# Patient Record
Sex: Male | Born: 1935 | Race: Black or African American | Hispanic: No | Marital: Single | Smoking: Former smoker
Health system: Southern US, Community
[De-identification: ages and names within clinical notes are randomized; demographics above are authoritative.]

## PROBLEM LIST (undated history)

## (undated) DIAGNOSIS — N400 Enlarged prostate without lower urinary tract symptoms: Secondary | ICD-10-CM

## (undated) DIAGNOSIS — E785 Hyperlipidemia, unspecified: Secondary | ICD-10-CM

## (undated) DIAGNOSIS — R001 Bradycardia, unspecified: Secondary | ICD-10-CM

## (undated) DIAGNOSIS — R39198 Other difficulties with micturition: Secondary | ICD-10-CM

## (undated) DIAGNOSIS — I10 Essential (primary) hypertension: Secondary | ICD-10-CM

## (undated) DIAGNOSIS — R0602 Shortness of breath: Secondary | ICD-10-CM

## (undated) DIAGNOSIS — C439 Malignant melanoma of skin, unspecified: Secondary | ICD-10-CM

## (undated) DIAGNOSIS — I639 Cerebral infarction, unspecified: Secondary | ICD-10-CM

## (undated) DIAGNOSIS — M1990 Unspecified osteoarthritis, unspecified site: Secondary | ICD-10-CM

## (undated) DIAGNOSIS — I2510 Atherosclerotic heart disease of native coronary artery without angina pectoris: Secondary | ICD-10-CM

## (undated) DIAGNOSIS — I70229 Atherosclerosis of native arteries of extremities with rest pain, unspecified extremity: Secondary | ICD-10-CM

## (undated) DIAGNOSIS — H409 Unspecified glaucoma: Secondary | ICD-10-CM

## (undated) DIAGNOSIS — I499 Cardiac arrhythmia, unspecified: Secondary | ICD-10-CM

## (undated) DIAGNOSIS — I998 Other disorder of circulatory system: Secondary | ICD-10-CM

## (undated) HISTORY — DX: Other disorder of circulatory system: I99.8

## (undated) HISTORY — PX: CATARACT EXTRACTION: SUR2

## (undated) HISTORY — DX: Atherosclerosis of native arteries of extremities with rest pain, unspecified extremity: I70.229

---

## 2001-07-31 ENCOUNTER — Ambulatory Visit (HOSPITAL_COMMUNITY): Admission: RE | Admit: 2001-07-31 | Discharge: 2001-07-31 | Payer: Self-pay | Admitting: Gastroenterology

## 2001-08-20 ENCOUNTER — Encounter: Admission: RE | Admit: 2001-08-20 | Discharge: 2001-08-20 | Payer: Self-pay | Admitting: Gastroenterology

## 2001-08-20 ENCOUNTER — Encounter: Payer: Self-pay | Admitting: Gastroenterology

## 2001-12-20 ENCOUNTER — Encounter: Payer: Self-pay | Admitting: Emergency Medicine

## 2001-12-20 ENCOUNTER — Emergency Department (HOSPITAL_COMMUNITY): Admission: EM | Admit: 2001-12-20 | Discharge: 2001-12-20 | Payer: Self-pay | Admitting: Emergency Medicine

## 2001-12-30 ENCOUNTER — Ambulatory Visit (HOSPITAL_COMMUNITY): Admission: RE | Admit: 2001-12-30 | Discharge: 2001-12-30 | Payer: Self-pay | Admitting: Specialist

## 2002-01-02 HISTORY — PX: OTHER SURGICAL HISTORY: SHX169

## 2002-01-09 ENCOUNTER — Encounter: Payer: Self-pay | Admitting: Internal Medicine

## 2002-01-09 ENCOUNTER — Encounter: Admission: RE | Admit: 2002-01-09 | Discharge: 2002-01-09 | Payer: Self-pay | Admitting: Internal Medicine

## 2002-01-11 ENCOUNTER — Encounter: Admission: RE | Admit: 2002-01-11 | Discharge: 2002-01-11 | Payer: Self-pay | Admitting: Internal Medicine

## 2002-01-11 ENCOUNTER — Encounter: Payer: Self-pay | Admitting: Internal Medicine

## 2002-05-21 HISTORY — PX: NM MYOCAR PERF WALL MOTION: HXRAD629

## 2002-07-03 HISTORY — PX: CARDIAC CATHETERIZATION: SHX172

## 2002-07-04 ENCOUNTER — Emergency Department (HOSPITAL_COMMUNITY): Admission: EM | Admit: 2002-07-04 | Discharge: 2002-07-04 | Payer: Self-pay | Admitting: Emergency Medicine

## 2002-09-18 ENCOUNTER — Inpatient Hospital Stay (HOSPITAL_COMMUNITY): Admission: EM | Admit: 2002-09-18 | Discharge: 2002-09-20 | Payer: Self-pay | Admitting: Emergency Medicine

## 2002-09-19 ENCOUNTER — Encounter (INDEPENDENT_AMBULATORY_CARE_PROVIDER_SITE_OTHER): Payer: Self-pay | Admitting: Cardiology

## 2002-09-19 HISTORY — PX: CARDIAC CATHETERIZATION: SHX172

## 2002-12-04 HISTORY — PX: NM MYOCAR PERF WALL MOTION: HXRAD629

## 2002-12-15 ENCOUNTER — Ambulatory Visit (HOSPITAL_COMMUNITY): Admission: RE | Admit: 2002-12-15 | Discharge: 2002-12-15 | Payer: Self-pay | Admitting: Specialist

## 2006-07-16 ENCOUNTER — Emergency Department (HOSPITAL_COMMUNITY): Admission: EM | Admit: 2006-07-16 | Discharge: 2006-07-16 | Payer: Self-pay | Admitting: Emergency Medicine

## 2009-09-09 HISTORY — PX: NM MYOCAR PERF WALL MOTION: HXRAD629

## 2009-09-28 HISTORY — PX: DOPPLER ECHOCARDIOGRAPHY: SHX263

## 2010-05-20 NOTE — Consult Note (Signed)
NAME:  Craig Martin, PETTIE NO.:  [id number]   MEDICAL RECORD NO.:  [id number]                   PATIENT TYPE:  EMS   LOCATION:  MAJO                                 FACILITY:  [facility]   PHYSICIAN:  Ralene Ok, M.D.                   DATE OF BIRTH:  [birth year]   DATE OF CONSULTATION:  12/20/2001  DATE OF DISCHARGE:                                   CONSULTATION   HISTORY OF PRESENT ILLNESS:  The patient is a 77 year old African-American  male who was initially seen at [facility] when he was found to  have slurred speech, weakness of his left hand, and possible gaze  abnormality, and said to have diagnosis of CVA.  The patient was placed on  aspirin 325 mg.  On arrival to the emergency room, the patient was seen by  Dr. Lynelle Doctor, who did not find any evidence of facial or ocular abnormality.  The patient's speech was not slurred and rather slow.  The patient did,  however, show some left weakness.  Consult was requested.   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  The patient has been  followed without any deficit,  had been working on a regular basis.  He also  has a past medical history of hypertension and diabetes mellitus,  hyperlipidemia.   CURRENT MEDICATIONS:  1. Hydrochlorothiazide 50 mg.  2. Lopressor 100 mg.  3. Altace 10 mg.  4. Potassium 20 mEq.  5. Glucotrol 10 mg.  6. Adalat 30 mg.  7. Glucophage 1000 mg.  8. Aspirin daily.   The patient is a rather vague historian and does admit to some weakness of  the left hand.  He says it has been rather gradual with no sudden weakness.  The weakness of the left hand was accompanied with some numbness and  tingling and was evaluated by Dr. Barbee Shropshire.  The patient also admits to some  pain in his neck.  He denies any headaches or visual disturbance.  No  history of diplopia.  The patient says his speech has remained constant, has  not noticed any facial asymmetry, and there  is no difficulty walking.  He  denies any chest pain, shortness of breath, no dizzy spells.  His balance  locution are normal.  He denies any abdominal  pain, and he has no history  of previous foot ulcers.  The patient's skin is normal.  The patient is a  nonsmoker and does not drink any alcohol.  The patient denies any known drug  allergies.   The patient was unable to say if his blood sugars have been well controlled  with his Glucophage and was unable to name all of his medications.  By the  chart, when the patient when in for a colonoscopy by Dr. Loreta Ave, states his  medications.   PHYSICAL EXAMINATION:  GENERAL:  The  patient is alert, oriented x 3.  HEENT:  There is no facial asymmetry.  Pupils are equal, round, and reactive  to light. Eyelids are normal.  There is no evidence of visual field deficit  and no evidence of diplopia.  Ear, nose, and throat appeared normal.  NECK:  Supple.  NEUROLOGIC:  Tongue was normal with no deviation.  Gag reflex was normal.  Examination of the right side grade 4, 4+ on the left side, especially in  the hand grip.  There is definite wasting of the small muscles of the left  calf.  No sensory loss noted and no fasciculations noted.  Palpation of the  elbow showed no evidence of arthritis.  Examination of the lower limbs  showed normal power and tone was also normal.  Reflexes were brisk on the  right side but plantars were flexor.  Tough with fiber was normal.  Gait was  not tested.  HEART:  Auscultation of his heart showed normal heart tones.  Rate was 70  per minute, regular, and no bruits or murmurs noted.  CHEST:  Medically clear.  ABDOMEN:  Soft, nontender.  Liver and spleen were not palpable.  RECTAL/GENITAL:  Deferred.  BACK:  No tenderness in cervical or lumbar spine.   IMPRESSION:  1. Weakness of the left hand with small muscle wasting suggesting no injury.     Need to rule out cervical myelopathy versus  neuropathy in the arm or      elbow.  No evidence of recent cerebrovascular accident on clinical exam,     and recent CT scan of the head shows no acute changes.  2. Known hypertension with normal blood pressure on this visit.  3. History of diabetes mellitus.  4. History of hyperlipidemia.   An EKG done in the emergency room shows normal sinus rhythm.  There is a  left axis deviation with no acute changes.   PLAN:  The patient will be discharged home. Will add Plavix to his  medications; however, the patient will need EEG and EMG evaluation of the  left upper lip to evaluate for peripheral nerve disease versus myelopathy.  The patient says he has a followup with Dr. Barbee Shropshire to see him in the  outpatient setting, and we will follow up with him.                                                Ralene Ok, M.D.    RM/MEDQ  D:  12/20/2001  T:  12/22/2001  Job:  [id number]

## 2010-05-20 NOTE — Discharge Summary (Signed)
Craig Martin, Craig Martin                         ACCOUNT NO.:  [id number]   MEDICAL RECORD NO.:  [id number]                   PATIENT TYPE:  INP   LOCATION:  6527                                 FACILITY:  [facility]   PHYSICIAN:  Nanetta Martin, M.D.                DATE OF BIRTH:  [birth year]   DATE OF ADMISSION:  09/18/2002  DATE OF DISCHARGE:  09/20/2002                                 DISCHARGE SUMMARY   DISCHARGE DIAGNOSES:  1. Coronary artery disease, status post catheterization with intervention,     this admission.  2. Hypertension.  3. Hyperlipidemia.  4. Non-insulin-dependent diabetes mellitus.  5. Paroxysmal atrial fibrillation, on Coumadin.  6. Normal left ventricular function.  7. History of cerebrovascular accident.   HISTORY OF PRESENT ILLNESS AND HOSPITAL COURSE:  This is a 77 year old  African American male who has a prior history of coronary artery disease,  who presented to the emergency room with complaints of pain in the left  axillary region.  He started noticing the discomfort approximately three  days ago, prior to his presentation, and also mentioned that he has not done  any upper extremity physical work to kind of account from that.  He denied  any increase in dyspnea on exertion and says that he feels more short of  breath most of the time, but it did not get worse.  He did not have a good  sleep last night secondary to pain, but there was no diaphoresis, nausea,  vomiting or, as I mentioned, dyspnea exacerbation.  At the time of  presentation, the patient did not have any complaints, but he contacted his  primary physician's office, Dr. Olene Martin, and he sent him for an  evaluation to the emergency room.   The patient was admitted to [facility] on a rule-out MI protocol  and underwent cardiac catheterization on September 19, 2002.  This  catheterization was performed by Dr. Darlin Priestly and it showed high-  grade lesion in the  proximal-to-mid-portion of the circumflex artery and  that section was stented with a ZETA stent and the patient tolerated the  procedure well and was transferred to the holding area and later to the post-  catheterization unit in a stable condition.   Blood work showed: CBC -- white blood cell count 4.5, hemoglobin 11.3,  hematocrit 32.4, platelet count 163,000; sodium 139, potassium 4.1, chloride  109, CO2 27, glucose 98, BUN 13, creatinine 1.0, calcium 8.8.  Lipid profile  showed total cholesterol 109, triglycerides 54, HDL 48, LDL 50.  Cardiac  enzymes negative x3.  Thyroid status within normal limits.   On the day of his discharge, he was assessed by Dr. Gaspar Garbe B. Little, found  to be stable from cardiovascular standpoint and discharged home with the  following recommendations:   RECOMMENDATIONS AND FOLLOWUP:  1. Avoid driving, strenuous activity and lifting greater than 5 pounds  for     three days post catheterization.  2. Continue low-salt and low-cholesterol diet.  3. He was allowed to shower, instructed to avoid rubbing about puncture site     and pat it dry.  4. He is to stop Coumadin while he is on Plavix and then after Plavix     discontinued, he may resume his Coumadin.  5. Followup with Craig Martin will be scheduled and office will     contact the patient with the appointment date.   DISCHARGE MEDICATIONS:  1. Lopressor 50 mg a b.i.d.  2. Hydrochlorothiazide 50 mg daily.  3. Kay Ciel 20 mEq daily.  4. Lipitor 10 mg daily.  5. Aspirin 81 mg daily.  6. Plavix 75 mg daily.  7. Altace 10 mg b.i.d.  8. Eye drops as above.   SPECIAL DISCHARGE INSTRUCTIONS:  The patient is to stop nifedipine.   He is to be on Plavix for one month and during that time, he will be  maintained off of Coumadin.      Craig Martin, P.A.                    Nanetta Martin, M.D.    MK/MEDQ  D:  10/21/2002  T:  10/21/2002  Job:  [id number]   cc:   Craig Martin, M.D.  [address]  [address]  [place]  [place] [address]  Fax: [phone]   Nanetta Martin, M.D.  [phone], [address]  [place]  [place] [address]  Fax: [phone]

## 2010-05-20 NOTE — Cardiovascular Report (Signed)
Craig Martin, Craig Martin                         ACCOUNT NO.:  [id number]   MEDICAL RECORD NO.:  [id number]                   PATIENT TYPE:  INP   LOCATION:  6527                                 FACILITY:  [facility]   PHYSICIAN:  Darlin Priestly, M.D.             DATE OF BIRTH:  23-Feb-1926   DATE OF PROCEDURE:  09/19/2002  DATE OF DISCHARGE:                              CARDIAC CATHETERIZATION   PROCEDURES PERFORMED:  1. Left heart catheterization.  2. Coronary angiography.  3. Left ventriculogram.  4. Left circumflex-proximal percutaneous transluminal coronary balloon     angioplasty-placement of a new coronary stent.   CARDIOLOGIST:  Darlin Priestly, M.D.   COMPLICATIONS:  None.   INDICATIONS:  Craig Martin is a 77 year old male, patient of Dr. Nanetta Batty and Dr. Garner Nash, with a history of cardiac catheterization by Dr.  Allyson Sabal on July 03, 2002 revealing scattered 60% lesions in the LAD as well as  a 90% proximal circumflex lesion.  He did have a follow up Cardiolite with  no significant ischemia.  He has been treated medically.  He has remained  asymptomatic.  He presented to [facility] office on September 18, 2002  with new onset atrial fibrillation and is now referred for repeat  catheterization with probable percutaneous intervention of the circumflex.   DESCRIPTION OF PROCEDURE:  After obtaining written informed consent the  patient was brought to the cardiac cath lab where the right and left groins  were shaved, prepped and draped in the usual sterile fashion.  ECG  monitoring was established.  Using modified Seldinger technique a #6 French  arterial sheath was inserted into the right femoral artery.  Six French  diagnostic catheter was used to perform diagnostic angiography.   This revealed a large left main with no significant disease.   The LAD is a large vessel, which courses the apex and gives rise to one  large diagonal branch.  The LAD was noted to  have sequential 60 and 40%  lesions in the mid LAD.  There is a 90% apical LAD lesion.  There is a large  first diagonal, which is calcified with an 80% ostial lesion.   The left circumflex is a large vessel coursing the AV groove and gives rise  to two obtuse marginal branches.  The AV groove was noted to be diffusely  calcified in its proximal portion.  There is a 90% proximal left circumflex  lesion prior to the takeoff of the first diagonal.  There is 30% diffuse  stenosis throughout the remainder of the AV groove circumflex.   The first OM is a medium-sized vessel with a 50% ostial lesion.  The second  OM is a medium-sized vessel, which is irregular, but has no high-grade  stenosis.   The right coronary artery is a large vessel, which is dominant.  It gives  rise to both the PDA as well as  a posterolateral branch.  The RCA is noted  to have diffuse 30% proximal disease.  The distal RCA is noted to have a 60%  stenotic lesion.  PDA and posterolateral branch have no significant disease.   Left ventriculogram reveals low-normal EF at 50% with anterolateral  hypokinesis.   Hemodynamics:  Systemic arterial pressure 177/76.  LV systemic pressure  178/4.  LVEDP of 12.   INTERVENTIONAL PROCEDURE:  Left Circumflex-Proximal:  Following diagnostic  angiography the 6 French sheath was then exchanged for a #7 [place] sheath.  A #7 [place] Voda 4.0 guiding catheter with side holes was then engaged in  the left coronary ostium.  Next, a 0.014 Forte guide wire was advanced down  the guiding catheter and measurements made in the proximal circumflex.  The  guide wire was then positioned in the second OM without difficulty.  Next, a  Maverick 2.5 x 15 mm balloon was then tracked across the mid AV groove  circumflex lesion.  One inflation to a maximum of 10 atmospheres was then  performed for a total of 63 seconds.  It should be noted that there was  significant wasting in the mid part of the  balloon; however, it did appear  to be fully opened at 10 atmospheres.  This balloon was then removed and a  Guidant Multi-Link Zeta 2.75 x 13 mm stent was then positioned across the  proximal AV groove circumflex lesion.  This stent was then deployed to a  maximum of 14 atmospheres for a total of 64 seconds.  Follow up angiogram  revealed no evidence of dissection or thrombus with TIMI III flow to the  distal vessel.  IV Angiomax was used throughout the case.   Final orthogonal angiograms revealed less than 10% residual stenosis in the  proximal AV groove circumflex lesion with TIMI III flow to the distal  vessel.  At this point we elected to conclude the procedure.  All balloons,  wires and catheters were removed. Hemostatic sheaths were sewn into placed  and the patient was taken back to the ward in stable condition.   CONCLUSIONS:  1. Successful percutaneous transluminal coronary balloon angioplasty and     placement of a Zeta 2.75 x 13 millimeter stent in the proximal AV groove     circumflex stenotic lesion.  2. Low-normal ejection fraction.  3. Systemic hypertension.  4. Adjuvant use of Angiomax infusion.                                                 Darlin Priestly, M.D.    RHM/MEDQ  D:  09/19/2002  T:  09/20/2002  Job:  [id number]   cc:   Nanetta Batty, M.D.  [address]  [place]  [place] [address]  Fax: [phone]   Olene Craven, M.D.  [address]  [address]  [place]  [place] [address]  Fax: [phone]

## 2010-05-20 NOTE — Op Note (Signed)
   NAME:  Craig Martin, Craig Martin                         ACCOUNT NO.:  [id number]   MEDICAL RECORD NO.:  [id number]                   PATIENT TYPE:  AMB   LOCATION:  ENDO                                 FACILITY:  [facility]   PHYSICIAN:  Charna Elizabeth, M.D.                   DATE OF BIRTH:  [birth year]   DATE OF PROCEDURE:  07/31/2001  DATE OF DISCHARGE:                                 OPERATIVE REPORT   PROCEDURE:  Colonoscopy.   ENDOSCOPIST:  Charna Elizabeth, M.D.   INSTRUMENT USED:  Olympus video colonoscope   INDICATIONS FOR PROCEDURE:  A 77 year old African-American male with a  history of 20 pound weight loss, rule out colonic polyps, masses,  hemorrhoids, etc.   PREPROCEDURE PREPARATION:  Informed consent was procured from the patient.  The patient fasted for eight hours prior to the procedure and was prepped  with a bottle of magnesium citrate and a gallon of NuLytely the night prior  to the procedure.   PREPROCEDURE HISTORY AND PHYSICAL:  The patient had stable vital signs. Neck  supple. Chest clear to auscultation. S1, S2 regular. Abdomen soft with  normal bowel sounds.   DESCRIPTION OF PROCEDURE:  The patient was placed in the left lateral  decubitus position and sedated with 50 mg of Demerol and 5 mg of Versed  intravenously. Once the patient was adequately sedated and maintained on low  flow oxygen and continuous cardiac monitoring, the Olympus video colonoscope  was advanced from the rectum to the cecum without difficulty. The entire  colonic mucosa appeared healthy with no masses, polyps, erosions,  ulcerations or diverticulosis. Small internal hemorrhoids were seen on  retroflexion. The patient had a large amount of stool in the colon ,  therefore multiple small lesions could have been missed.   IMPRESSION:  1. Essentially unrevealing colonoscopy. No masses, polyps or diverticulosis.  2. Small internal hemorrhoids.  3. _________ small lesions could have been missed.   RECOMMENDATIONS:  Outpatient follow-up for further instructions.                                               Charna Elizabeth, M.D.    JM/MEDQ  D:  07/31/2001  T:  08/05/2001  Job:  [id number]   cc:   Olene Craven, M.D.

## 2010-07-27 ENCOUNTER — Inpatient Hospital Stay (HOSPITAL_COMMUNITY)
Admission: EM | Admit: 2010-07-27 | Discharge: 2010-07-29 | DRG: 728 | Disposition: A | Discharge status: Home / Self Care | Payer: Medicare Other | Source: Ambulatory Visit | Attending: Internal Medicine | Admitting: Internal Medicine

## 2010-07-27 ENCOUNTER — Emergency Department (HOSPITAL_COMMUNITY): Payer: Medicare Other

## 2010-07-27 DIAGNOSIS — N433 Hydrocele, unspecified: Secondary | ICD-10-CM | POA: Diagnosis present

## 2010-07-27 DIAGNOSIS — E785 Hyperlipidemia, unspecified: Secondary | ICD-10-CM | POA: Diagnosis present

## 2010-07-27 DIAGNOSIS — A498 Other bacterial infections of unspecified site: Secondary | ICD-10-CM | POA: Diagnosis present

## 2010-07-27 DIAGNOSIS — Z8673 Personal history of transient ischemic attack (TIA), and cerebral infarction without residual deficits: Secondary | ICD-10-CM

## 2010-07-27 DIAGNOSIS — N390 Urinary tract infection, site not specified: Secondary | ICD-10-CM | POA: Diagnosis present

## 2010-07-27 DIAGNOSIS — I861 Scrotal varices: Secondary | ICD-10-CM | POA: Diagnosis present

## 2010-07-27 DIAGNOSIS — N189 Chronic kidney disease, unspecified: Secondary | ICD-10-CM | POA: Diagnosis present

## 2010-07-27 DIAGNOSIS — N419 Inflammatory disease of prostate, unspecified: Secondary | ICD-10-CM | POA: Diagnosis present

## 2010-07-27 DIAGNOSIS — N400 Enlarged prostate without lower urinary tract symptoms: Secondary | ICD-10-CM | POA: Diagnosis present

## 2010-07-27 DIAGNOSIS — D696 Thrombocytopenia, unspecified: Secondary | ICD-10-CM | POA: Diagnosis present

## 2010-07-27 DIAGNOSIS — Z7982 Long term (current) use of aspirin: Secondary | ICD-10-CM

## 2010-07-27 DIAGNOSIS — E119 Type 2 diabetes mellitus without complications: Secondary | ICD-10-CM | POA: Diagnosis present

## 2010-07-27 DIAGNOSIS — I129 Hypertensive chronic kidney disease with stage 1 through stage 4 chronic kidney disease, or unspecified chronic kidney disease: Secondary | ICD-10-CM | POA: Diagnosis present

## 2010-07-27 DIAGNOSIS — I252 Old myocardial infarction: Secondary | ICD-10-CM

## 2010-07-27 DIAGNOSIS — E860 Dehydration: Secondary | ICD-10-CM | POA: Diagnosis present

## 2010-07-27 DIAGNOSIS — D649 Anemia, unspecified: Secondary | ICD-10-CM | POA: Diagnosis present

## 2010-07-27 DIAGNOSIS — N179 Acute kidney failure, unspecified: Secondary | ICD-10-CM | POA: Diagnosis present

## 2010-07-27 DIAGNOSIS — E876 Hypokalemia: Secondary | ICD-10-CM | POA: Diagnosis present

## 2010-07-27 DIAGNOSIS — Z7902 Long term (current) use of antithrombotics/antiplatelets: Secondary | ICD-10-CM

## 2010-07-27 DIAGNOSIS — N453 Epididymo-orchitis: Principal | ICD-10-CM | POA: Diagnosis present

## 2010-07-27 LAB — AFP TUMOR MARKER: AFP-Tumor Marker: <1.3 ng/mL (ref 0.0–8.0)

## 2010-07-27 LAB — DIFFERENTIAL
Basophils Absolute: 0 10*3/uL (ref 0.0–0.1)
Basophils Relative: 0 % (ref 0–1)
Eosinophils Absolute: 0 10*3/uL (ref 0.0–0.7)
Eosinophils Relative: 0 % (ref 0–5)
Lymphocytes Relative: 4 % — ABNORMAL LOW (ref 12–46)
Lymphs Abs: 0.2 10*3/uL — ABNORMAL LOW (ref 0.7–4.0)
Monocytes Absolute: 0.3 10*3/uL (ref 0.1–1.0)
Monocytes Relative: 4 % (ref 3–12)
Neutro Abs: 5.7 10*3/uL (ref 1.7–7.7)
Neutrophils Relative %: 92 % — ABNORMAL HIGH (ref 43–77)

## 2010-07-27 LAB — COMPREHENSIVE METABOLIC PANEL
Albumin: 3.1 g/dL — ABNORMAL LOW (ref 3.5–5.2)
BUN: 37 mg/dL — ABNORMAL HIGH (ref 6–23)
Calcium: 8.4 mg/dL (ref 8.4–10.5)
Creatinine, Ser: 2.38 mg/dL — ABNORMAL HIGH (ref 0.50–1.35)
GFR calc Af Amer: 32 mL/min — ABNORMAL LOW (ref >60)
Glucose, Bld: 192 mg/dL — ABNORMAL HIGH (ref 70–99)
Potassium: 2.8 mEq/L — ABNORMAL LOW (ref 3.5–5.1)
Total Protein: 7.5 g/dL (ref 6.0–8.3)

## 2010-07-27 LAB — HEMOGLOBIN A1C: Mean Plasma Glucose: 163 mg/dL — ABNORMAL HIGH (ref <117)

## 2010-07-27 LAB — CBC
HCT: 26.9 % — ABNORMAL LOW (ref 39.0–52.0)
Hemoglobin: 9.3 g/dL — ABNORMAL LOW (ref 13.0–17.0)
MCH: 30.2 pg (ref 26.0–34.0)
MCHC: 34.6 g/dL (ref 30.0–36.0)
RDW: 13.8 % (ref 11.5–15.5)

## 2010-07-27 LAB — URINALYSIS, ROUTINE W REFLEX MICROSCOPIC
Bilirubin Urine: NEGATIVE
Nitrite: NEGATIVE
Specific Gravity, Urine: 1.017 (ref 1.005–1.030)
Urobilinogen, UA: 1 mg/dL (ref 0.0–1.0)
pH: 5.5 (ref 5.0–8.0)

## 2010-07-27 LAB — URINE MICROSCOPIC-ADD ON

## 2010-07-27 LAB — GLUCOSE, CAPILLARY: Glucose-Capillary: 141 mg/dL — ABNORMAL HIGH (ref 70–99)

## 2010-07-27 LAB — PSA: PSA: 17.05 ng/mL — ABNORMAL HIGH (ref <=4.00)

## 2010-07-27 LAB — LIPASE, BLOOD: Lipase: 31 U/L (ref 11–59)

## 2010-07-27 LAB — LACTATE DEHYDROGENASE: LDH: 191 U/L (ref 94–250)

## 2010-07-27 NOTE — H&P (Signed)
NAME:  HEMI, CHACKO NO.:  [id number]  MEDICAL RECORD NO.:  [id number]  LOCATION:  [facility]                         FACILITY:  [facility]  PHYSICIAN:  Talmage Nap, MD  DATE OF BIRTH:  07-24-1926  DATE OF ADMISSION:  07/27/2010 DATE OF DISCHARGE:                             HISTORY & PHYSICAL   PRIMARY CARE PHYSICIAN:  [facility].  The history obtainable from the patient.  CHIEF COMPLAINT:  Left testicular pain of about 3 days duration.  The patient is an 77 year old African American male with history of CVA, no neuro deficits, prior MI, and diabetes mellitus, presenting to the emergency room with testicular pain which started suddenly of about 3 days duration and is said to be getting progressively worse.  The patient claimed that he had been in stable health until about 3 days prior to presenting to the emergency room.  He developed sudden pain in his left testicle with associated swelling.  He, however, denied any history of trauma.  He denied any associated systemic symptoms.No fever.  No chills.  No rigor.  He also denied any associated abdominal pain.  He denied any dysuria or hematuria.  He however observed that the left testicular pain was getting progressively worse and was also swelling, and subsequently was brought to the emergency room to be evaluated.  PAST MEDICAL HISTORY: 1. Positive for hypertension. 2. Diabetes mellitus. 3. Atrial fibrillation. 4. CVA, no neuro deficits. 5. Hyperlipidemia. 6. Prior MI.  PAST SURGICAL HISTORY:  None.  PREADMISSION MEDICATIONS: 1. Aspirin 81 mg one p.o. daily. 2. Timolol ophthalmic solution 0.5% one drop to both eyes daily. 3. Tamsulosin (Flomax) 0.4 mg one p.o. daily. 4. Bactrim Double Strength one p.o. b.i.d. 5. Ramipril 5 mg one p.o. daily. 6. Metoprolol tartrate 50 mg one p.o. b.i.d. 7. Lumigan (bimatoprost) 0.03% applied both eyes daily at bedtime. 8. Isosorbide mononitrate  SR (Imdur) 60 mg one p.o. daily. 9. Hydrocodone/APAP 5/500 one q.4 p.r.n. 10.Plavix (clopidogrel) 75 mg one p.o. daily.  ALLERGIES:  Has no known allergies.  SOCIAL HISTORY:  Negative for alcohol or tobacco use.  Lives at home with friends and family members and is currently on disability.  FAMILY HISTORY:  York Spaniel to be positive for hypertension and diabetes mellitus.  REVIEW OF SYSTEMS:  The patient denies any history of headaches.  No blurry vision.  Complained about nausea with multiple episodes of vomiting about 3 that occurred in the emergency room.  He denied any associated chest pain or shortness of breath.  No cough.  Denied any PND or orthopnea.  No abdominal discomfort.  No diarrhea or hematochezia. Complained about pain in the left testicle, but denied any associated dysuria or hematuria.  There is no swelling in the lower extremity.  No intolerance to heat or cold.  No neuropsychiatric disorder.  PHYSICAL EXAMINATION:  GENERAL:  Elderly man, not in any respiratory distress, dehydrated is present. VITAL SIGNS:  Blood pressure is 112/56, pulse is 93, respiratory rate is 16, temperature is 100.9. HEENT:  Mild pallor, but pupils are reactive to light and extraocular muscles are intact.  Ears also has pinna occluded. NECK:  No jugular venous distention.  No carotid  bruit.  No lymphadenopathy. CHEST:  Clear to auscultation. HEART:  Sounds are 1 and 2. ABDOMEN:  Soft, nontender.  Liver, spleen, and kidney not palpable. Bowel sounds are positive. EXTREMITIES:  No pedal edema. NEUROLOGIC:  Nonfocal. PELVIC:  Showed swollen left testicle, tender to touch.  No areas of erythema, difficult to get above the swelling.  There is no transilluminate.  LAB DATA:  Urine microscopy showed epithelial cells few, urine wbc 21- 50, urine rbc 3-6, and urine microscopy showed urine nitrite negative with large leukocyte esterase.  Comprehensive metabolic panel showed sodium of 135, potassium  of 2.8, chloride of 101 with a bicarb of 22, glucose is 192, BUN is 37, creatinine is 2.38.  LFT normal.  Lipase is 31.  Hematological indices showed WBC of 6.3, hemoglobin of 9.3, hematocrit of 26.9, MCV of 87.3 with a platelet count of 129, neutrophil is 92% and absolute neutrophil count is 5.7.  Imaging studies done include ultrasound of the scrotum, which showed right testis measured 4.1 x 2.0 x 3.6 cm with increased color flow, left testis measures 3.9 x 2.9 x 3.8, there is also increased color flow as well.  Right epididymis very prominent, measured 9 x 16 cm, left epididymis markedly enlarged with hypervascular, measured about 5.5 cm.  There is small hydrocele. There is also varicocele present and there is also scrotal swelling.  IMPRESSION:  Epididymitis, left greater than the right. 1. Left testicular pain most likely secondary to the epididymo-     orchitis, however, it is very important to rule out prostate and     testicular cancer. 2. Varicocele. 3. Hydrocele. 4. Dehydration. 5. Hypokalemia. 6. Acute renal failure, questionable superimposed on chronic kidney     disease. 7. Atrial fibrillation. 8. Prior myocardial infarction. 9. Cerebrovascular accident, no neuro deficit. 10.Hypertension. 11.Hyperlipidemia. 12.Thrombocytopenia.  PLAN:  To admit the patient to general medical floor.  The patient will be slowly rehydrated with normal saline with 20 mEq of KCl to go at a rate of 75 mL an hour.  He will also be on KCl 10 mEq p.o. b.i.d.  He will be treated for epididymo-orchitis with Cipro 400 mg IV q.12 and then pain control be done with Dilaudid 1 mg IV q.24 p.r.n. for pain. The patient will also be on Flomax 0.4 mg p.o. at bedtime.  Blood pressure will be maintained with Lopressor 50 mg p.o. b.i.d. and amlodipine 10 mg p.o. daily.  Ramipril will be on hold for now because of the renal insufficiency.  Other medication to be given to the patient will include Plavix 75  mg p.o. daily, Imdur 60 mg p.o. daily.  Since the patient is diabetic, he will be on Accu-Cheks t.i.d. with a.c. and at bedtime with regular insulin sliding scale (moderate scale) and he will also be given Protonix 40 mg p.o. daily for GI prophylaxis and TED stockings for DVT prophylaxis because of the thrombocytopenia.  Further workup to be done in this patient will include blood culture x2 before starting IV antibiotics, urine culture, PSA level stat, alpha- fetoprotein stat, LDH stat, hemoglobin A1c will also be done.  CBC, CMP, and magnesium will also be repeated in a.m.  Finally, Urology will be consulted for further evaluation of this patient.  The patient will be followed and evaluated on day-to-day basis.     Talmage Nap, MD     CN/MEDQ  D:  07/27/2010  T:  07/27/2010  Job:  [id number]  Electronically Signed by Talmage Nap  on 07/27/2010 07:25:16 PM

## 2010-07-28 ENCOUNTER — Inpatient Hospital Stay (HOSPITAL_COMMUNITY): Payer: Medicare Other

## 2010-07-28 LAB — COMPREHENSIVE METABOLIC PANEL
ALT: 18 U/L (ref 0–53)
AST: 30 U/L (ref 0–37)
CO2: 21 mEq/L (ref 19–32)
Chloride: 103 mEq/L (ref 96–112)
GFR calc non Af Amer: 21 mL/min — ABNORMAL LOW (ref >60)
Sodium: 136 mEq/L (ref 135–145)
Total Bilirubin: 0.6 mg/dL (ref 0.3–1.2)

## 2010-07-28 LAB — CBC
Hemoglobin: 8.9 g/dL — ABNORMAL LOW (ref 13.0–17.0)
MCV: 87.7 fL (ref 78.0–100.0)
Platelets: 150 10*3/uL (ref 150–400)
RBC: 2.92 MIL/uL — ABNORMAL LOW (ref 4.22–5.81)
WBC: 8.3 10*3/uL (ref 4.0–10.5)

## 2010-07-28 LAB — DIFFERENTIAL
Basophils Relative: 0 % (ref 0–1)
Eosinophils Absolute: 0.3 10*3/uL (ref 0.0–0.7)
Lymphs Abs: 0.7 10*3/uL (ref 0.7–4.0)
Neutro Abs: 6.4 10*3/uL (ref 1.7–7.7)
Neutrophils Relative %: 77 % (ref 43–77)

## 2010-07-28 LAB — GLUCOSE, CAPILLARY: Glucose-Capillary: 181 mg/dL — ABNORMAL HIGH (ref 70–99)

## 2010-07-29 ENCOUNTER — Inpatient Hospital Stay (HOSPITAL_COMMUNITY): Payer: Medicare Other

## 2010-07-29 LAB — COMPREHENSIVE METABOLIC PANEL
Albumin: 2.6 g/dL — ABNORMAL LOW (ref 3.5–5.2)
Alkaline Phosphatase: 43 U/L (ref 39–117)
BUN: 41 mg/dL — ABNORMAL HIGH (ref 6–23)
Calcium: 8.1 mg/dL — ABNORMAL LOW (ref 8.4–10.5)
GFR calc Af Amer: 30 mL/min — ABNORMAL LOW (ref >60)
Glucose, Bld: 128 mg/dL — ABNORMAL HIGH (ref 70–99)
Potassium: 3.5 mEq/L (ref 3.5–5.1)
Sodium: 135 mEq/L (ref 135–145)
Total Protein: 6.7 g/dL (ref 6.0–8.3)

## 2010-07-29 LAB — CBC
MCH: 29.6 pg (ref 26.0–34.0)
MCV: 86.6 fL (ref 78.0–100.0)
Platelets: 135 10*3/uL — ABNORMAL LOW (ref 150–400)
RBC: 2.77 MIL/uL — ABNORMAL LOW (ref 4.22–5.81)
RDW: 14 % (ref 11.5–15.5)
WBC: 7.2 10*3/uL (ref 4.0–10.5)

## 2010-07-29 LAB — DIFFERENTIAL
Basophils Relative: 0 % (ref 0–1)
Eosinophils Absolute: 0.4 10*3/uL (ref 0.0–0.7)
Eosinophils Relative: 6 % — ABNORMAL HIGH (ref 0–5)
Neutrophils Relative %: 65 % (ref 43–77)

## 2010-07-29 LAB — URINE CULTURE: Colony Count: 60000

## 2010-07-29 LAB — GLUCOSE, CAPILLARY
Glucose-Capillary: 134 mg/dL — ABNORMAL HIGH (ref 70–99)
Glucose-Capillary: 235 mg/dL — ABNORMAL HIGH (ref 70–99)

## 2010-07-29 LAB — MAGNESIUM: Magnesium: 1.7 mg/dL (ref 1.5–2.5)

## 2010-07-29 NOTE — Discharge Summary (Signed)
Craig Martin, Craig Martin NO.:  [id number]  MEDICAL RECORD NO.:  [id number]  LOCATION:  5529                         FACILITY:  [facility]  PHYSICIAN:  Talmage Nap, MD  DATE OF BIRTH:  10/25/1926  DATE OF ADMISSION:  07/27/2010 DATE OF DISCHARGE:  07/29/2010                        DISCHARGE SUMMARY - REFERRING   CONSULTANT INVOLVING THE CASE:  Urology, Jerilee Field, MD.  DISCHARGE DIAGNOSES: 1. Left epididymal orchitis. 2. Questionable prostatitis. 3. Urinary tract infection. 4. Dehydration. 5. Anemia. 6. Hypertension. 7. Chronic kidney disease. 8. History of atrial fibrillation. 9. History of cerebrovascular accident, no neuro deficits. 10.Prior myocardial infarction . 11.Diabetes mellitus. 12.Hyperlipidemia. 13.Hydrocele. 14.Varicocele. 15.Hypokalemia and hypomagnesemia, both resolved. 16.Chronic kidney disease. 17.Thrombocytopenia. 18.Benign prostatic hypertrophy.  The patient is an 77 year old African American male, who was admitted to the hospital on July 27, 2010 with few days history of left testicular pain, which was said to be getting progressively worse.  He denied any history of trauma.  He denied any associated systemic symptoms, i.e. no fever, no chills, no rigor.  He also denied any abdominal pain.  He denied any dysuria or hematuria.  He however observed that the left testicular pain was getting progressively bigger and subsequently presented to the emergency room to be evaluated.  His preadmission meds include: 1. Aspirin 81 mg 1 p.o. daily. 2. Timolol ophthalmic solution 0.5% one drop, both eyes daily. 3. Tamsulosin (Flomax) 0.4% one p.o. daily. 4. Bactrim double strength 1 p.o. b.i.d. 5. Ramipril 5 mg 1 p.o. daily. 6. Metoprolol tartrate 50 mg 1 p.o. b.i.d. 7. Lumigan (bimatoprost) 0.03% applied both eyes daily at bedtime. 8. Isosorbide mononitrate SR. 9. Imdur 60 mg 1 p.o. daily. 10.Hydrocodone/PAP 5/500 one p.o. q.4  p.r.n. 11.Plavix (clopidogrel) 75 mg p.o. daily.  He has no known allergies.  No known past surgical history.  SOCIAL HISTORY:  Negative for alcohol, tobacco use.  The patient lives at home with friends and family member, is currently on disability.  FAMILY HISTORY:  York Spaniel to be positive for diabetes mellitus and hypertension.  REVIEW OF SYSTEMS:  Essentially documented in the initial history and physical.  PHYSICAL EXAMINATION:  GENERAL:  At the time the patient was seen by me, he was not in any distress.  He was dehydrated. VITAL SIGNS:  Blood pressure 112/56, pulse is 93, respiratory rate 16, temperature is 100.9.  HEENT:  Mild pallor.  Pupils are reactive to light.  Extraocular muscles are intact. NECK:  No jugular venous distention.  No carotid bruits.  No lymphadenopathy. CHEST:  Clear to auscultation. HEART:  Heart sounds are 1 and 2. ABDOMEN:  Soft, nontender.  Liver, spleen, kidney not palpable.  Bowel sounds are positive.  EXTREMITIES:  No pedal edema. NEUROLOGIC:  Nonfocal. PELVIC:  Swollen left testicle, tender to touch.  There are no areas of erythema.  It was difficult to get above of the swelling and it does not transilluminate.  LABORATORY DATA:  Initial complete blood count on admission showed WBC of 6.3, hemoglobin 9.3, hematocrit 26.9, MCV of 87.3 with a platelet count of 129.  Comprehensive metabolic panel showed sodium of 135, potassium of 2.8, chloride of 101 with a bicarb of 22, glucose is  192, BUN is 37, creatinine is 2.38, lipase 31.  Urinalysis showed nitrites negative, leukocyte esterase large.  Urine microscopy showed wbc 25-50. LDH 191.  PSA 17.05, most likely due to prostatitis.  Hemoglobin A1c 7.3.  Alpha-fetoprotein less than 1.3, this rule out testicular CA. Urine culture grew Escherichia coli.  Blood culture negative x2.  Repeat comprehensive metabolic panel done on July 28, 2010 showed sodium of 136, potassium of 3.4, chloride of 102, bicarb  of 21, glucose is 132, BUN is 44, creatinine is 2.91.  LFTs normal.  Magnesium level is 1.5 and a repeat comprehensive metabolic panel done on July 29, 2010 showed sodium of 135, potassium of 3.5, chloride of 104 with a bicarb of 21, glucose is 128, BUN is 41, creatinine is 2.50.  Complete blood count with differential showed WBC of 7.2, hemoglobin of 8.2, hematocrit of 24.0, MCV of 86.6 with a platelet count of 135.  Imaging studies done include ultrasound of the scrotum, which showed left epididymal enlargement and hypervascular measuring about 5.5 cm in diameter, a small left hydrocele and scrotal thickening.  Renal ultrasound showed normal kidneys and enlarged prostate gland.  Chest x- ray done on July 29, 2010, did not show any active disease.  HOSPITAL COURSE:  The patient was admitted to General Medical Floor.  He was started on IV fluid normal saline with 20 mEq of KCl to go at a rate of 75 mL an hour.  Pain control was done with Dilaudid and Zofran for nausea.  The patient was not given Lovenox because of the thrombocytopenia and DVT prophylaxis was done with TED stockings.  He was also given KCl 10 mEq p.o. b.i.d. and started on IV antibiotics Cipro 400 mg IV q.12 hours, and this was however changed to 400 mg IV q.24 because of the creatinine clearance.  Other medication given to the patient include Flomax 0.4 mg p.o. at bedtime, Lopressor 50 mg p.o. b.i.d., amlodipine 10 mg p.o. daily, Plavix 75 mg p.o. daily.  He was placed on Accu-Chek t.i.d. with regular insulin sliding scale (moderate scale).  He was also given Imdur 60 mg p.o. daily and Protonix 40 mg p.o. daily.  Ramipril was held because of the renal insufficiency.  Urology was consulted, evaluated the patient, and recommended a renal ultrasound and the result of this is as reported above and the patient is to continue antibiotics for the next 2-3 weeks and be followed in their office at [facility] in 2-3 weeks.   The patient was subsequently followed by me and he did remarkably well.  Fever subsided and the scrotal swelling resolved.  He was seen by me today.  He denies any complaint.  No fever.  No scrotal swelling.  Examination was essentially unremarkable.  His vital signs, blood pressure is 128/58, temperature is 98.5, pulse 75, respiratory rate 20, medically stable.  Plan is for the patient to be discharged home today on activity as tolerated, daily weight and fluid restriction.  He will be followed up by his primary care physician in 1-2 weeks and by the [facility] in 2-3 weeks with a phone number of [phone].  The patient is to call for appointment.  Ramipril will be discontinued because of the renal insufficiency.  Medication to be taken at home include: 1. BiDil 1 p.o. t.i.d. 2. Ciprofloxacin 250 mg p.o. b.i.d. for the next 21 days. 3. Magnesium oxide 400 mg 1 p.o. b.i.d. 4. Tylenol 650 mg 2 tablets p.o. q.6 p.r.n. for fever.  5. Aspirin 81 mg p.o. daily. 6. Hydrocodone/APAP 5/500 one p.o. q.4-6 p.r.n. 7. Isosorbide mononitrate SR 60 mg 1 p.o. daily. 8. Lumigan (bimatoprost) 0.03% 1 drop both eyes daily at bedtime. 9. Metoprolol tartrate 50 mg 1 p.o. b.i.d. 10.Plavix (clopidogrel) 75 mg 1 p.o. daily. 11.Sulfamethizole and trimethoprim, that is Bactrim double strength     800/160 one p.o. b.i.d. 12.  Tamsulosin 0.4 mg 1 capsule p.o.     daily. 12.Timolol ophthalmic solution 0.5% one drop both eyes b.i.d.     Talmage Nap, MD     CN/MEDQ  D:  07/29/2010  T:  07/29/2010  Job:  [id number]  cc:   [facility]  Electronically Signed by Talmage Nap  on 07/29/2010 04:43:32 PM

## 2010-08-02 LAB — CULTURE, BLOOD (ROUTINE X 2)
Culture  Setup Time: 201207251459
Culture: NO GROWTH

## 2010-08-10 ENCOUNTER — Emergency Department (HOSPITAL_COMMUNITY)
Admission: EM | Admit: 2010-08-10 | Discharge: 2010-08-10 | Disposition: A | Discharge status: Home / Self Care | Payer: Medicare Other | Attending: Emergency Medicine | Admitting: Emergency Medicine

## 2010-08-10 DIAGNOSIS — Z7982 Long term (current) use of aspirin: Secondary | ICD-10-CM | POA: Insufficient documentation

## 2010-08-10 DIAGNOSIS — K5641 Fecal impaction: Secondary | ICD-10-CM | POA: Insufficient documentation

## 2010-08-10 DIAGNOSIS — E119 Type 2 diabetes mellitus without complications: Secondary | ICD-10-CM | POA: Insufficient documentation

## 2010-08-10 DIAGNOSIS — Z79899 Other long term (current) drug therapy: Secondary | ICD-10-CM | POA: Insufficient documentation

## 2010-08-10 DIAGNOSIS — I4891 Unspecified atrial fibrillation: Secondary | ICD-10-CM | POA: Insufficient documentation

## 2010-08-10 DIAGNOSIS — I10 Essential (primary) hypertension: Secondary | ICD-10-CM | POA: Insufficient documentation

## 2010-08-10 DIAGNOSIS — K5900 Constipation, unspecified: Secondary | ICD-10-CM | POA: Insufficient documentation

## 2010-08-10 DIAGNOSIS — E785 Hyperlipidemia, unspecified: Secondary | ICD-10-CM | POA: Insufficient documentation

## 2010-08-10 DIAGNOSIS — I252 Old myocardial infarction: Secondary | ICD-10-CM | POA: Insufficient documentation

## 2010-08-10 DIAGNOSIS — Z8673 Personal history of transient ischemic attack (TIA), and cerebral infarction without residual deficits: Secondary | ICD-10-CM | POA: Insufficient documentation

## 2010-08-10 NOTE — Consult Note (Signed)
Craig Martin, RESNIK NO.:  [id number]  MEDICAL RECORD NO.:  [id number]  LOCATION:  5529                         FACILITY:  [facility]  PHYSICIAN:  Jerilee Field, MD   DATE OF BIRTH:  09/20/1926  DATE OF CONSULTATION:  07/28/2010 DATE OF DISCHARGE:                                CONSULTATION   CONSULT REQUESTED:  Talmage Nap, MD, for left testicular swelling.  HISTORY OF PRESENT ILLNESS:  Craig Martin is an 77 year old male with urologic history of BPH, treated with tamsulosin.  Approximately 3 days ago he developed a left testicular pain and swelling.  He denies any dysuria or hematuria.  He denies any trouble voiding.  Reports he is voiding without hesitancy and feeling empty.  The patient had no fevers or chills.  He also had no abdominal pain.  The patient was admitted due to his age and comorbidities such as diabetes mellitus and the face of a slightly increased temperature of 100.4.  He was admitted for observation and IV antibiotics.  Per the patient he feels like pain and swelling in the testicle is already improving this morning.  PAST MEDICAL HISTORY:  Hypertension, diabetes mellitus, AFib, stroke, hyperlipidemia, and prior MI.  PAST SURGICAL HISTORY:  None.  CURRENT MEDICATIONS:  The patient is on IV Cipro.  HOME MEDICATIONS:  Aspirin, timolol, tamsulosin, Bactrim, ramipril, Lopressor, bimatoprost, isosorbide, Hydrocodone, and Plavix.  ALLERGIES:  No known drug allergies.  SOCIAL HISTORY:  Denies alcohol or drug use.  He lives at home with friends and family members.  He is disabled.  FAMILY HISTORY:  Hypertension and diabetes.  REVIEW OF SYSTEMS:  A 12-system review of systems obtained and negative except per the HPI.  The patient has no nausea now, but he did have some nausea and emesis in the emergency room.  PHYSICAL EXAMINATION:  VITAL SIGNS:  Temperature 99.5 which is the current temperature, his T-max was 100.4 pulse 75,  respirations 17, and blood pressure 110/54. GENERAL:  The patient is awake and alert.  He is in no acute distress. HEENT:  Normocephalic and atraumatic. NECK:  No JVD. RESPIRATION:  Rhythm and rate and effort is normal. EXTREMITIES:  No lower extremity edema. ABDOMEN:  Soft and nontender.  The bladder and suprapubic area soft and nondistended. NEUROLOGIC:  The patient has no focal deficits and again is alert and oriented x3. GU:  The patient's penis is not circumcised and no masses or lesions. Scrotum is normal without any erythema or edema.  Right testicle is palpably normal.  Left testicle is mildly enlarged as well as the left epididymis, but there is virtually no swelling, warmth, or erythema.  DIAGNOSTIC DATA:  White count 8, hemoglobin 8.9, hematocrit 25, BUN 44, creatinine 2.91.  PSA was 17.  Hemoglobin A1c was 7.3.  UA showed negative nitrite and positive leukocyte.  Microscopic showed 21-50 white cells with 3-6 red cells.  Urine culture is currently growing 60,000 colonies of E. Coli.  Blood cultures were negative x2 so far.  Scrotal ultrasound reviewed the images showed the right testicle was 4.1 cm which was homogenous.  No masses.  Left testicle is 3.9 cm.  It was homogenous, without masses.  Left epididymis was enlarged and hyperemic, but again there are no masses or focal fluid collections.  IMPRESSION: 1. Epididymo-orchitis. 2. Acute bacterial prostatitis. 3. Benign prostatic hypertrophy. 4. Elevated prostate-specific antigen.  PLAN:  Clinically, the patient is improving, and left testicle is minimally swollen today, nontender on exam.  Also the scrotum itself had no erythema or edema.  We will continue antibiotics based on the urine culture and transitioned to p.o. antibiotics for 3-4 weeks total. Elevated PSA would be expected in the face of infection.  This would need to be repeated after his infection is treated on an outpatient basis. Of concern, the  patient's BUN and creatinine continue to rise, therefore we will get a renal ultrasound and check a postvoid residual. The patient remains stable.  He can follow up with Dr. Vernie Ammons at [facility] specialist in 2-3 weeks for outpatient followup. Please call [phone] to confirm appointment.          ______________________________ Jerilee Field, MD     ME/MEDQ  D:  07/28/2010  T:  07/28/2010  Job:  [id number]  Electronically Signed by Jerilee Field MD on 08/10/2010 08:29:06 AM

## 2010-10-18 LAB — URINE CULTURE: Colony Count: 50000

## 2010-10-18 LAB — URINALYSIS, ROUTINE W REFLEX MICROSCOPIC
Ketones, ur: 15 — AB
Nitrite: NEGATIVE
Protein, ur: 100 — AB
Urobilinogen, UA: 1

## 2010-10-18 LAB — URINE MICROSCOPIC-ADD ON

## 2011-05-24 HISTORY — PX: NM MYOCAR PERF WALL MOTION: HXRAD629

## 2011-05-24 HISTORY — PX: DOPPLER ECHOCARDIOGRAPHY: SHX263

## 2011-09-17 ENCOUNTER — Encounter (HOSPITAL_COMMUNITY): Payer: Self-pay | Admitting: *Deleted

## 2011-09-17 ENCOUNTER — Emergency Department (HOSPITAL_COMMUNITY)
Admission: EM | Admit: 2011-09-17 | Discharge: 2011-09-17 | Disposition: A | Discharge status: Home / Self Care | Payer: Medicare Other | Attending: Emergency Medicine | Admitting: Emergency Medicine

## 2011-09-17 ENCOUNTER — Emergency Department (HOSPITAL_COMMUNITY): Payer: Medicare Other

## 2011-09-17 DIAGNOSIS — E119 Type 2 diabetes mellitus without complications: Secondary | ICD-10-CM | POA: Insufficient documentation

## 2011-09-17 DIAGNOSIS — N453 Epididymo-orchitis: Secondary | ICD-10-CM | POA: Insufficient documentation

## 2011-09-17 DIAGNOSIS — Z87891 Personal history of nicotine dependence: Secondary | ICD-10-CM | POA: Insufficient documentation

## 2011-09-17 DIAGNOSIS — I10 Essential (primary) hypertension: Secondary | ICD-10-CM | POA: Insufficient documentation

## 2011-09-17 DIAGNOSIS — N451 Epididymitis: Secondary | ICD-10-CM

## 2011-09-17 HISTORY — DX: Bradycardia, unspecified: R00.1

## 2011-09-17 HISTORY — DX: Unspecified glaucoma: H40.9

## 2011-09-17 HISTORY — DX: Essential (primary) hypertension: I10

## 2011-09-17 LAB — URINALYSIS, ROUTINE W REFLEX MICROSCOPIC
Glucose, UA: NEGATIVE mg/dL
Ketones, ur: NEGATIVE mg/dL
Leukocytes, UA: NEGATIVE
Protein, ur: NEGATIVE mg/dL
Urobilinogen, UA: 1 mg/dL (ref 0.0–1.0)

## 2011-09-17 LAB — POCT I-STAT, CHEM 8
BUN: 24 mg/dL — ABNORMAL HIGH (ref 6–23)
Calcium, Ion: 1.1 mmol/L — ABNORMAL LOW (ref 1.13–1.30)
Chloride: 108 mEq/L (ref 96–112)
Creatinine, Ser: 1.8 mg/dL — ABNORMAL HIGH (ref 0.50–1.35)
Sodium: 143 mEq/L (ref 135–145)

## 2011-09-17 LAB — BASIC METABOLIC PANEL
BUN: 24 mg/dL — ABNORMAL HIGH (ref 6–23)
CO2: 24 mEq/L (ref 19–32)
Chloride: 103 mEq/L (ref 96–112)
GFR calc Af Amer: 40 mL/min — ABNORMAL LOW (ref >90)
Potassium: 3.4 mEq/L — ABNORMAL LOW (ref 3.5–5.1)

## 2011-09-17 LAB — GLUCOSE, CAPILLARY: Glucose-Capillary: 90 mg/dL (ref 70–99)

## 2011-09-17 MED ORDER — SULFAMETHOXAZOLE-TRIMETHOPRIM 800-160 MG PO TABS: 1.0000 | ORAL_TABLET | Freq: Two times a day (BID) | ORAL | Status: AC

## 2011-09-17 MED ORDER — SULFAMETHOXAZOLE-TMP DS 800-160 MG PO TABS
1.0000 | ORAL_TABLET | Freq: Once | ORAL | Status: AC
  Administered 2011-09-17: 1 via ORAL
  Filled 2011-09-17: qty 1

## 2011-09-17 NOTE — ED Notes (Signed)
Pt s glucose has been checked

## 2011-09-17 NOTE — ED Notes (Signed)
Pt to US.

## 2011-09-17 NOTE — ED Notes (Signed)
Painful urination; sometimes the urine just drips, followed by a lot of urine.

## 2011-09-17 NOTE — ED Notes (Signed)
Remains in US.

## 2011-09-18 ENCOUNTER — Encounter (HOSPITAL_COMMUNITY): Payer: Self-pay | Admitting: *Deleted

## 2011-09-18 ENCOUNTER — Emergency Department (HOSPITAL_COMMUNITY)
Admission: EM | Admit: 2011-09-18 | Discharge: 2011-09-18 | Disposition: A | Discharge status: Home / Self Care | Payer: Medicare Other | Attending: Emergency Medicine | Admitting: Emergency Medicine

## 2011-09-18 DIAGNOSIS — R339 Retention of urine, unspecified: Secondary | ICD-10-CM | POA: Insufficient documentation

## 2011-09-18 DIAGNOSIS — E119 Type 2 diabetes mellitus without complications: Secondary | ICD-10-CM | POA: Insufficient documentation

## 2011-09-18 DIAGNOSIS — Z7982 Long term (current) use of aspirin: Secondary | ICD-10-CM | POA: Insufficient documentation

## 2011-09-18 DIAGNOSIS — I10 Essential (primary) hypertension: Secondary | ICD-10-CM | POA: Insufficient documentation

## 2011-09-18 DIAGNOSIS — Z87891 Personal history of nicotine dependence: Secondary | ICD-10-CM | POA: Insufficient documentation

## 2011-09-18 LAB — URINALYSIS, MICROSCOPIC ONLY
Nitrite: NEGATIVE
Protein, ur: NEGATIVE mg/dL
Urobilinogen, UA: 1 mg/dL (ref 0.0–1.0)

## 2011-09-18 MED ORDER — ONDANSETRON 8 MG PO TBDP
8.0000 mg | ORAL_TABLET | Freq: Once | ORAL | Status: AC
  Administered 2011-09-18: 8 mg via ORAL
  Filled 2011-09-18: qty 1

## 2011-09-18 NOTE — ED Provider Notes (Signed)
History     CSN: [id number]  Arrival date & time 09/18/11  6578   First MD Initiated Contact with Patient 09/18/11 [phone]      Chief Complaint  Patient presents with  . Urinary Retention   HPI  History provided by the patient. Patient is 78 year old male with history of hypertension, diabetes, benign prostatic hypertrophy who presents with complaints of urinary retention. Patient states that he was seen at [facility] emergency room around 3 PM yesterday afternoon for similar complaints of dribbling with urination and urinary retention. Patient states he had lab testing, urine tests and ultrasound study performed and was given prescriptions at home but complains he had no improvement of retention symptoms. Patient states she has not been able to full evacuation of his bladder. Patient denies any fever, chills or sweats. Denies any nausea vomiting.    Past Medical History  Diagnosis Date  . Glaucoma   . Bradycardia   . Hypertension   . Diabetes mellitus     History reviewed. No pertinent past surgical history.  History reviewed. No pertinent family history.  History  Substance Use Topics  . Smoking status: Former Games developer  . Smokeless tobacco: Not on file  . Alcohol Use: No      Review of Systems  Constitutional: Negative for fever and chills.  Gastrointestinal: Positive for abdominal pain. Negative for nausea, vomiting, diarrhea and constipation.  Genitourinary: Positive for decreased urine volume. Negative for frequency, hematuria, flank pain, scrotal swelling, penile pain and testicular pain.    Allergies  Review of patient's allergies indicates no known allergies.  Home Medications   Current Outpatient Rx  Name Route Sig Dispense Refill  . ASPIRIN EC 81 MG PO TBEC Oral Take 81 mg by mouth daily.    Marland Kitchen DABIGATRAN ETEXILATE MESYLATE 75 MG PO CAPS Oral Take 75 mg by mouth every 12 (twelve) hours.    . FUROSEMIDE 20 MG PO TABS Oral Take 20 mg by mouth daily.    Marland Kitchen  LINAGLIPTIN 5 MG PO TABS Oral Take 5 mg by mouth daily.    Marland Kitchen LOSARTAN POTASSIUM 25 MG PO TABS Oral Take 25 mg by mouth daily.    Marland Kitchen PRAVASTATIN SODIUM 20 MG PO TABS Oral Take 40 mg by mouth daily.    . SULFAMETHOXAZOLE-TRIMETHOPRIM 800-160 MG PO TABS Oral Take 1 tablet by mouth every 12 (twelve) hours. 10 tablet 0  . TAMSULOSIN HCL 0.4 MG PO CAPS Oral Take 0.4 mg by mouth daily after supper.      BP 138/87  Pulse 86  Temp 97.8 F (36.6 C) (Oral)  Resp 18  SpO2 100%  Physical Exam  Nursing note and vitals reviewed. Constitutional: He appears well-developed and well-nourished. No distress.  HENT:  Head: Normocephalic.  Cardiovascular: Normal rate.  An irregular rhythm present.  Pulmonary/Chest: Effort normal and breath sounds normal. No respiratory distress. He has no wheezes. He has no rales.  Abdominal: Soft. There is tenderness. There is no rebound and no guarding.  Genitourinary: Rectum normal. Prostate is enlarged. Prostate is not tender. Right testis shows no mass, no swelling and no tenderness. Left testis shows no mass, no swelling and no tenderness. Uncircumcised. No penile erythema.  Neurological: He is alert.  Skin: Skin is warm.  Psychiatric: He has a normal mood and affect. His behavior is normal.    ED Course  Procedures (including critical care time)  Results for orders placed during the hospital encounter of 09/17/11  URINALYSIS, ROUTINE W REFLEX  MICROSCOPIC      Component Value Range   Color, Urine YELLOW  YELLOW   APPearance CLEAR  CLEAR   Specific Gravity, Urine 1.014  1.005 - 1.030   pH 5.5  5.0 - 8.0   Glucose, UA NEGATIVE  NEGATIVE mg/dL   Hgb urine dipstick TRACE (*) NEGATIVE   Bilirubin Urine NEGATIVE  NEGATIVE   Ketones, ur NEGATIVE  NEGATIVE mg/dL   Protein, ur NEGATIVE  NEGATIVE mg/dL   Urobilinogen, UA 1.0  0.0 - 1.0 mg/dL   Nitrite NEGATIVE  NEGATIVE   Leukocytes, UA NEGATIVE  NEGATIVE  URINE MICROSCOPIC-ADD ON      Component Value Range    Squamous Epithelial / LPF RARE  RARE   WBC, UA 0-2  <3 WBC/hpf   RBC / HPF 0-2  <3 RBC/hpf  GLUCOSE, CAPILLARY      Component Value Range   Glucose-Capillary 90  70 - 99 mg/dL   Comment 1 Notify RN    BASIC METABOLIC PANEL      Component Value Range   Sodium 139  135 - 145 mEq/L   Potassium 3.4 (*) 3.5 - 5.1 mEq/L   Chloride 103  96 - 112 mEq/L   CO2 24  19 - 32 mEq/L   Glucose, Bld 186 (*) 70 - 99 mg/dL   BUN 24 (*) 6 - 23 mg/dL   Creatinine, Ser 4.09 (*) 0.50 - 1.35 mg/dL   Calcium 9.0  8.4 - 81.1 mg/dL   GFR calc non Af Amer 35 (*) >90 mL/min   GFR calc Af Amer 40 (*) >90 mL/min  POCT I-STAT, CHEM 8      Component Value Range   Sodium 143  135 - 145 mEq/L   Potassium 3.6  3.5 - 5.1 mEq/L   Chloride 108  96 - 112 mEq/L   BUN 24 (*) 6 - 23 mg/dL   Creatinine, Ser 9.14 (*) 0.50 - 1.35 mg/dL   Glucose, Bld 782 (*) 70 - 99 mg/dL   Calcium, Ion 9.56 (*) 1.13 - 1.30 mmol/L   TCO2 22  0 - 100 mmol/L   Hemoglobin 11.6 (*) 13.0 - 17.0 g/dL   HCT 21.3 (*) 08.6 - 57.8 %    Results for orders placed during the hospital encounter of 09/18/11  URINALYSIS, WITH MICROSCOPIC      Component Value Range   Color, Urine YELLOW  YELLOW   APPearance CLOUDY (*) CLEAR   Specific Gravity, Urine 1.013  1.005 - 1.030   pH 5.5  5.0 - 8.0   Glucose, UA 100 (*) NEGATIVE mg/dL   Hgb urine dipstick MODERATE (*) NEGATIVE   Bilirubin Urine NEGATIVE  NEGATIVE   Ketones, ur NEGATIVE  NEGATIVE mg/dL   Protein, ur NEGATIVE  NEGATIVE mg/dL   Urobilinogen, UA 1.0  0.0 - 1.0 mg/dL   Nitrite NEGATIVE  NEGATIVE   Leukocytes, UA TRACE (*) NEGATIVE   WBC, UA 0-2  <3 WBC/hpf   RBC / HPF 21-50  <3 RBC/hpf   Squamous Epithelial / LPF RARE  RARE     US Scrotum  09/17/2011  *RADIOLOGY REPORT*  Clinical Data:  Right scrotal pain.  SCROTAL ULTRASOUND DOPPLER ULTRASOUND OF THE TESTICLES  Technique: Complete ultrasound examination of the testicles, epididymis, and other scrotal structures was performed.  Color and  spectral Doppler ultrasound were also utilized to evaluate blood flow to the testicles.  Comparison:  Scrotal ultrasound 07/27/2010.  Findings:  Right testis:  Measures 4.7 x 2.7  x 2.8 cm demonstrates normal, homogeneous echotexture.  Left testis:  Measures 4.2 x 1.9 x 2.4 cm.  There is decreased echogenicity in the superior 3/4ths of the left testicle without discrete mass.  Right epididymis:  Normal in size and appearance.  Left epididymis:  Normal in size and appearance.  Hydrocele:  present/absent.  Varicocele:  present/absent.  Pulsed Doppler interrogation of both testes demonstrates normal wave forms are identified for the right testicle. There is systolic flow for the left testicle but no diastolic flow is identified.  IMPRESSION:  1.  Abnormal echotexture and absence of diastolic wave form for the left testicle is worrisome for early or incomplete torsion.  This finding may be related to epididymo-orchitis seen on the comparison study. 2.  Normal appearing right testicle. 3. Critical Value/emergent results were called by telephone at the time of interpretation on 09/17/2011 at 10:44 p.m. to Remi Haggard, who verbally acknowledged these results.   Original Report Authenticated By: Bernadene Bell. Maricela Curet, M.D.    [place] Art/ven Flow Abd Pelv Doppler  09/17/2011  *RADIOLOGY REPORT*  Clinical Data:  Right scrotal pain.  SCROTAL ULTRASOUND DOPPLER ULTRASOUND OF THE TESTICLES  Technique: Complete ultrasound examination of the testicles, epididymis, and other scrotal structures was performed.  Color and spectral Doppler ultrasound were also utilized to evaluate blood flow to the testicles.  Comparison:  Scrotal ultrasound 07/27/2010.  Findings:  Right testis:  Measures 4.7 x 2.7 x 2.8 cm demonstrates normal, homogeneous echotexture.  Left testis:  Measures 4.2 x 1.9 x 2.4 cm.  There is decreased echogenicity in the superior 3/4ths of the left testicle without discrete mass.  Right epididymis:  Normal in size and  appearance.  Left epididymis:  Normal in size and appearance.  Hydrocele:  present/absent.  Varicocele:  present/absent.  Pulsed Doppler interrogation of both testes demonstrates normal wave forms are identified for the right testicle. There is systolic flow for the left testicle but no diastolic flow is identified.  IMPRESSION:  1.  Abnormal echotexture and absence of diastolic wave form for the left testicle is worrisome for early or incomplete torsion.  This finding may be related to epididymo-orchitis seen on the comparison study. 2.  Normal appearing right testicle. 3. Critical Value/emergent results were called by telephone at the time of interpretation on 09/17/2011 at 10:44 p.m. to Remi Haggard, who verbally acknowledged these results.   Original Report Authenticated By: Bernadene Bell. D'ALESSIO, M.D.      1. Urinary retention       MDM  3:55 AM patient seen and evaluated. Patient no acute distress.  4:15 AM Patient reports feeling significantly better after Foley catheter placed. Patient has over 600 cc out and Foley.  Review of lab testing and ultrasound test performed earlier yesterday showed patient had slight renal insufficiency unchanged from baseline. Ultrasounds questioned possible torsion of left testicle however patient has no testicular pain. There was also some signs for possible epididymitis. He began patient has no tenderness in scrotum. The appearance is normal.  I spoke with Dr. Maricela Curet with radiology. We discussed the ultrasound from earlier. Appearance only slightly suggests possibility of early torsion there is no other evidence of torsion. Patient is asymptomatic without tenderness or pain to the scrotum or testicles. There is no clinical swelling or abnormality on exam. Patient also denies reporting previous left testicle or scrotal pains. At this time there is low clinical concern for torsion. Patient does have clinical symptoms of urinary retention most likely cause from  enlarged  prostate. He has significant relief after Foley catheter placed. Patient will require urology followup for urinary retention.   Angus Seller, [place] 09/18/11 [phone]

## 2011-09-18 NOTE — ED Provider Notes (Signed)
Medical screening examination/treatment/procedure(s) were performed by non-physician practitioner and as supervising physician I was immediately available for consultation/collaboration.  Eulogio Requena M Ellese Julius, MD 09/18/11 2138 

## 2011-09-18 NOTE — ED Notes (Signed)
Pt seen at [facility] ED this pm for groin pain; discharged home around midnight; c/o unable to void since discharge despite the urge to go; c/o pain and pressure to bladder area.

## 2011-09-18 NOTE — ED Provider Notes (Signed)
History     CSN: [id number]  Arrival date & time 09/17/11  1551   First MD Initiated Contact with Patient 09/17/11 2015      Chief Complaint  Patient presents with  . Urinary Retention    (Consider location/radiation/quality/duration/timing/severity/associated sxs/prior treatment) Patient is a 78 y.o. male presenting with testicular pain. The history is provided by the patient. No language interpreter was used.  Testicle Pain This is a new problem. The current episode started today. The problem occurs intermittently. The problem has been unchanged. Pertinent negatives include no fever, nausea or vomiting. Exacerbated by: voiding.  78 yo male with c/o R testicle pain with uriniation.  No fever n/v or retension.  Prior hx of orchitis/epiditamitis one year ago.    Past Medical History  Diagnosis Date  . Glaucoma   . Bradycardia   . Hypertension   . Diabetes mellitus     History reviewed. No pertinent past surgical history.  No family history on file.  History  Substance Use Topics  . Smoking status: Former Games developer  . Smokeless tobacco: Not on file  . Alcohol Use: No      Review of Systems  Constitutional: Negative.  Negative for fever.  HENT: Negative.   Eyes: Negative.   Respiratory: Negative.   Cardiovascular: Negative.   Gastrointestinal: Negative.  Negative for nausea and vomiting.  Genitourinary: Positive for testicular pain. Negative for dysuria, urgency, frequency, hematuria, flank pain, decreased urine volume, penile swelling, scrotal swelling and enuresis.  Musculoskeletal: Negative for back pain.  Neurological: Negative.   Psychiatric/Behavioral: Negative.   All other systems reviewed and are negative.    Allergies  Review of patient's allergies indicates no known allergies.  Home Medications   Current Outpatient Rx  Name Route Sig Dispense Refill  . ASPIRIN EC 81 MG PO TBEC Oral Take 81 mg by mouth daily.    Marland Kitchen DABIGATRAN ETEXILATE MESYLATE 75 MG  PO CAPS Oral Take 75 mg by mouth every 12 (twelve) hours.    . FUROSEMIDE 20 MG PO TABS Oral Take 20 mg by mouth daily.    Marland Kitchen LINAGLIPTIN 5 MG PO TABS Oral Take 5 mg by mouth daily.    Marland Kitchen LOSARTAN POTASSIUM 25 MG PO TABS Oral Take 25 mg by mouth daily.    Marland Kitchen PRAVASTATIN SODIUM 20 MG PO TABS Oral Take 40 mg by mouth daily.    Marland Kitchen TAMSULOSIN HCL 0.4 MG PO CAPS Oral Take 0.4 mg by mouth daily after supper.    . SULFAMETHOXAZOLE-TRIMETHOPRIM 800-160 MG PO TABS Oral Take 1 tablet by mouth every 12 (twelve) hours. 10 tablet 0    BP 175/88  Pulse 75  Temp 98.2 F (36.8 C) (Oral)  Resp 18  SpO2 100%  Physical Exam  Nursing note and vitals reviewed. Constitutional: He is oriented to person, place, and time. He appears well-developed and well-nourished.  HENT:  Head: Normocephalic.  Eyes: Conjunctivae normal and EOM are normal. Pupils are equal, round, and reactive to light.  Neck: Normal range of motion. Neck supple.  Cardiovascular: Normal rate.   Pulmonary/Chest: Effort normal.  Abdominal: Soft.  Genitourinary: Right testis shows no swelling and no tenderness. Left testis shows no swelling and no tenderness. Circumcised. No penile tenderness. No discharge found.       No tenderness with palpatation to scrotum/testcles/ no inflamation/no mass  Musculoskeletal: Normal range of motion.  Neurological: He is alert and oriented to person, place, and time.  Skin: Skin is warm and dry.  Psychiatric: He has a normal mood and affect.    ED Course  Procedures (including critical care time) U/s shows L testicle possible early torsion.  No coorisponding pain on exam.  Patient c/o R testicle pain with voiding.    Dr. Annabell Howells will see in am.  Aware of u/s finding.  Will start antibiotic for possible early epiditimytis.     Labs Reviewed  URINALYSIS, ROUTINE W REFLEX MICROSCOPIC - Abnormal; Notable for the following:    Hgb urine dipstick TRACE (*)     All other components within normal limits  BASIC  METABOLIC PANEL - Abnormal; Notable for the following:    Potassium 3.4 (*)     Glucose, Bld 186 (*)     BUN 24 (*)     Creatinine, Ser 1.74 (*)     GFR calc non Af Amer 35 (*)     GFR calc Af Amer 40 (*)     All other components within normal limits  POCT I-STAT, CHEM 8 - Abnormal; Notable for the following:    BUN 24 (*)     Creatinine, Ser 1.80 (*)     Glucose, Bld 185 (*)     Calcium, Ion 1.10 (*)     Hemoglobin 11.6 (*)     HCT 34.0 (*)     All other components within normal limits  URINE MICROSCOPIC-ADD ON  GLUCOSE, CAPILLARY  LAB REPORT - SCANNED   US Scrotum  09/17/2011  *RADIOLOGY REPORT*  Clinical Data:  Right scrotal pain.  SCROTAL ULTRASOUND DOPPLER ULTRASOUND OF THE TESTICLES  Technique: Complete ultrasound examination of the testicles, epididymis, and other scrotal structures was performed.  Color and spectral Doppler ultrasound were also utilized to evaluate blood flow to the testicles.  Comparison:  Scrotal ultrasound 07/27/2010.  Findings:  Right testis:  Measures 4.7 x 2.7 x 2.8 cm demonstrates normal, homogeneous echotexture.  Left testis:  Measures 4.2 x 1.9 x 2.4 cm.  There is decreased echogenicity in the superior 3/4ths of the left testicle without discrete mass.  Right epididymis:  Normal in size and appearance.  Left epididymis:  Normal in size and appearance.  Hydrocele:  present/absent.  Varicocele:  present/absent.  Pulsed Doppler interrogation of both testes demonstrates normal wave forms are identified for the right testicle. There is systolic flow for the left testicle but no diastolic flow is identified.  IMPRESSION:  1.  Abnormal echotexture and absence of diastolic wave form for the left testicle is worrisome for early or incomplete torsion.  This finding may be related to epididymo-orchitis seen on the comparison study. 2.  Normal appearing right testicle. 3. Critical Value/emergent results were called by telephone at the time of interpretation on 09/17/2011 at  10:44 p.m. to Remi Haggard, who verbally acknowledged these results.   Original Report Authenticated By: Bernadene Bell. Maricela Curet, M.D.    [place] Art/ven Flow Abd Pelv Doppler  09/17/2011  *RADIOLOGY REPORT*  Clinical Data:  Right scrotal pain.  SCROTAL ULTRASOUND DOPPLER ULTRASOUND OF THE TESTICLES  Technique: Complete ultrasound examination of the testicles, epididymis, and other scrotal structures was performed.  Color and spectral Doppler ultrasound were also utilized to evaluate blood flow to the testicles.  Comparison:  Scrotal ultrasound 07/27/2010.  Findings:  Right testis:  Measures 4.7 x 2.7 x 2.8 cm demonstrates normal, homogeneous echotexture.  Left testis:  Measures 4.2 x 1.9 x 2.4 cm.  There is decreased echogenicity in the superior 3/4ths of the left testicle without discrete mass.  Right  epididymis:  Normal in size and appearance.  Left epididymis:  Normal in size and appearance.  Hydrocele:  present/absent.  Varicocele:  present/absent.  Pulsed Doppler interrogation of both testes demonstrates normal wave forms are identified for the right testicle. There is systolic flow for the left testicle but no diastolic flow is identified.  IMPRESSION:  1.  Abnormal echotexture and absence of diastolic wave form for the left testicle is worrisome for early or incomplete torsion.  This finding may be related to epididymo-orchitis seen on the comparison study. 2.  Normal appearing right testicle. 3. Critical Value/emergent results were called by telephone at the time of interpretation on 09/17/2011 at 10:44 p.m. to Remi Haggard, who verbally acknowledged these results.   Original Report Authenticated By: Bernadene Bell. D'ALESSIO, M.D.      1. Epididymitis       MDM  78yo with R testiticle pain with urination.  Will treat for early epididymtis.  Bactrim ds in the ER.  Follow up in am with Dr. Annabell Howells.  Return for severe pain or any concerns.     Labs Reviewed  URINALYSIS, ROUTINE W REFLEX MICROSCOPIC -  Abnormal; Notable for the following:    Hgb urine dipstick TRACE (*)     All other components within normal limits  BASIC METABOLIC PANEL - Abnormal; Notable for the following:    Potassium 3.4 (*)     Glucose, Bld 186 (*)     BUN 24 (*)     Creatinine, Ser 1.74 (*)     GFR calc non Af Amer 35 (*)     GFR calc Af Amer 40 (*)     All other components within normal limits  POCT I-STAT, CHEM 8 - Abnormal; Notable for the following:    BUN 24 (*)     Creatinine, Ser 1.80 (*)     Glucose, Bld 185 (*)     Calcium, Ion 1.10 (*)     Hemoglobin 11.6 (*)     HCT 34.0 (*)     All other components within normal limits  URINE MICROSCOPIC-ADD ON  GLUCOSE, CAPILLARY  LAB REPORT - SCANNED           Remi Haggard, NP 09/18/11 1729

## 2011-09-20 LAB — URINE CULTURE

## 2011-09-20 NOTE — ED Provider Notes (Signed)
Medical screening examination/treatment/procedure(s) were conducted as a shared visit with non-physician practitioner(s) and myself.  I personally evaluated the patient during the encounter   Loren Racer, MD 09/20/11 [id number]

## 2011-09-21 NOTE — ED Notes (Addendum)
+   Urine Patient treated with Cipro-sensitive to same-chart appended per protocol MD. 

## 2011-09-24 ENCOUNTER — Telehealth (HOSPITAL_COMMUNITY): Payer: Self-pay | Admitting: Emergency Medicine

## 2011-09-27 ENCOUNTER — Telehealth (HOSPITAL_COMMUNITY): Payer: Self-pay | Admitting: *Deleted

## 2011-12-19 ENCOUNTER — Encounter (HOSPITAL_BASED_OUTPATIENT_CLINIC_OR_DEPARTMENT_OTHER): Payer: Medicare Other

## 2012-01-01 ENCOUNTER — Other Ambulatory Visit: Payer: Self-pay | Admitting: Urology

## 2012-01-11 ENCOUNTER — Encounter (HOSPITAL_COMMUNITY): Payer: Self-pay | Admitting: Pharmacy Technician

## 2012-01-17 ENCOUNTER — Encounter (HOSPITAL_COMMUNITY): Payer: Self-pay

## 2012-01-17 ENCOUNTER — Encounter (HOSPITAL_COMMUNITY)
Admission: RE | Admit: 2012-01-17 | Discharge: 2012-01-17 | Disposition: A | Discharge status: Home / Self Care | Payer: Medicare Other | Source: Ambulatory Visit | Attending: Urology | Admitting: Urology

## 2012-01-17 ENCOUNTER — Ambulatory Visit (HOSPITAL_COMMUNITY)
Admission: RE | Admit: 2012-01-17 | Discharge: 2012-01-17 | Disposition: A | Discharge status: Home / Self Care | Payer: Medicare Other | Source: Ambulatory Visit | Attending: Urology | Admitting: Urology

## 2012-01-17 DIAGNOSIS — Z01818 Encounter for other preprocedural examination: Secondary | ICD-10-CM | POA: Insufficient documentation

## 2012-01-17 DIAGNOSIS — Z01812 Encounter for preprocedural laboratory examination: Secondary | ICD-10-CM | POA: Insufficient documentation

## 2012-01-17 DIAGNOSIS — I517 Cardiomegaly: Secondary | ICD-10-CM | POA: Insufficient documentation

## 2012-01-17 HISTORY — DX: Cardiac arrhythmia, unspecified: I49.9

## 2012-01-17 HISTORY — DX: Shortness of breath: R06.02

## 2012-01-17 HISTORY — DX: Benign prostatic hyperplasia without lower urinary tract symptoms: N40.0

## 2012-01-17 HISTORY — DX: Atherosclerotic heart disease of native coronary artery without angina pectoris: I25.10

## 2012-01-17 HISTORY — DX: Other difficulties with micturition: R39.198

## 2012-01-17 HISTORY — DX: Hyperlipidemia, unspecified: E78.5

## 2012-01-17 HISTORY — DX: Cerebral infarction, unspecified: I63.9

## 2012-01-17 LAB — CBC
HCT: 34 % — ABNORMAL LOW (ref 39.0–52.0)
MCHC: 33.8 g/dL (ref 30.0–36.0)
MCV: 91.6 fL (ref 78.0–100.0)
RDW: 14.1 % (ref 11.5–15.5)

## 2012-01-17 LAB — BASIC METABOLIC PANEL
BUN: 23 mg/dL (ref 6–23)
Creatinine, Ser: 1.72 mg/dL — ABNORMAL HIGH (ref 0.50–1.35)
GFR calc Af Amer: 41 mL/min — ABNORMAL LOW (ref >90)
GFR calc non Af Amer: 35 mL/min — ABNORMAL LOW (ref >90)
Potassium: 3.6 mEq/L (ref 3.5–5.1)

## 2012-01-17 LAB — SURGICAL PCR SCREEN: Staphylococcus aureus: POSITIVE — AB

## 2012-01-17 NOTE — Patient Instructions (Addendum)
Osric Klopf  01/17/2012                           YOUR PROCEDURE IS SCHEDULED ON:  01/24/12               PLEASE REPORT TO SHORT STAY CENTER AT :  8:00 AM               CALL THIS NUMBER IF ANY PROBLEMS THE DAY OF SURGERY :               [phone]                      REMEMBER:   Do not eat food or drink liquids AFTER MIDNIGHT   Take these medicines the morning of surgery with A SIP OF WATER:  NONE   Do not wear jewelry, make-up   Do not wear lotions, powders, or perfumes.   Do not shave legs or underarms 12 hrs. before surgery (men may shave face)  Do not bring valuables to the hospital.  Contacts, dentures or bridgework may not be worn into surgery.  Leave suitcase in the car. After surgery it may be brought to your room.  For patients admitted to the hospital more than one night, checkout time is 11:00                          The day of discharge.   Patients discharged the day of surgery will not be allowed to drive home                             If going home same day of surgery, must have someone stay with you first                           24 hrs at home and arrange for some one to drive you home from hospital.    Special Instructions:   Please read over the following fact sheets that you were given:               1. MRSA  INFORMATION                      2. [place] PREPARING FOR SURGERY SHEET                                                X_____________________________________________________________________        Failure to follow these instructions may result in cancellation of your surgery

## 2012-01-17 NOTE — Progress Notes (Signed)
01/17/12 1332  OBSTRUCTIVE SLEEP APNEA  Do you snore loudly (loud enough to be heard through closed doors)?  1  Do you often feel tired, fatigued, or sleepy during the daytime? 1  Has anyone observed you stop breathing during your sleep? 0  Do you have, or are you being treated for high blood pressure? 1  BMI more than 35 kg/m2? 0  Age over 77 years old? 1  Neck circumference greater than 40 cm/18 inches? 0  Gender: 1  Obstructive Sleep Apnea Score 5   Score 4 or greater  Results sent to PCP

## 2012-01-19 NOTE — Progress Notes (Signed)
UA faaxed to Dr. Berneice Heinrich - confirmation recieved

## 2012-01-23 ENCOUNTER — Encounter (HOSPITAL_BASED_OUTPATIENT_CLINIC_OR_DEPARTMENT_OTHER): Payer: Medicare Other

## 2012-01-24 ENCOUNTER — Encounter (HOSPITAL_COMMUNITY)
Admission: RE | Disposition: A | Discharge status: Home / Self Care | Payer: Self-pay | Source: Ambulatory Visit | Attending: Urology

## 2012-01-24 ENCOUNTER — Ambulatory Visit (HOSPITAL_COMMUNITY): Payer: Medicare Other | Admitting: Anesthesiology

## 2012-01-24 ENCOUNTER — Encounter (HOSPITAL_COMMUNITY): Payer: Self-pay | Admitting: Anesthesiology

## 2012-01-24 ENCOUNTER — Inpatient Hospital Stay (HOSPITAL_COMMUNITY)
Admission: RE | Admit: 2012-01-24 | Discharge: 2012-01-26 | DRG: 713 | Disposition: A | Discharge status: Home / Self Care | Payer: Medicare Other | Source: Ambulatory Visit | Attending: Urology | Admitting: Urology

## 2012-01-24 ENCOUNTER — Encounter (HOSPITAL_COMMUNITY): Payer: Self-pay | Admitting: *Deleted

## 2012-01-24 DIAGNOSIS — I10 Essential (primary) hypertension: Secondary | ICD-10-CM | POA: Diagnosis present

## 2012-01-24 DIAGNOSIS — Y838 Other surgical procedures as the cause of abnormal reaction of the patient, or of later complication, without mention of misadventure at the time of the procedure: Secondary | ICD-10-CM | POA: Diagnosis not present

## 2012-01-24 DIAGNOSIS — I4891 Unspecified atrial fibrillation: Secondary | ICD-10-CM | POA: Diagnosis present

## 2012-01-24 DIAGNOSIS — N401 Enlarged prostate with lower urinary tract symptoms: Principal | ICD-10-CM | POA: Diagnosis present

## 2012-01-24 DIAGNOSIS — E119 Type 2 diabetes mellitus without complications: Secondary | ICD-10-CM | POA: Diagnosis present

## 2012-01-24 DIAGNOSIS — I2510 Atherosclerotic heart disease of native coronary artery without angina pectoris: Secondary | ICD-10-CM | POA: Diagnosis present

## 2012-01-24 DIAGNOSIS — Z8673 Personal history of transient ischemic attack (TIA), and cerebral infarction without residual deficits: Secondary | ICD-10-CM

## 2012-01-24 DIAGNOSIS — R339 Retention of urine, unspecified: Secondary | ICD-10-CM | POA: Diagnosis present

## 2012-01-24 DIAGNOSIS — N138 Other obstructive and reflux uropathy: Principal | ICD-10-CM | POA: Diagnosis present

## 2012-01-24 DIAGNOSIS — IMO0002 Reserved for concepts with insufficient information to code with codable children: Secondary | ICD-10-CM | POA: Diagnosis not present

## 2012-01-24 HISTORY — PX: TRANSURETHRAL RESECTION OF PROSTATE: SHX73

## 2012-01-24 LAB — GLUCOSE, CAPILLARY

## 2012-01-24 LAB — HEMOGLOBIN AND HEMATOCRIT, BLOOD
HCT: 28.5 % — ABNORMAL LOW (ref 39.0–52.0)
Hemoglobin: 9.4 g/dL — ABNORMAL LOW (ref 13.0–17.0)

## 2012-01-24 SURGERY — TRANSURETHRAL RESECTION OF THE PROSTATE WITH GYRUS INSTRUMENTS
Anesthesia: General | Site: Prostate | Wound class: Clean Contaminated

## 2012-01-24 MED ORDER — MUPIROCIN 2 % EX OINT
TOPICAL_OINTMENT | CUTANEOUS | Status: AC
  Administered 2012-01-24: 1
  Filled 2012-01-24: qty 22

## 2012-01-24 MED ORDER — BIMATOPROST 0.01 % OP SOLN
1.0000 [drp] | Freq: Every day | OPHTHALMIC | Status: DC
  Administered 2012-01-24 – 2012-01-25 (×2): 1 [drp] via OPHTHALMIC
  Filled 2012-01-24: qty 2.5

## 2012-01-24 MED ORDER — SODIUM CHLORIDE 0.9 % IR SOLN
Status: DC | PRN
  Administered 2012-01-24: 15000 mL

## 2012-01-24 MED ORDER — GENTAMICIN IN SALINE 1.6-0.9 MG/ML-% IV SOLN
80.0000 mg | INTRAVENOUS | Status: AC
  Administered 2012-01-24: 80 mg via INTRAVENOUS
  Filled 2012-01-24: qty 50

## 2012-01-24 MED ORDER — PROMETHAZINE HCL 25 MG/ML IJ SOLN: 6.2500 mg | INTRAMUSCULAR | Status: DC | PRN

## 2012-01-24 MED ORDER — LACTATED RINGERS IV SOLN
INTRAVENOUS | Status: DC
  Administered 2012-01-24: 1000 mL via INTRAVENOUS

## 2012-01-24 MED ORDER — LOSARTAN POTASSIUM 25 MG PO TABS
25.0000 mg | ORAL_TABLET | Freq: Every day | ORAL | Status: DC
  Administered 2012-01-24 – 2012-01-25 (×2): 25 mg via ORAL
  Filled 2012-01-24 (×3): qty 1

## 2012-01-24 MED ORDER — FUROSEMIDE 20 MG PO TABS
20.0000 mg | ORAL_TABLET | Freq: Every day | ORAL | Status: DC
  Administered 2012-01-24 – 2012-01-25 (×2): 20 mg via ORAL
  Filled 2012-01-24 (×3): qty 1

## 2012-01-24 MED ORDER — FENTANYL CITRATE 0.05 MG/ML IJ SOLN
INTRAMUSCULAR | Status: AC
  Filled 2012-01-24: qty 2

## 2012-01-24 MED ORDER — BIMATOPROST 0.03 % OP SOLN
1.0000 [drp] | Freq: Every day | OPHTHALMIC | Status: DC
  Filled 2012-01-24: qty 2.5

## 2012-01-24 MED ORDER — DOCUSATE SODIUM 100 MG PO CAPS
100.0000 mg | ORAL_CAPSULE | Freq: Two times a day (BID) | ORAL | Status: DC
  Administered 2012-01-24 – 2012-01-25 (×3): 100 mg via ORAL
  Filled 2012-01-24 (×5): qty 1

## 2012-01-24 MED ORDER — SODIUM CHLORIDE 0.9 % IV SOLN
1.0000 g | Freq: Four times a day (QID) | INTRAVENOUS | Status: AC
  Administered 2012-01-24 – 2012-01-25 (×3): 1 g via INTRAVENOUS
  Filled 2012-01-24 (×3): qty 1000

## 2012-01-24 MED ORDER — OXYCODONE HCL 5 MG PO TABS: 5.0000 mg | ORAL_TABLET | ORAL | Status: DC | PRN

## 2012-01-24 MED ORDER — HYDROMORPHONE HCL PF 1 MG/ML IJ SOLN
0.5000 mg | INTRAMUSCULAR | Status: DC | PRN
  Administered 2012-01-24: 0.5 mg via INTRAVENOUS
  Filled 2012-01-24: qty 1

## 2012-01-24 MED ORDER — INDIGOTINDISULFONATE SODIUM 8 MG/ML IJ SOLN
INTRAMUSCULAR | Status: DC | PRN
  Administered 2012-01-24: 5 mL via INTRAVENOUS

## 2012-01-24 MED ORDER — HYDROMORPHONE HCL PF 1 MG/ML IJ SOLN
INTRAMUSCULAR | Status: DC
Start: 2012-01-24 — End: 2012-01-24
  Filled 2012-01-24: qty 1

## 2012-01-24 MED ORDER — LINAGLIPTIN 5 MG PO TABS
5.0000 mg | ORAL_TABLET | Freq: Every day | ORAL | Status: DC
  Administered 2012-01-24 – 2012-01-25 (×2): 5 mg via ORAL
  Filled 2012-01-24 (×3): qty 1

## 2012-01-24 MED ORDER — ONDANSETRON HCL 4 MG/2ML IJ SOLN
INTRAMUSCULAR | Status: DC | PRN
  Administered 2012-01-24: 4 mg via INTRAVENOUS

## 2012-01-24 MED ORDER — FENTANYL CITRATE 0.05 MG/ML IJ SOLN
25.0000 ug | INTRAMUSCULAR | Status: DC | PRN
  Administered 2012-01-24 (×2): 50 ug via INTRAVENOUS

## 2012-01-24 MED ORDER — KCL IN DEXTROSE-NACL 20-5-0.45 MEQ/L-%-% IV SOLN
INTRAVENOUS | Status: DC
  Administered 2012-01-24 – 2012-01-25 (×3): via INTRAVENOUS
  Filled 2012-01-24 (×5): qty 1000

## 2012-01-24 MED ORDER — ASPIRIN EC 81 MG PO TBEC: 81.0000 mg | DELAYED_RELEASE_TABLET | Freq: Every day | ORAL | Status: DC

## 2012-01-24 MED ORDER — SENNA 8.6 MG PO TABS
1.0000 | ORAL_TABLET | Freq: Two times a day (BID) | ORAL | Status: DC
  Administered 2012-01-24 – 2012-01-25 (×3): 8.6 mg via ORAL
  Filled 2012-01-24 (×3): qty 1

## 2012-01-24 MED ORDER — MUPIROCIN 2 % EX OINT: TOPICAL_OINTMENT | Freq: Two times a day (BID) | CUTANEOUS | Status: DC

## 2012-01-24 MED ORDER — FENTANYL CITRATE 0.05 MG/ML IJ SOLN
INTRAMUSCULAR | Status: DC | PRN
  Administered 2012-01-24 (×3): 25 ug via INTRAVENOUS
  Administered 2012-01-24: 50 ug via INTRAVENOUS
  Administered 2012-01-24: 25 ug via INTRAVENOUS

## 2012-01-24 MED ORDER — FINASTERIDE 5 MG PO TABS
5.0000 mg | ORAL_TABLET | Freq: Every day | ORAL | Status: DC
  Administered 2012-01-24 – 2012-01-25 (×2): 5 mg via ORAL
  Filled 2012-01-24 (×3): qty 1

## 2012-01-24 MED ORDER — PROPOFOL 10 MG/ML IV BOLUS
INTRAVENOUS | Status: DC | PRN
  Administered 2012-01-24: 150 mg via INTRAVENOUS

## 2012-01-24 MED ORDER — ONDANSETRON HCL 4 MG/2ML IJ SOLN: 4.0000 mg | INTRAMUSCULAR | Status: DC | PRN

## 2012-01-24 MED ORDER — ACETAMINOPHEN 10 MG/ML IV SOLN
1000.0000 mg | Freq: Four times a day (QID) | INTRAVENOUS | Status: AC
  Administered 2012-01-24 – 2012-01-25 (×4): 1000 mg via INTRAVENOUS
  Filled 2012-01-24 (×4): qty 100

## 2012-01-24 MED ORDER — TIMOLOL MALEATE 0.5 % OP SOLN
1.0000 [drp] | Freq: Every morning | OPHTHALMIC | Status: DC
  Administered 2012-01-25: 1 [drp] via OPHTHALMIC
  Filled 2012-01-24: qty 5

## 2012-01-24 MED ORDER — MEPERIDINE HCL 50 MG/ML IJ SOLN: 6.2500 mg | INTRAMUSCULAR | Status: DC | PRN

## 2012-01-24 MED ORDER — ACETAMINOPHEN 10 MG/ML IV SOLN: 1000.0000 mg | Freq: Once | INTRAVENOUS | Status: DC | PRN

## 2012-01-24 SURGICAL SUPPLY — 20 items
BAG URINE DRAINAGE (UROLOGICAL SUPPLIES) ×2 IMPLANT
BAG URO CATCHER STRL LF (DRAPE) ×2 IMPLANT
BLADE SURG 15 STRL LF DISP TIS (BLADE) IMPLANT
BLADE SURG 15 STRL SS (BLADE)
CATH FOLEY 3WAY 30CC 22FR (CATHETERS) ×2 IMPLANT
CLOTH BEACON ORANGE TIMEOUT ST (SAFETY) ×2 IMPLANT
DRAPE CAMERA CLOSED 9X96 (DRAPES) ×2 IMPLANT
ELECT LOOP MED HF 24F 12D (CUTTING LOOP) ×2 IMPLANT
ELECT LOOP MED HF 24F 12D CBL (CLIP) IMPLANT
ELECT RESECT VAPORIZE 12D CBL (ELECTRODE) IMPLANT
GLOVE BIOGEL M STRL SZ7.5 (GLOVE) ×2 IMPLANT
GOWN STRL REIN XL XLG (GOWN DISPOSABLE) ×2 IMPLANT
HOLDER FOLEY CATH W/STRAP (MISCELLANEOUS) IMPLANT
KIT ASPIRATION TUBING (SET/KITS/TRAYS/PACK) IMPLANT
MANIFOLD NEPTUNE II (INSTRUMENTS) ×2 IMPLANT
PACK CYSTO (CUSTOM PROCEDURE TRAY) ×2 IMPLANT
SUT ETHILON 3 0 PS 1 (SUTURE) IMPLANT
SYR 30ML LL (SYRINGE) IMPLANT
SYRINGE IRR TOOMEY STRL 70CC (SYRINGE) ×2 IMPLANT
TUBING CONNECTING 10 (TUBING) ×2 IMPLANT

## 2012-01-24 NOTE — Brief Op Note (Signed)
01/24/2012  11:19 AM  PATIENT:  Craig Martin  79 y.o. male  PRE-OPERATIVE DIAGNOSIS:  URINARY RETENTION, BENIGN PROSTATIC HYPERTROPHY  POST-OPERATIVE DIAGNOSIS:  URINARY RETENTION, BENIGN PROSTATIC HYPERTROPHY  PROCEDURE:  Procedure(s) (LRB) with comments: TRANSURETHRAL RESECTION OF THE PROSTATE WITH GYRUS INSTRUMENTS (N/A)  SURGEON:  Surgeon(s) and Role:    * Sebastian Ache, MD - Primary  PHYSICIAN ASSISTANT:   ASSISTANTS: none   ANESTHESIA:   general  EBL:  Total I/O In: 600 [I.V.:600] Out: -   BLOOD ADMINISTERED:none  DRAINS: 57F Foley to NS irrigation   LOCAL MEDICATIONS USED:  NONE  SPECIMEN:  Source of Specimen:  prostate chips  DISPOSITION OF SPECIMEN:  PATHOLOGY  COUNTS:  YES  TOURNIQUET:  * No tourniquets in log *  DICTATION: .Other Dictation: Dictation Number [id number]  PLAN OF CARE: Admit for overnight observation  PATIENT DISPOSITION:  PACU - hemodynamically stable.   Delay start of Pharmacological VTE agent (>24hrs) due to surgical blood loss or risk of bleeding: yes

## 2012-01-24 NOTE — H&P (Signed)
Craig Martin is an 79 y.o. male.   Chief Complaint: Pre-OP Transurethral Resection of Prostate HPI:   1 - Urinary Retention / Prostatic Hypertrophy- Pt had foley placed 09/16/2011 in [facility] ER for inabilty to urinate. No prior episodes. He had stopped his flomax at some point recently prior. Placed on Flomax + Finasteride and passed trial of void 10/2011, but sound up in retention again 11/2011. UDS 12/2011 demonstrates liekly outflow obstruction with PDet >60 at Qm of 7. Some instability with filling, but no strong urge until 300+mL. Office cysto confirmed bilobar prostatic hypertrophy.  PMH sig for DM2, HTN, CHF, On Pradaxa.  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   Today Craig Martin is seen to proceed with TURP   Past Medical History  Diagnosis Date  . Glaucoma   . Bradycardia   . Diabetes mellitus   . Coronary artery disease   . Hyperlipidemia   . Hypertension   . Dysrhythmia     AFIB  - TAKES PRADAXA FOLLOWED BY DR. Allyson Sabal  . Shortness of breath     WITH EXERTION  . Stroke     "I had a stroke many yrs ago" -no residual problems  . BPH (benign prostatic hyperplasia)   . Difficulty voiding     Past Surgical History  Procedure Date  . Stented coronary artery 2004    No family history on file. Social History:  reports that he quit smoking about 26 years ago. He does not have any smokeless tobacco history on file. He reports that he does not drink alcohol or use illicit drugs.  Allergies: No Known Allergies  No prescriptions prior to admission    No results found for this or any previous visit (from the past 48 hour(s)). No results found.  Review of Systems  Constitutional: Negative.  Negative for fever and chills.  HENT: Negative.   Eyes: Negative.   Respiratory: Negative.   Cardiovascular: Negative.   Gastrointestinal: Negative.   Genitourinary: Negative.   Musculoskeletal:  Negative.   Skin: Negative.   Neurological: Negative.   Endo/Heme/Allergies: Negative.   Psychiatric/Behavioral: Negative.     There were no vitals taken for this visit. Physical Exam  Constitutional: He is oriented to person, place, and time. He appears well-developed and well-nourished.  HENT:  Head: Normocephalic and atraumatic.  Eyes: EOM are normal. Pupils are equal, round, and reactive to light.  Neck: Normal range of motion. Neck supple.  Cardiovascular: Normal rate.   Respiratory: Effort normal.  GI: Soft. Bowel sounds are normal.  Genitourinary: Penis normal.       Foley c/d/i with yellow urine  Musculoskeletal: Normal range of motion.  Neurological: He is alert and oriented to person, place, and time.  Skin: Skin is warm and dry.  Psychiatric: He has a normal mood and affect. His behavior is normal. Judgment and thought content normal.     Assessment/Plan  1 - Urinary Retention / Prostatic Hypertrophy-  Pt now clearly medical-refractory from outflow obstruction. We re-iscussed options of CIC, foley v. SPT, or outlet surgery with TURP.   We re-discussed options for medical refractory prostatic outlet obstruction including TURP, TUNA, TUMT, Green-Light, Ho-LEP, and simple prostatectomy with their respective risks, benefits, and long-term outcomes data. Pt has opted for TURP. We discussed the typical peri-operative course with overnight admission and discharge with foley in place with subsequent office voiding trial few days later. We discussed risks including  bleeding, infection, incontinence, need for repeat procedures / tissue regrowth over time as well as rare risks including DVT, PE, MI, CVA and mortality.    Craig Martin 01/24/2012, 7:21 AM

## 2012-01-24 NOTE — Anesthesia Preprocedure Evaluation (Addendum)
Anesthesia Evaluation  Patient identified by MRN, date of birth, ID band Patient awake    Reviewed: Allergy & Precautions, H&P , NPO status , Patient's Chart, lab work & pertinent test results  Airway Mallampati: II TM Distance: >3 FB Neck ROM: Full    Dental  (+) Dental Advisory Given and Missing   Pulmonary shortness of breath and with exertion, former smoker,  breath sounds clear to auscultation  Pulmonary exam normal       Cardiovascular hypertension, Pt. on medications + CAD and + Cardiac Stents + dysrhythmias Atrial Fibrillation Rhythm:Irregular Rate:Normal     Neuro/Psych CVA, No Residual Symptoms negative psych ROS   GI/Hepatic negative GI ROS, Neg liver ROS,   Endo/Other  diabetes, Type 2, Oral Hypoglycemic Agents  Renal/GU negative Renal ROS     Musculoskeletal negative musculoskeletal ROS (+)   Abdominal   Peds  Hematology negative hematology ROS (+)   Anesthesia Other Findings   Reproductive/Obstetrics                         Anesthesia Physical Anesthesia Plan  ASA: III  Anesthesia Plan: General   Post-op Pain Management:    Induction: Intravenous  Airway Management Planned: LMA  Additional Equipment:   Intra-op Plan:   Post-operative Plan: Extubation in OR  Informed Consent: I have reviewed the patients History and Physical, chart, labs and discussed the procedure including the risks, benefits and alternatives for the proposed anesthesia with the patient or authorized representative who has indicated his/her understanding and acceptance.   Dental advisory given  Plan Discussed with: CRNA  Anesthesia Plan Comments:         Anesthesia Quick Evaluation

## 2012-01-24 NOTE — Plan of Care (Signed)
Pt examined in PM and with significant hematuria. His catheter was not on tension. He was clear in PACU.  Catheter exchanged for 3-way hematuria and hand-irrigated to light pink. 50cc in balloon and placed on light tension with efflux medium pink on CBI.  H/H in AM. And T+C 2 units in case sig decline in AM.  NO surgical intervention warranted at this time as is already clearing with conservative measures.

## 2012-01-24 NOTE — Anesthesia Postprocedure Evaluation (Signed)
Anesthesia Post Note  Patient: Craig Martin  Procedure(s) Performed: Procedure(s) (LRB): TRANSURETHRAL RESECTION OF THE PROSTATE WITH GYRUS INSTRUMENTS (N/A)  Anesthesia type: General  Patient location: PACU  Post pain: Pain level controlled  Post assessment: Post-op Vital signs reviewed  Last Vitals: BP 158/67  Pulse 63  Temp 36.4 C (Oral)  Resp 8  Ht 5\' 9"  (1.753 m)  Wt 180 lb (81.647 kg)  BMI 26.58 kg/m2  SpO2 100%  Post vital signs: Reviewed  Level of consciousness: sedated  Complications: No apparent anesthesia complications

## 2012-01-24 NOTE — Transfer of Care (Signed)
Immediate Anesthesia Transfer of Care Note  Patient: Craig Martin  Procedure(s) Performed: Procedure(s) (LRB) with comments: TRANSURETHRAL RESECTION OF THE PROSTATE WITH GYRUS INSTRUMENTS (N/A)  Patient Location: PACU  Anesthesia Type:General  Level of Consciousness: awake and patient cooperative  Airway & Oxygen Therapy: Patient Spontanous Breathing and Patient connected to face mask oxygen  Post-op Assessment: Report given to PACU RN and Post -op Vital signs reviewed and stable  Post vital signs: Reviewed and stable  Complications: No apparent anesthesia complications

## 2012-01-25 ENCOUNTER — Encounter (HOSPITAL_COMMUNITY): Payer: Self-pay | Admitting: Urology

## 2012-01-25 LAB — HEMOGLOBIN AND HEMATOCRIT, BLOOD: Hemoglobin: 9.5 g/dL — ABNORMAL LOW (ref 13.0–17.0)

## 2012-01-25 MED ORDER — BACITRACIN-NEOMYCIN-POLYMYXIN 400-5-5000 EX OINT: 1.0000 "application " | TOPICAL_OINTMENT | Freq: Three times a day (TID) | CUTANEOUS | Status: DC | PRN

## 2012-01-25 NOTE — Progress Notes (Signed)
1 Day Post-Op  Subjective:  Prostatic Hyperplasia + Urinary Retention - POD 1 s/p transurethral resection of prostate. Pt required catheter replacement and hand irrigation last PM but no problems since. Hgb today 9.5 and stable. Pain controlled, tolerating diet. Walked this AM already.  Objective: Vital signs in last 24 hours: Temp:  [96.8 F (36 C)-97.8 F (36.6 C)] 97.4 F (36.3 C) (01/23 0529) Pulse Rate:  [46-83] 82  (01/23 0529) Resp:  [8-20] 18  (01/23 0529) BP: (120-170)/(53-106) 141/75 mmHg (01/23 0529) SpO2:  [99 %-100 %] 100 % (01/23 0529) Weight:  [81.64 kg (179 lb 15.7 oz)-81.647 kg (180 lb)] 81.64 kg (179 lb 15.7 oz) (01/22 1338) Last BM Date: 01/23/12  Intake/Output from previous day: 01/22 0701 - 01/23 0700 In: 16109 [P.O.:720; I.V.:1190; IV Piggyback:450] Out: 60454 [Urine:16425] Intake/Output this shift:    General appearance: alert, cooperative and appears stated age Head: Normocephalic, without obvious abnormality, atraumatic Eyes: conjunctivae/corneas clear. PERRL, EOM's intact. Fundi benign. Ears: normal TM's and external ear canals both ears Nose: Nares normal. Septum midline. Mucosa normal. No drainage or sinus tenderness. Throat: lips, mucosa, and tongue normal; teeth and gums normal Neck: no adenopathy, no carotid bruit, no JVD, supple, symmetrical, trachea midline and thyroid not enlarged, symmetric, no tenderness/mass/nodules Back: symmetric, no curvature. ROM normal. No CVA tenderness. Resp: clear to auscultation bilaterally Chest wall: no tenderness Cardio: regular rate and rhythm, S1, S2 normal, no murmur, click, rub or gallop GI: soft, non-tender; bowel sounds normal; no masses,  no organomegaly Male genitalia: normal, 3 way foely c/d/i on NS irrigation with light pink efflux. No clots.  Extremities: extremities normal, atraumatic, no cyanosis or edema Pulses: 2+ and symmetric Skin: Skin color, texture, turgor normal. No rashes or lesions Lymph  nodes: Cervical, supraclavicular, and axillary nodes normal. Neurologic: Grossly normal  Lab Results:   Basename 01/25/12 0435 01/24/12 1137  WBC -- --  HGB 9.5* 9.4*  HCT 28.8* 28.5*  PLT -- --   BMET No results found for this basename: NA:2,K:2,CL:2,CO2:2,GLUCOSE:2,BUN:2,CREATININE:2,CALCIUM:2 in the last 72 hours PT/INR  Basename 01/24/12 0815  LABPROT 14.0  INR 1.09   ABG No results found for this basename: PHART:2,PCO2:2,PO2:2,HCO3:2 in the last 72 hours  Studies/Results: No results found.  Anti-infectives: Anti-infectives     Start     Dose/Rate Route Frequency Ordered Stop   01/24/12 1500   ampicillin (OMNIPEN) 1 g in sodium chloride 0.9 % 50 mL IVPB        1 g 150 mL/hr over 20 Minutes Intravenous Every 6 hours 01/24/12 1355 01/25/12 0335   01/24/12 0755   gentamicin (GARAMYCIN) IVPB 80 mg        80 mg 100 mL/hr over 30 Minutes Intravenous 30 min pre-op 01/24/12 0755 01/24/12 1026          Assessment/Plan:  Prostatic Hyperplasia + Urinary Retention - Doing well POD 1. Will observe and additional day as he had problem with some significant hematuria yesterday. Ambulate and wean CBI to off today.    [facility], Leimomi Zervas 01/25/2012

## 2012-01-25 NOTE — Op Note (Signed)
Craig Martin, Craig Martin NO.:  [id number]  MEDICAL RECORD NO.:  [id number]  LOCATION:  1437                         FACILITY:  [facility]  PHYSICIAN:  Sebastian Ache, MD     DATE OF BIRTH:  05-21-1926  DATE OF PROCEDURE:  01/24/2012 DATE OF DISCHARGE:                              OPERATIVE REPORT   PREOPERATIVE DIAGNOSES:  Urinary retention, prostatic hypertrophy, outlet obstruction.  PROCEDURE:  Transurethral resection of prostate.  FINDINGS: 1. Trilobar prostatic hypertrophy. 2. Excellent efflux of blue tinged urine from bilateral ureteral     orifices following resection.  SPECIMEN:  Prostate chips for pathology.  DRAINS:  A 22-French Foley catheter to normal saline irrigation, efflux light pink.  COMPLICATIONS:  None.  ESTIMATED BLOOD LOSS:  100 mL.  INDICATIONS:  Craig Martin is a pleasant 79 year old gentleman with recent history of urinary retention for the past several months.  He underwent empiric therapy with Flomax and finasteride; however, he failed trial of void x2.  He underwent subsequent urodynamics which confirmed mild obstruction by preserve bladder contractions in the office cystoscopy which corroborated trilobar prostatic hypertrophy. Options were discussed including various forms of permanent catheters versus various outlet procedure and he wished to proceed with the latter with transurethral resection of prostate.  PROCEDURE IN DETAIL:  The patient being Craig Martin was verified. Procedure being transurethral resection of prostate was confirmed. Procedure was carried out.  Time-out was performed.  Intravenous antibiotics administered.  General LMA anesthesia was introduced.  The patient was placed into a low lithotomy position.  Sterile field created by prepping and draping the patient's penis, perineum, and proximal thighs using iodine x3.  Next, cystourethroscopy was performed using a 26-French ACMI resectoscope sheath with  visual obturator.  Inspection of the anterior and posterior urethra revealed only a trilobar prostatic hypertrophy.  Inspection of bladder revealed no diverticula, calcifications, papular lesions.  Ureteral orifices in the normal anatomic position.  This did come relatively close to the median lobe. As such, indigo carmine was given.  Next, the bipolar resectoscope loop was used to first resect the median lobe down flush to the bladder wall and then in a top down approach, all obstructing adenomatous tissue was resected from the bladder neck towards the area of the verumontanum taking great care not to resect distal to this.  Dissection was carried down to what appeared to be the circular fibrous tissue of the prostatic capsule.  Prostatic chips were irrigated out and set aside for permanent pathology.  Additional coagulation current was used for hemostasis. Inspection after resection revealed excellent urinary channel.  There was vigorous efflux of blue-colored urine from bilateral ureteral orifices indicating no ureteral injury.  The resectoscope was exchanged for a new 22-French 3-way Foley catheter with normal saline irrigation.  Efflux was light pink.  Procedure was then terminated.  The patient tolerated the procedure well.  There were no immediate periprocedural complications.  The patient was taken to the postanesthesia care unit in stable condition.          ______________________________ Sebastian Ache, MD     TM/MEDQ  D:  01/24/2012  T:  01/25/2012  Job:  [id number]

## 2012-01-26 MED ORDER — TRAMADOL HCL 50 MG PO TABS: 50.0000 mg | ORAL_TABLET | Freq: Four times a day (QID) | ORAL | Status: DC | PRN

## 2012-01-26 MED ORDER — SENNA-DOCUSATE SODIUM 8.6-50 MG PO TABS: 1.0000 | ORAL_TABLET | Freq: Two times a day (BID) | ORAL | Status: DC

## 2012-01-26 NOTE — Discharge Summary (Signed)
Physician Discharge Summary  Patient ID: Craig Martin MRN: [id number] DOB/AGE: 77-Jun-1928 77 y.o.  Admit date: 01/24/2012 Discharge date: 01/26/2012  Admission Diagnoses: Urinary Retention, Prostatic Hypertrophy  Discharge Diagnoses: Urinary Retention, Prostatic Hypertrophy   Discharged Condition: good  Hospital Course:   Pt underwent transurethral resection of the prostate on the day of admission without acute complications. He was admitted to the 4 th floor Urology service post-op where he began hi recovery. ON the evening of POD 0 he developed some worsening hematuria with clots and required bedside irrigation and exchange for hematuria catheter. By POD2, the day of discharge, his urine was clear, he was ambulatory / tolerating diet / pain controlled, Hgb >9 and felt to be adequate for discharge. He held his pradaxa pre-op with plan to hold until trial of void next week.  Consults: None  Significant Diagnostic Studies: labs: Hgb 9.5 (stable by most recent check)  Treatments: surgery: transurethral resection of the prostate  Discharge Exam: Blood pressure 140/65, pulse 79, temperature 98.3 F (36.8 C), temperature source Oral, resp. rate 18, height 5\' 9"  (1.753 m), weight 81.64 kg (179 lb 15.7 oz), SpO2 100.00%. General appearance: alert, cooperative and appears stated age Head: Normocephalic, without obvious abnormality, atraumatic Eyes: conjunctivae/corneas clear. PERRL, EOM's intact. Fundi benign. Ears: normal TM's and external ear canals both ears Nose: Nares normal. Septum midline. Mucosa normal. No drainage or sinus tenderness. Throat: lips, mucosa, and tongue normal; teeth and gums normal Neck: no adenopathy, no carotid bruit, no JVD, supple, symmetrical, trachea midline and thyroid not enlarged, symmetric, no tenderness/mass/nodules Back: symmetric, no curvature. ROM normal. No CVA tenderness. Resp: clear to auscultation bilaterally Cardio: regular rate and rhythm, S1,  S2 normal, no murmur, click, rub or gallop GI: soft, non-tender; bowel sounds normal; no masses,  no organomegaly Male genitalia: normal, foley c/d/i with yellow urine off irrigation. Extremities: extremities normal, atraumatic, no cyanosis or edema Pulses: 2+ and symmetric Skin: Skin color, texture, turgor normal. No rashes or lesions Lymph nodes: Cervical, supraclavicular, and axillary nodes normal. Neurologic: Grossly normal  Disposition: 01-Home or Self Care     Medication List     As of 01/26/2012  8:23 AM    STOP taking these medications         dabigatran 75 MG Caps   Commonly known as: PRADAXA      Tamsulosin HCl 0.4 MG Caps   Commonly known as: FLOMAX      TAKE these medications         ampicillin 250 MG capsule   Commonly known as: PRINCIPEN   Take 250 mg by mouth 2 (two) times daily. TO TAKE 1 CAPSULE BY MOUTH TWICE DAILY FOR 6 DAYS - BEGIN 3 DAYS BEFORE SURGERY      aspirin EC 81 MG tablet   Take 81 mg by mouth daily.      bimatoprost 0.03 % ophthalmic solution   Commonly known as: LUMIGAN   Place 1 drop into both eyes at bedtime.      finasteride 5 MG tablet   Commonly known as: PROSCAR   Take 5 mg by mouth daily.      furosemide 20 MG tablet   Commonly known as: LASIX   Take 20 mg by mouth daily.      linagliptin 5 MG Tabs tablet   Commonly known as: TRADJENTA   Take 5 mg by mouth daily.      losartan 25 MG tablet   Commonly known as: COZAAR  Take 25 mg by mouth daily.      sennosides-docusate sodium 8.6-50 MG tablet   Commonly known as: SENOKOT-S   Take 1 tablet by mouth 2 (two) times daily. While taking pain meds to prevent constipation.      timolol 0.5 % ophthalmic solution   Commonly known as: TIMOPTIC   Place 1 drop into both eyes every morning.      traMADol 50 MG tablet   Commonly known as: ULTRAM   Take 1 tablet (50 mg total) by mouth every 6 (six) hours as needed for pain.           Follow-up Information    Follow up with  Sebastian Ache, MD. On 01/29/2012. (9:45 AM for MD visit and catheter removal)    Contact information:   [address] [place] [address] [phone]          Signed: Sebastian Ache 01/26/2012, 8:23 AM

## 2012-01-28 LAB — TYPE AND SCREEN
ABO/RH(D): O POS
Antibody Screen: NEGATIVE
Unit division: 0

## 2012-05-23 ENCOUNTER — Encounter (HOSPITAL_BASED_OUTPATIENT_CLINIC_OR_DEPARTMENT_OTHER): Payer: Medicare Other | Attending: Internal Medicine

## 2012-05-23 DIAGNOSIS — L97509 Non-pressure chronic ulcer of other part of unspecified foot with unspecified severity: Secondary | ICD-10-CM | POA: Insufficient documentation

## 2012-05-23 DIAGNOSIS — E1149 Type 2 diabetes mellitus with other diabetic neurological complication: Secondary | ICD-10-CM | POA: Insufficient documentation

## 2012-05-23 DIAGNOSIS — E1169 Type 2 diabetes mellitus with other specified complication: Secondary | ICD-10-CM | POA: Insufficient documentation

## 2012-05-23 DIAGNOSIS — I739 Peripheral vascular disease, unspecified: Secondary | ICD-10-CM | POA: Insufficient documentation

## 2012-05-23 DIAGNOSIS — E1142 Type 2 diabetes mellitus with diabetic polyneuropathy: Secondary | ICD-10-CM | POA: Insufficient documentation

## 2012-05-24 ENCOUNTER — Other Ambulatory Visit (HOSPITAL_COMMUNITY): Payer: Self-pay | Admitting: Internal Medicine

## 2012-05-24 DIAGNOSIS — I739 Peripheral vascular disease, unspecified: Secondary | ICD-10-CM

## 2012-05-24 NOTE — Progress Notes (Signed)
[facility]  NAME:  GENERAL, WEARING NO.:  [id number]  MEDICAL RECORD NO.:  [id number]      DATE OF BIRTH:  04-Dec-1926  PHYSICIAN:  Maxwell Caul, M.D.      VISIT DATE:                                  OFFICE VISIT   CHIEF COMPLAINT:  Review of wounds on the left lateral foot and plantar surface.  HISTORY OF PRESENT ILLNESS:  Mr. Craig Martin is an 79 year old diabetic followed in primary care by Dr. Renae Gloss.  He arrives for our review of wounds on the lateral aspect of his left foot and a smaller area over the plantar aspect, close to the original wound.  He tells me that this area may have been traumatized in 2002 with what sounds like a hematoma after falling on the ice.  He kept this dressed at home on his own and it eventually healed over.  The current wound has been there for roughly 8 months.  Once again, I believe he has been tending to this on his own.  A smaller area opened up after the original on the plantar aspect of the foot just very close to the original wound.  He is a diabetic, does not know whether he has any PAD or neuropathy.  PAST MEDICAL HISTORY/PROBLEM LIST: 1. Glaucoma. 2. Atrial fibrillation on Pradaxa. 3. Type 2 diabetes. 4. Coronary artery disease. 5. Hyperlipidemia. 6. Hypertension. 7. History of stroke many years ago. 8. BPH.  PAST SURGICAL HISTORY:  Stented coronary artery in 2004.  He underwent a TURP on January 24, 2012, due to urinary retention and prostatic hypertrophy.  CURRENT MEDICATIONS:  Pradaxa 75 b.i.d., Lasix 20 daily, ASA 81 daily.  REVIEW OF SYSTEMS:  RESPIRATORY:  No shortness of breath.  CARDIAC:  No exertional chest pain.  EXTREMITIES:  He does not describe claudication nor does he easily describe neuropathic-type symptoms.  PHYSICAL EXAMINATION:  VITAL SIGNS:  Temperature 97.6, pulse 84, respirations 16, blood pressure 178/88.  CBG 100. RESPIRATORY:  Routine dictation. CARDIAC:   Heart sounds normal.  No murmurs. NECK:  No elevation in jugular venous pressure. EXTREMITIES:  I could not feel peripheral pulses at the dorsalis pedis, posterior tibial, or popliteal.  As well he has complete loss of vibration sense in the right foot.  WOUND EXAM:  On the lateral aspect of the right foot just distal to the calcaneus was a regular wound measuring 6.3 x 2.3 x 0.1.  This is actually hypergranulated with a fibrinous surface.  The entire area was surgically debrided, removing nonviable subcutaneous tissue as well as hypergranulation.  The wound on the plantar aspect of the foot is much smaller, was also similarly debrided.  There was no evidence of infection, nothing needed culturing.  I should mention that I believe there is surrounding scar tissue here probably from the originally described injury in 2002.  IMPRESSION: 1. Wagner's grade 2 diabetic foot ulcers.  The debridement as noted     above dressed with silver alginate, foam, gauze, Kerlix, and net.     We will try and keep this in place the next week.  We provided him     with an off-loading healing sandal. 2. Diabetic neuropathy with complete loss of vibration sense in most  of the right foot, although his light touch seemed much better. 3. Probable diabetic macrovascular disease with PAD.  We have arranged     for him to have arterial Dopplers at [facility]     as well as ABIs.  We will see him again in a week's time.          ______________________________ Maxwell Caul, M.D.     MGR/MEDQ  D:  05/23/2012  T:  05/24/2012  Job:  [id number]

## 2012-05-28 ENCOUNTER — Ambulatory Visit (HOSPITAL_COMMUNITY)
Admission: RE | Admit: 2012-05-28 | Discharge: 2012-05-28 | Disposition: A | Discharge status: Home / Self Care | Payer: Medicare Other | Source: Ambulatory Visit | Attending: Cardiovascular Disease | Admitting: Cardiovascular Disease

## 2012-05-28 DIAGNOSIS — L97509 Non-pressure chronic ulcer of other part of unspecified foot with unspecified severity: Secondary | ICD-10-CM

## 2012-05-28 DIAGNOSIS — I739 Peripheral vascular disease, unspecified: Secondary | ICD-10-CM | POA: Insufficient documentation

## 2012-05-28 NOTE — Progress Notes (Signed)
Arterial Lower Ext. Duplex Completed. Adelene Polivka, RDMS, RVT  

## 2012-05-29 ENCOUNTER — Encounter: Payer: Self-pay | Admitting: *Deleted

## 2012-05-29 ENCOUNTER — Encounter: Payer: Self-pay | Admitting: Cardiovascular Disease

## 2012-05-30 ENCOUNTER — Ambulatory Visit: Payer: Medicare Other | Admitting: Cardiovascular Disease

## 2012-06-06 ENCOUNTER — Encounter (HOSPITAL_BASED_OUTPATIENT_CLINIC_OR_DEPARTMENT_OTHER): Payer: Medicare Other | Attending: Internal Medicine

## 2012-06-06 DIAGNOSIS — E1169 Type 2 diabetes mellitus with other specified complication: Secondary | ICD-10-CM | POA: Insufficient documentation

## 2012-06-06 DIAGNOSIS — L97509 Non-pressure chronic ulcer of other part of unspecified foot with unspecified severity: Secondary | ICD-10-CM | POA: Insufficient documentation

## 2012-07-04 ENCOUNTER — Encounter (HOSPITAL_BASED_OUTPATIENT_CLINIC_OR_DEPARTMENT_OTHER): Payer: Medicare Other | Attending: General Surgery

## 2012-07-04 DIAGNOSIS — E1169 Type 2 diabetes mellitus with other specified complication: Secondary | ICD-10-CM | POA: Insufficient documentation

## 2012-07-04 DIAGNOSIS — L97509 Non-pressure chronic ulcer of other part of unspecified foot with unspecified severity: Secondary | ICD-10-CM | POA: Insufficient documentation

## 2012-08-08 ENCOUNTER — Encounter (HOSPITAL_BASED_OUTPATIENT_CLINIC_OR_DEPARTMENT_OTHER): Payer: Medicare Other | Attending: Internal Medicine

## 2012-08-08 ENCOUNTER — Other Ambulatory Visit (HOSPITAL_BASED_OUTPATIENT_CLINIC_OR_DEPARTMENT_OTHER): Payer: Self-pay | Admitting: Internal Medicine

## 2012-08-08 ENCOUNTER — Ambulatory Visit (HOSPITAL_COMMUNITY)
Admission: RE | Admit: 2012-08-08 | Discharge: 2012-08-08 | Disposition: A | Discharge status: Home / Self Care | Payer: Medicare Other | Source: Ambulatory Visit | Attending: Internal Medicine | Admitting: Internal Medicine

## 2012-08-08 DIAGNOSIS — E119 Type 2 diabetes mellitus without complications: Secondary | ICD-10-CM | POA: Insufficient documentation

## 2012-08-08 DIAGNOSIS — X58XXXA Exposure to other specified factors, initial encounter: Secondary | ICD-10-CM | POA: Insufficient documentation

## 2012-08-08 DIAGNOSIS — S8990XA Unspecified injury of unspecified lower leg, initial encounter: Secondary | ICD-10-CM | POA: Insufficient documentation

## 2012-08-08 DIAGNOSIS — M869 Osteomyelitis, unspecified: Secondary | ICD-10-CM

## 2012-08-08 DIAGNOSIS — L97509 Non-pressure chronic ulcer of other part of unspecified foot with unspecified severity: Secondary | ICD-10-CM | POA: Insufficient documentation

## 2012-08-08 DIAGNOSIS — S99929A Unspecified injury of unspecified foot, initial encounter: Secondary | ICD-10-CM | POA: Insufficient documentation

## 2012-08-08 DIAGNOSIS — M899 Disorder of bone, unspecified: Secondary | ICD-10-CM | POA: Insufficient documentation

## 2012-08-08 DIAGNOSIS — E1169 Type 2 diabetes mellitus with other specified complication: Secondary | ICD-10-CM | POA: Insufficient documentation

## 2012-08-19 ENCOUNTER — Ambulatory Visit: Payer: Medicare Other | Admitting: Cardiovascular Disease

## 2012-08-19 ENCOUNTER — Encounter: Payer: Self-pay | Admitting: Cardiovascular Disease

## 2012-08-19 ENCOUNTER — Ambulatory Visit (INDEPENDENT_AMBULATORY_CARE_PROVIDER_SITE_OTHER): Payer: Medicare Other | Admitting: Cardiovascular Disease

## 2012-08-19 VITALS — BP 148/68 | HR 80 | Ht 69.0 in | Wt 176.0 lb

## 2012-08-19 DIAGNOSIS — E785 Hyperlipidemia, unspecified: Secondary | ICD-10-CM

## 2012-08-19 DIAGNOSIS — E119 Type 2 diabetes mellitus without complications: Secondary | ICD-10-CM | POA: Insufficient documentation

## 2012-08-19 DIAGNOSIS — I482 Chronic atrial fibrillation, unspecified: Secondary | ICD-10-CM

## 2012-08-19 DIAGNOSIS — I999 Unspecified disorder of circulatory system: Secondary | ICD-10-CM

## 2012-08-19 DIAGNOSIS — I998 Other disorder of circulatory system: Secondary | ICD-10-CM

## 2012-08-19 DIAGNOSIS — I10 Essential (primary) hypertension: Secondary | ICD-10-CM

## 2012-08-19 DIAGNOSIS — I2510 Atherosclerotic heart disease of native coronary artery without angina pectoris: Secondary | ICD-10-CM

## 2012-08-19 DIAGNOSIS — I4891 Unspecified atrial fibrillation: Secondary | ICD-10-CM

## 2012-08-19 NOTE — Assessment & Plan Note (Signed)
Patient has a nonhealing ulcer on the lateral aspect of his right heel. This has been there for 2-3 months and is being treated at the [facility] by Dr. Ardath Sax. He had arterial Dopplers performed in our office 05/29/12 that did show a right ABI of 1.2 with calcified noncompressible vessels. He appeared to have an illusion of the proximal segment of the right posterior tibial. His right TBI was 0.33 suggesting poor perfusion to his foot. I suspect that his right heel ulcer will not heal with aggressive debridement and consent and local care. He does have moderate renal insufficiency and as diabetic. I suspect he'll need angiography with limited contrast to define his anatomy and potentially improve tibial circulation to promote healing. I will discuss this with Dr. Jimmey Ralph.

## 2012-08-19 NOTE — Progress Notes (Signed)
08/19/2012 Craig Martin   1926-05-18  [id number]  Primary Physician Craig Aliment, MD Primary Cardiologist: Craig Gess MD Craig Martin   HPI:  The patient is a very pleasant 79 year old thin-appearing single [place] American male with no children whom I last saw in the office 6 months ago. He has a history of CAD, status post circumflex stenting by Dr. Lenise Martin in 2004 with moderate LAD and RCA disease and normal LV function. His other problems include non-insulin-requiring diabetes, hypertension, and hyperlipidemia. He had a stress test done May 24, 2011, that showed inferior scar without ischemia and 2D echo revealed normal LV function. He denies chest pain or shortness of breath. He does have AFib with controlled ventricular response on Pradaxa. I saw Craig Martin in the office 12/29/11. He's developed a poorly healing ulcer on the lateral aspect of his right heel of several months and has been treated at the [facility]. He had arterial Dopplers performed in our office 05/29/12 which revealed a right ABI 1.2 but calcified noncompressible vessels with an occluded posterior tibial and proximal portion reconstituted. I'm concerned that he is ulcer may not heal based on his posterior tibial disease. His right ABI was 0.33 suggesting some post perfusion for healing.      Current Outpatient Prescriptions  Medication Sig Dispense Refill  . aspirin EC 81 MG tablet Take 81 mg by mouth daily.      . bimatoprost (LUMIGAN) 0.03 % ophthalmic solution Place 1 drop into both eyes at bedtime.      . dabigatran (PRADAXA) 75 MG CAPS Take by mouth every 12 (twelve) hours.      . furosemide (LASIX) 20 MG tablet Take 20 mg by mouth daily.      Craig Martin (TRADJENTA) 5 MG TABS tablet Take 5 mg by mouth daily.      Craig Kitchen losartan (COZAAR) 25 MG tablet Take 25 mg by mouth daily.       No current facility-administered medications for this visit.    No Known  Allergies  History   Social History  . Marital Status: Single    Spouse Name: N/A    Number of Children: N/A  . Years of Education: N/A   Occupational History  . Not on file.   Social History Main Topics  . Smoking status: Former Smoker -- 1.50 packs/day    Types: Cigarettes    Quit date: 01/16/1986  . Smokeless tobacco: Former Neurosurgeon  . Alcohol Use: No  . Drug Use: No  . Sexual Activity: Not on file   Other Topics Concern  . Not on file   Social History Narrative  . No narrative on file     Review of Systems: General: negative for chills, fever, night sweats or weight changes.  Cardiovascular: negative for chest pain, dyspnea on exertion, edema, orthopnea, palpitations, paroxysmal nocturnal dyspnea or shortness of breath Dermatological: negative for rash Respiratory: negative for cough or wheezing Urologic: negative for hematuria Abdominal: negative for nausea, vomiting, diarrhea, bright red blood per rectum, melena, or hematemesis Neurologic: negative for visual changes, syncope, or dizziness All other systems reviewed and are otherwise negative except as noted above.    Blood pressure 148/68, pulse 80, height 5\' 9"  (1.753 m), weight 176 lb (79.833 kg).  General appearance: alert and no distress Neck: no adenopathy, no carotid bruit, no JVD, supple, symmetrical, trachea midline and thyroid not enlarged, symmetric, no tenderness/mass/nodules Lungs: clear to auscultation bilaterally Heart: regular rate and rhythm,  S1, S2 normal, no murmur, click, rub or gallop Extremities: extremities normal, atraumatic, no cyanosis or edema and 3-4 cm ischemic appearing ulcer on the lateral aspect of his right heel. There were absent pedal pulses  EKG not performed today  ASSESSMENT AND PLAN:   Coronary artery disease History of CAD status post circumflex stenting by Craig Martin in 2004 with moderate LAD and RCA disease and normal LV function. He is just test on 05/24/11 that  showed inferior scar without ischemia a 2-D echo that showed normal LV function. He has chronic shortness of breath but denies chest pain  Chronic atrial fibrillation Rate controlled on Pradaxa anticoagulation  Critical lower limb ischemia Patient has a nonhealing ulcer on the lateral aspect of his right heel. This has been there for 2-3 months and is being treated at the [facility] by Dr. Ardath Martin. He had arterial Dopplers performed in our office 05/29/12 that did show a right ABI of 1.2 with calcified noncompressible vessels. He appeared to have an illusion of the proximal segment of the right posterior tibial. His right TBI was 0.33 suggesting poor perfusion to his foot. I suspect that his right heel ulcer will not heal with aggressive debridement and consent and local care. He does have moderate renal insufficiency and as diabetic. I suspect he'll need angiography with limited contrast to define his anatomy and potentially improve tibial circulation to promote healing. I will discuss this with Craig Martin.      Craig Gess MD FACP,FACC,FAHA, Craig Martin 08/19/2012 3:57 PM

## 2012-08-19 NOTE — Assessment & Plan Note (Signed)
History of CAD status post circumflex stenting by Dr. Claudius Sis in 2004 with moderate LAD and RCA disease and normal LV function. He is just test on 05/24/11 that showed inferior scar without ischemia a 2-D echo that showed normal LV function. He has chronic shortness of breath but denies chest pain

## 2012-08-19 NOTE — Assessment & Plan Note (Signed)
Rate controlled on Pradaxa anticoagulation

## 2012-08-19 NOTE — Patient Instructions (Addendum)
Your physician wants you to follow-up in: one month with Dr Allyson Sabal.  You will receive a reminder letter in the mail two months in advance. If you don't receive a letter, please call our office to schedule the follow-up appointment.

## 2012-09-05 ENCOUNTER — Encounter (HOSPITAL_BASED_OUTPATIENT_CLINIC_OR_DEPARTMENT_OTHER): Payer: Medicare Other | Attending: Internal Medicine

## 2012-09-05 ENCOUNTER — Other Ambulatory Visit (HOSPITAL_BASED_OUTPATIENT_CLINIC_OR_DEPARTMENT_OTHER): Payer: Self-pay | Admitting: Internal Medicine

## 2012-09-05 DIAGNOSIS — I2510 Atherosclerotic heart disease of native coronary artery without angina pectoris: Secondary | ICD-10-CM | POA: Insufficient documentation

## 2012-09-05 DIAGNOSIS — E119 Type 2 diabetes mellitus without complications: Secondary | ICD-10-CM | POA: Insufficient documentation

## 2012-09-05 DIAGNOSIS — C4370 Malignant melanoma of unspecified lower limb, including hip: Secondary | ICD-10-CM | POA: Insufficient documentation

## 2012-09-05 DIAGNOSIS — I10 Essential (primary) hypertension: Secondary | ICD-10-CM | POA: Insufficient documentation

## 2012-09-05 DIAGNOSIS — H409 Unspecified glaucoma: Secondary | ICD-10-CM | POA: Insufficient documentation

## 2012-09-24 NOTE — Progress Notes (Signed)
[facility]  NAME:  Craig Martin, Craig Martin             ACCOUNT NO.:  [id number]  MEDICAL RECORD NO.:  [id number]      DATE OF BIRTH:  20-Oct-1926  PHYSICIAN:  Wayland Denis, DO       VISIT DATE:  09/23/2012                                  OFFICE VISIT   CHIEF COMPLAINT:  Melanoma.  HISTORY OF PRESENT ILLNESS:  The patient is an 79 year old gentleman who is here for evaluation and treatment of his right foot melanoma.  He had a 3 mm punch biopsy done on September 05, 2012.  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  He has been dealing with the wound for several months without improvement.  The area is approximately 4 cm by 3 cm in size on the right lateral foot area.  PAST MEDICAL HISTORY:  Positive for diabetes, glaucoma, hypertension, coronary artery disease.  PAST SURGICAL HISTORY:  Cardiac catheterization and prostate surgery.  MEDICATIONS:  Pradaxa, furosemide, aspirin, Tradjenta, losartan.  ALLERGIES:  No known drug allergies.  SOCIAL HISTORY:  He lives at home and takes care of his brother who is in a wheelchair.  REVIEW OF SYSTEMS:  Otherwise negative.  PHYSICAL EXAMINATION:  GENERAL:  On exam, he is alert and oriented, cooperative, not in any acute distress. HEENT:  He is pleasant.  Pupils are equal.  Extraocular muscles are intact. NECK:  No cervical lymphadenopathy. LUNGS:  His breathing is unlabored. HEART:  Rate is regular.  He has a very large lymph node noted in the right groin area.  The melanoma size is noted above.  His peripheral pulses are equal bilaterally.  I spoke with Dr. Violeta Gelinas.  We are going to work together.  He will likely need a sentinel lymph node biopsy with excision and delayed closure until the pathology is back.  We will continue with [facility] for the moment.     Wayland Denis,  DO     CS/MEDQ  D:  09/23/2012  T:  09/24/2012  Job:  [id number]

## 2012-09-25 ENCOUNTER — Encounter (INDEPENDENT_AMBULATORY_CARE_PROVIDER_SITE_OTHER): Payer: Self-pay | Admitting: General Surgery

## 2012-09-25 ENCOUNTER — Telehealth (HOSPITAL_COMMUNITY): Payer: Self-pay | Admitting: *Deleted

## 2012-09-25 ENCOUNTER — Ambulatory Visit (INDEPENDENT_AMBULATORY_CARE_PROVIDER_SITE_OTHER): Payer: Medicare Other | Admitting: General Surgery

## 2012-09-25 VITALS — BP 170/82 | HR 60 | Temp 97.6°F | Resp 12 | Ht 69.0 in | Wt 177.4 lb

## 2012-09-25 DIAGNOSIS — R599 Enlarged lymph nodes, unspecified: Secondary | ICD-10-CM

## 2012-09-25 DIAGNOSIS — C4371 Malignant melanoma of right lower limb, including hip: Secondary | ICD-10-CM

## 2012-09-25 DIAGNOSIS — R590 Localized enlarged lymph nodes: Secondary | ICD-10-CM | POA: Insufficient documentation

## 2012-09-25 DIAGNOSIS — C4370 Malignant melanoma of unspecified lower limb, including hip: Secondary | ICD-10-CM

## 2012-09-25 NOTE — Progress Notes (Signed)
Patient ID: Craig Martin, male   DOB: Jul 29, 1926, 79 y.o.   MRN: [id number]  Chief Complaint  Patient presents with  . Melanoma    HPI Craig Martin is a 79 y.o. male.  Chief complaint: Malignant melanoma lateral right foot HPI Abscesses see this patient in consultation by Dr. Kelly Splinter regarding a melanoma on his right foot. Dr. Kelly Splinter also noted palpable lymphadenopathy in the right groin.The patient initially noticed a small wound develop on his lateral right foot. This did not heal. He underwent biopsy which demonstrated malignant melanoma, 3.3 mm in thickness with positive margins. He has also recently noticed a lump in his right groin. He complains of some mild itching at the foot wound site but no other complaints. Past Medical History  Diagnosis Date  . Glaucoma   . Bradycardia   . Diabetes mellitus   . Coronary artery disease   . Hyperlipidemia   . Hypertension   . Shortness of breath     WITH EXERTION  . Stroke     "I had a stroke many yrs ago" -no residual problems  . BPH (benign prostatic hyperplasia)   . Difficulty voiding   . Dysrhythmia     AFIB  - TAKES PRADAXA FOLLOWED BY DR. Allyson Sabal  . Critical lower limb ischemia     Past Surgical History  Procedure Laterality Date  . Stented coronary artery  2004  . Transurethral resection of prostate  01/24/2012    Procedure: TRANSURETHRAL RESECTION OF THE PROSTATE WITH GYRUS INSTRUMENTS;  Surgeon: Sebastian Ache, MD;  Location: [facility];  Service: Urology;  Laterality: N/A;  . Cardiac catheterization  09/19/2002    moderate LAD and RCA and normal LV function  . Nm myocar perf wall motion  05/24/2011    showed inferior scar w/o ischemia  . Doppler echocardiography  05/24/2011    EF >55%  . Doppler echocardiography  09/28/2009    EF >55% lvh w/ left atrial enlargement,diastolic dysfunctio,elevated lv filling pressure  . Nm myocar perf wall motion  09/09/2009    mild ischemia mid anterior,apical anterior and basal inferolateral   . Nm myocar perf wall motion  12/04/2002  . Nm myocar perf wall motion  05/21/2002  . Cardiac catheterization  07/03/2002    No family history on file.  Social History History  Substance Use Topics  . Smoking status: Former Smoker -- 1.50 packs/day    Types: Cigarettes    Quit date: 01/16/1986  . Smokeless tobacco: Former Neurosurgeon  . Alcohol Use: No    No Known Allergies  Current Outpatient Prescriptions  Medication Sig Dispense Refill  . aspirin EC 81 MG tablet Take 81 mg by mouth daily.      . bimatoprost (LUMIGAN) 0.01 % SOLN Place 1 drop into both eyes at bedtime.      . dabigatran (PRADAXA) 75 MG CAPS Take by mouth every 12 (twelve) hours.      . furosemide (LASIX) 20 MG tablet Take 20 mg by mouth daily.      Marland Kitchen linagliptin (TRADJENTA) 5 MG TABS tablet Take 5 mg by mouth daily.      Marland Kitchen losartan (COZAAR) 25 MG tablet Take 25 mg by mouth daily.      . timolol (BETIMOL) 0.25 % ophthalmic solution Place 1 drop into both eyes 2 (two) times daily.       No current facility-administered medications for this visit.    Review of Systems Review of Systems  Constitutional: Negative for fever, chills and  unexpected weight change.  HENT: Negative for hearing loss, congestion, sore throat, trouble swallowing and voice change.   Eyes: Negative for visual disturbance.  Respiratory: Negative for cough and wheezing.   Cardiovascular: Negative for chest pain, palpitations and leg swelling.  Gastrointestinal: Negative for nausea, vomiting, abdominal pain, diarrhea, constipation, blood in stool, abdominal distention, anal bleeding and rectal pain.  Genitourinary: Negative for hematuria and difficulty urinating.  Musculoskeletal: Negative for arthralgias.  Skin: Positive for wound. Negative for rash.       See history of present illness  Neurological: Negative for seizures, syncope, weakness and headaches.  Hematological: Negative for adenopathy. Does not bruise/bleed easily.   Psychiatric/Behavioral: Negative for confusion.    Blood pressure 170/82, pulse 60, temperature 97.6 F (36.4 C), temperature source Oral, resp. rate 12, height 5\' 9"  (1.753 m), weight 177 lb 6 oz (80.457 kg).  Physical Exam Physical Exam  Constitutional: He appears well-developed. No distress.  HENT:  Head: Normocephalic.  Mouth/Throat: No oropharyngeal exudate.  Eyes: EOM are normal. Pupils are equal, round, and reactive to light.  Neck: Normal range of motion. No tracheal deviation present.  Cardiovascular: Normal rate and normal heart sounds.   Pulmonary/Chest: Effort normal and breath sounds normal. No stridor. No respiratory distress. He has no wheezes. He has no rales.  Abdominal: Soft. He exhibits no distension. There is no tenderness. There is no rebound and no guarding.  Musculoskeletal:  Please see photo in epic. Ulcerated lesion lateral right foot extending from side down to the plantar surface, some crusting is present, no evidence of infection, 2.5 x 4 cm  Neurological: He is alert. He exhibits normal muscle tone.  Skin:  See above  Lymph exam: Palpable lymphadenopathy in right groin 2 x 3 cm, no lifting or lymphadenopathy, no bilateral cervical, axillary or supraclavicular lymphadenopathy.  Data Reviewed Pathology report  Assessment    Malignant melanoma right lateral foot with suspiicous right inguinal lymphadenopathy    Plan    I have offered her excision melanoma right foot with right inguinal lymph node biopsy. This will be under general anesthesia. It'll be done in conjunction with Dr. Kelly Splinter who will address wound closure issues. We will need to hold his aspirin and provide access for 3 days prior to surgery. We will Obtained cardiac clearance from Dr. Allyson Sabal regarding this.Once we obtain a clearance, we will plan to schedule in conjunction with Dr. Kelly Splinter. Patient is agreeable.       Craig Martin E 09/25/2012, 11:50 AM

## 2012-09-30 ENCOUNTER — Encounter: Payer: Self-pay | Admitting: Cardiovascular Disease

## 2012-09-30 ENCOUNTER — Ambulatory Visit (INDEPENDENT_AMBULATORY_CARE_PROVIDER_SITE_OTHER): Payer: Medicare Other | Admitting: Cardiovascular Disease

## 2012-09-30 VITALS — BP 122/80 | HR 64 | Ht 69.0 in | Wt 178.0 lb

## 2012-09-30 DIAGNOSIS — I998 Other disorder of circulatory system: Secondary | ICD-10-CM

## 2012-09-30 DIAGNOSIS — I999 Unspecified disorder of circulatory system: Secondary | ICD-10-CM

## 2012-09-30 NOTE — Progress Notes (Signed)
09/30/2012 Craig Martin   09-08-1926  [id number]  Primary Physician Gwynneth Aliment, MD Primary Cardiologist: Runell Gess MD Roseanne Reno   HPI:  The patient is a very pleasant 79 year old thin-appearing single [place] American male with no children whom I last saw in the office 6 months ago. He has a history of CAD, status post circumflex stenting by Dr. Lenise Martin in 2004 with moderate LAD and RCA disease and normal LV function. His other problems include non-insulin-requiring diabetes, hypertension, and hyperlipidemia. He had a stress test done May 24, 2011, that showed inferior scar without ischemia and 2D echo revealed normal LV function. He denies chest pain or shortness of breath. He does have AFib with controlled ventricular response on Pradaxa. I saw Craig Martin in the office 12/29/11. He's developed a poorly healing ulcer on the lateral aspect of his right heel of several months and has been treated at the [facility] wound care center. He had arterial Dopplers performed in our office 05/29/12 which revealed a right ABI 1.2 but calcified noncompressible vessels with an occluded posterior tibial and proximal portion reconstituted. I'm concerned that he is ulcer may not heal based on his posterior tibial disease. His right ABI was 0.33 suggesting some post perfusion for healing. He apparently is scheduled for right heel excision of a melanoma but not Dr. Violeta Martin. I have discussed this with Dr. Janee Martin and have informed him that Craig Martin in the right TBI is 0.33 making it potentially unlikely for his wound to heal. Dr. Shella Martin also was following him at the wound care center. Should this would not heal after the surgical procedure with him and further address the possibility of tibial vessel intervention versus below-the-knee amputation.    Current Outpatient Prescriptions  Medication Sig Dispense Refill  . aspirin EC 81 MG tablet Take 81 mg by mouth  daily.      . bimatoprost (LUMIGAN) 0.01 % SOLN Place 1 drop into both eyes at bedtime.      . dabigatran (PRADAXA) 75 MG CAPS Take by mouth every 12 (twelve) hours.      . furosemide (LASIX) 20 MG tablet Take 20 mg by mouth daily.      Marland Kitchen linagliptin (TRADJENTA) 5 MG TABS tablet Take 5 mg by mouth daily.      Marland Kitchen losartan (COZAAR) 25 MG tablet Take 25 mg by mouth daily.      . timolol (TIMOPTIC) 0.5 % ophthalmic solution        No current facility-administered medications for this visit.    No Known Allergies  History   Social History  . Marital Status: Single    Spouse Name: N/A    Number of Children: N/A  . Years of Education: N/A   Occupational History  . Not on file.   Social History Main Topics  . Smoking status: Former Smoker -- 1.50 packs/day    Types: Cigarettes    Quit date: 01/16/1986  . Smokeless tobacco: Former Neurosurgeon  . Alcohol Use: No  . Drug Use: No  . Sexual Activity: Not on file   Other Topics Concern  . Not on file   Social History Narrative  . No narrative on file     Review of Systems: General: negative for chills, fever, night sweats or weight changes.  Cardiovascular: negative for chest pain, dyspnea on exertion, edema, orthopnea, palpitations, paroxysmal nocturnal dyspnea or shortness of breath Dermatological: negative for rash Respiratory: negative for cough or wheezing Urologic: negative  for hematuria Abdominal: negative for nausea, vomiting, diarrhea, bright red blood per rectum, melena, or hematemesis Neurologic: negative for visual changes, syncope, or dizziness All other systems reviewed and are otherwise negative except as noted above.    Blood pressure 122/80, pulse 64, height 5\' 9"  (1.753 m), weight 178 lb (80.74 kg).  General appearance: alert and no distress Neck: no adenopathy, no carotid bruit, no JVD, supple, symmetrical, trachea midline and thyroid not enlarged, symmetric, no tenderness/mass/nodules Lungs: clear to auscultation  bilaterally Heart: regular rate and rhythm, S1, S2 normal, no murmur, click, rub or gallop Extremities: extremities normal, atraumatic, no cyanosis or edema  EKG not performed today  ASSESSMENT AND PLAN:   Critical lower limb ischemia I just saw Craig Martin in the office on 08/19/12. He has what appears to be a melanoma on his right heel. His right ABI is 1.2 but his vessels are noncompressible calcified in his right TBI is 0.33. He really does not complain of claudication but I'm concerned that he may be a poor operative candidate with articulation yesterday on his right foot. I will discuss this with Dr. Violeta Martin.      Runell Gess MD FACP,FACC,FAHA, [facility] 09/30/2012 2:54 PM

## 2012-09-30 NOTE — Assessment & Plan Note (Signed)
I just saw Craig Martin in the office on 08/19/12. He has what appears to be a melanoma on his right heel. His right ABI is 1.2 but his vessels are noncompressible calcified in his right TBI is 0.33. He really does not complain of claudication but I'm concerned that he may be a poor operative candidate with articulation yesterday on his right foot. I will discuss this with Dr. Violeta Gelinas.

## 2012-09-30 NOTE — Patient Instructions (Addendum)
Your physician wants you to follow-up in: 6 months with an extender and 1 year with Dr Allyson Sabal. You will receive a reminder letter in the mail two months in advance. If you don't receive a letter, please call our office to schedule the follow-up appointment.   Dr Allyson Sabal spoke with Dr Janee Morn about the surgery on your foot. Dr Janee Morn is going to proceed with the surgery.  The medication that you take by the name of Pradaxa will need to be stopped prior to the procedure and restarted after the procedure.  Our pharmacist, Belenda Cruise, will tell you when to stop the medication and when to restart the medication.  Please call her and tell her the date of your procedure so she can give you all the instructions.

## 2012-10-07 ENCOUNTER — Encounter (HOSPITAL_BASED_OUTPATIENT_CLINIC_OR_DEPARTMENT_OTHER): Payer: Medicare Other | Attending: Plastic Surgery

## 2012-10-07 DIAGNOSIS — I2510 Atherosclerotic heart disease of native coronary artery without angina pectoris: Secondary | ICD-10-CM | POA: Insufficient documentation

## 2012-10-07 DIAGNOSIS — I10 Essential (primary) hypertension: Secondary | ICD-10-CM | POA: Insufficient documentation

## 2012-10-07 DIAGNOSIS — C439 Malignant melanoma of skin, unspecified: Secondary | ICD-10-CM | POA: Insufficient documentation

## 2012-10-07 DIAGNOSIS — E119 Type 2 diabetes mellitus without complications: Secondary | ICD-10-CM | POA: Insufficient documentation

## 2012-10-10 ENCOUNTER — Telehealth: Payer: Self-pay | Admitting: Cardiovascular Disease

## 2012-10-10 ENCOUNTER — Telehealth (INDEPENDENT_AMBULATORY_CARE_PROVIDER_SITE_OTHER): Payer: Self-pay

## 2012-10-10 NOTE — Telephone Encounter (Signed)
Spoke with sara, surgery set for 10/20.  Will advise pt to hold

## 2012-10-10 NOTE — Telephone Encounter (Signed)
The pharmacist Belenda Cruise at [facility] called regarding the pt.  She states his creatinine clearance is low so she plans to hold the Pradaxa 4 days preop.

## 2012-10-10 NOTE — Telephone Encounter (Signed)
I left a message with the nurse Jasmine December to inform them the patient's surgery is 10/20.  He is on Pradaxa and the pharmacist is supposed to adjust stopping that with the pt.

## 2012-10-10 NOTE — Telephone Encounter (Signed)
[name] stated that orders were written to contact Dr Allyson Sabal 's office concerning patients upcoming surgery with Dr Janee Morn. The patient is on Pradaxa. They already have clearance.  Will send to [place], Craig Martin

## 2012-10-10 NOTE — Telephone Encounter (Signed)
Cont- hold pradaxa x 4 days, ClCr is 36.  Will call pt as well with information

## 2012-10-11 ENCOUNTER — Encounter (HOSPITAL_COMMUNITY): Payer: Self-pay | Admitting: Pharmacy Technician

## 2012-10-14 ENCOUNTER — Encounter (HOSPITAL_COMMUNITY)
Admission: RE | Admit: 2012-10-14 | Discharge: 2012-10-14 | Disposition: A | Discharge status: Home / Self Care | Payer: Medicare Other | Source: Ambulatory Visit | Attending: General Surgery | Admitting: General Surgery

## 2012-10-14 ENCOUNTER — Other Ambulatory Visit: Payer: Self-pay | Admitting: General Surgery

## 2012-10-14 ENCOUNTER — Encounter (HOSPITAL_COMMUNITY): Payer: Self-pay

## 2012-10-14 DIAGNOSIS — Z01812 Encounter for preprocedural laboratory examination: Secondary | ICD-10-CM | POA: Insufficient documentation

## 2012-10-14 HISTORY — DX: Unspecified osteoarthritis, unspecified site: M19.90

## 2012-10-14 HISTORY — DX: Malignant melanoma of skin, unspecified: C43.9

## 2012-10-14 LAB — CBC
MCV: 89.3 fL (ref 78.0–100.0)
Platelets: 194 10*3/uL (ref 150–400)
RBC: 3.63 MIL/uL — ABNORMAL LOW (ref 4.22–5.81)
WBC: 5.2 10*3/uL (ref 4.0–10.5)

## 2012-10-14 LAB — BASIC METABOLIC PANEL
CO2: 25 mEq/L (ref 19–32)
Calcium: 8.8 mg/dL (ref 8.4–10.5)
Creatinine, Ser: 1.73 mg/dL — ABNORMAL HIGH (ref 0.50–1.35)
GFR calc Af Amer: 40 mL/min — ABNORMAL LOW (ref >90)
Potassium: 3.5 mEq/L (ref 3.5–5.1)
Sodium: 137 mEq/L (ref 135–145)

## 2012-10-14 NOTE — Progress Notes (Signed)
Patient informed to take last dose of Pradaxa and ASA on October 14. Wrote instructions down on preop instruction sheet. Patient repeated instructions back.

## 2012-10-14 NOTE — Pre-Procedure Instructions (Signed)
Craig Martin  10/14/2012   Your procedure is scheduled on:   Monday  10/21/12   Report to [facility]  2 * 3 at 1050 AM.  Call this number if you have problems the morning of surgery: [phone]   Remember:   Do not eat food or drink liquids after midnight.   Take these medicines the morning of surgery with A SIP OF WATER:  Eye drops   Do not wear jewelry, make-up or nail polish.  Do not wear lotions, powders, or perfumes. You may wear deodorant.  Do not shave 48 hours prior to surgery. Men may shave face and neck.  Do not bring valuables to the hospital.  [facility] is not responsible                  for any belongings or valuables.               Contacts, dentures or bridgework may not be worn into surgery.  Leave suitcase in the car. After surgery it may be brought to your room.  For patients admitted to the hospital, discharge time is determined by your                treatment team.               Patients discharged the day of surgery will not be allowed to drive  home.  Name and phone number of your driver:  Special Instructions: Shower using CHG 2 nights before surgery and the night before surgery.  If you shower the day of surgery use CHG.  Use special wash - you have one bottle of CHG for all showers.  You should use approximately 1/3 of the bottle for each shower.   Please read over the following fact sheets that you were given: Pain Booklet, Coughing and Deep Breathing and Surgical Site Infection Prevention

## 2012-10-14 NOTE — Progress Notes (Signed)
10/14/12 1130  OBSTRUCTIVE SLEEP APNEA  Have you ever been diagnosed with sleep apnea through a sleep study? No  Do you snore loudly (loud enough to be heard through closed doors)?  1  Do you often feel tired, fatigued, or sleepy during the daytime? 1  Has anyone observed you stop breathing during your sleep? 0  Do you have, or are you being treated for high blood pressure? 1  BMI more than 35 kg/m2? 0  Age over 77 years old? 1  Neck circumference greater than 40 cm/18 inches? 0  Gender: 1  Obstructive Sleep Apnea Score 5  Score 4 or greater  Results sent to PCP

## 2012-10-15 NOTE — Progress Notes (Signed)
Anesthesia Chart Review:  Patient is a 79 year old male scheduled for wide excision melanoma, right foot, right inguinal LN biopsy on 10/21/12 by Dr. Janee Morn.  History includes CAD/MIs/p CX stent '04, DM2, HTN, HLD, afib, PAD, glaucoma, former smoker, CVA, BPH s/p TURP. OSA screening score was 5. PCP is Dr. Dorothyann Peng.   Cardiologist is Dr. Nanetta Batty who is aware of plans for surgery.  There is some concern that his right foot wound may not heal (low TBI) which he discussed with Dr. Janee Morn. Plastics Dr. Kelly Splinter is already following patient at the wound center. He may ultimately require intervention to his tibial vessels. Patient is to hold his Pradaxa four days prior to surgery.  EKG on 12/29/11 showed afib with occasional PVC's, LAD. HR was 78 bpm at PAT yesterday.  Nuclear stress test on 05/24/11 showed moderate perfusion defect due to infarct/scar with minimal perinfarct ischemia seen in the basal inferior, mid inferior, apical inferior, basal inferolateral, mid inferolateral, and apical lateral regions.  No scintigraphic evidence of significant inducible myocardial ischemia.  Findings were felt consistent with prior infarction, and ultimately felt to be a low risk study.  Echo on 05/24/11 showed mild hypertrophic Lv with normal systolic function, EF 55-60%, diastolic dysfunction, probably moderate.  Elevated mean LA pressure.  Marked biatrial dilatation, left > right.  Mild MR. Mild to moderate TR.  Mildly elevated systolic pulmonary artery pressure.  Mildly dilated aortic root. No pericardial effusion.  CXR on 01/17/12 showed stable cardiomegaly, no active lung disease.  Preoperative labs noted.  BUN/Cr 24/1.73 (stable since 01/17/12).  Glucose 149.  H/H 10.9/32.4.  If no acute changes then I anticipate that he can proceed as planned.  Velna Ochs [facility]/Anesthesiology Phone [phone] 10/15/2012 12:14 PM

## 2012-10-20 MED ORDER — CEFAZOLIN SODIUM-DEXTROSE 2-3 GM-% IV SOLR
2.0000 g | INTRAVENOUS | Status: AC
  Administered 2012-10-21: 2 g via INTRAVENOUS
  Filled 2012-10-20: qty 50

## 2012-10-20 MED ORDER — CHLORHEXIDINE GLUCONATE 4 % EX LIQD: 1.0000 "application " | Freq: Once | CUTANEOUS | Status: DC

## 2012-10-21 ENCOUNTER — Encounter (HOSPITAL_COMMUNITY): Payer: Self-pay | Admitting: Plastic Surgery

## 2012-10-21 ENCOUNTER — Observation Stay (HOSPITAL_COMMUNITY)
Admission: RE | Admit: 2012-10-21 | Discharge: 2012-10-24 | Disposition: A | Discharge status: Skilled Nursing Facility | Payer: Medicare Other | Source: Ambulatory Visit | Attending: General Surgery | Admitting: General Surgery

## 2012-10-21 ENCOUNTER — Encounter (HOSPITAL_COMMUNITY): Payer: Medicare Other | Admitting: Vascular Surgery

## 2012-10-21 ENCOUNTER — Other Ambulatory Visit: Payer: Self-pay | Admitting: Plastic Surgery

## 2012-10-21 ENCOUNTER — Other Ambulatory Visit: Payer: Self-pay

## 2012-10-21 ENCOUNTER — Ambulatory Visit (HOSPITAL_COMMUNITY): Payer: Medicare Other | Admitting: Anesthesiology

## 2012-10-21 ENCOUNTER — Encounter (HOSPITAL_COMMUNITY)
Admission: RE | Disposition: A | Discharge status: Skilled Nursing Facility | Payer: Self-pay | Source: Ambulatory Visit | Attending: General Surgery

## 2012-10-21 DIAGNOSIS — Y838 Other surgical procedures as the cause of abnormal reaction of the patient, or of later complication, without mention of misadventure at the time of the procedure: Secondary | ICD-10-CM | POA: Insufficient documentation

## 2012-10-21 DIAGNOSIS — Z79899 Other long term (current) drug therapy: Secondary | ICD-10-CM | POA: Insufficient documentation

## 2012-10-21 DIAGNOSIS — I4729 Other ventricular tachycardia: Secondary | ICD-10-CM | POA: Diagnosis not present

## 2012-10-21 DIAGNOSIS — I482 Chronic atrial fibrillation, unspecified: Secondary | ICD-10-CM

## 2012-10-21 DIAGNOSIS — I472 Ventricular tachycardia, unspecified: Secondary | ICD-10-CM | POA: Insufficient documentation

## 2012-10-21 DIAGNOSIS — I4891 Unspecified atrial fibrillation: Secondary | ICD-10-CM | POA: Insufficient documentation

## 2012-10-21 DIAGNOSIS — C774 Secondary and unspecified malignant neoplasm of inguinal and lower limb lymph nodes: Secondary | ICD-10-CM | POA: Insufficient documentation

## 2012-10-21 DIAGNOSIS — N138 Other obstructive and reflux uropathy: Secondary | ICD-10-CM | POA: Insufficient documentation

## 2012-10-21 DIAGNOSIS — Y9210 Unspecified residential institution as the place of occurrence of the external cause: Secondary | ICD-10-CM | POA: Insufficient documentation

## 2012-10-21 DIAGNOSIS — N9989 Other postprocedural complications and disorders of genitourinary system: Secondary | ICD-10-CM | POA: Insufficient documentation

## 2012-10-21 DIAGNOSIS — E785 Hyperlipidemia, unspecified: Secondary | ICD-10-CM | POA: Diagnosis present

## 2012-10-21 DIAGNOSIS — I999 Unspecified disorder of circulatory system: Secondary | ICD-10-CM | POA: Insufficient documentation

## 2012-10-21 DIAGNOSIS — I2510 Atherosclerotic heart disease of native coronary artery without angina pectoris: Secondary | ICD-10-CM | POA: Diagnosis present

## 2012-10-21 DIAGNOSIS — N320 Bladder-neck obstruction: Secondary | ICD-10-CM | POA: Insufficient documentation

## 2012-10-21 DIAGNOSIS — C439 Malignant melanoma of skin, unspecified: Secondary | ICD-10-CM

## 2012-10-21 DIAGNOSIS — I70229 Atherosclerosis of native arteries of extremities with rest pain, unspecified extremity: Secondary | ICD-10-CM | POA: Diagnosis present

## 2012-10-21 DIAGNOSIS — H409 Unspecified glaucoma: Secondary | ICD-10-CM | POA: Insufficient documentation

## 2012-10-21 DIAGNOSIS — R339 Retention of urine, unspecified: Secondary | ICD-10-CM | POA: Insufficient documentation

## 2012-10-21 DIAGNOSIS — N401 Enlarged prostate with lower urinary tract symptoms: Secondary | ICD-10-CM | POA: Insufficient documentation

## 2012-10-21 DIAGNOSIS — E119 Type 2 diabetes mellitus without complications: Secondary | ICD-10-CM | POA: Diagnosis present

## 2012-10-21 DIAGNOSIS — R32 Unspecified urinary incontinence: Secondary | ICD-10-CM | POA: Insufficient documentation

## 2012-10-21 DIAGNOSIS — I10 Essential (primary) hypertension: Secondary | ICD-10-CM | POA: Diagnosis present

## 2012-10-21 DIAGNOSIS — I998 Other disorder of circulatory system: Secondary | ICD-10-CM | POA: Diagnosis present

## 2012-10-21 DIAGNOSIS — E876 Hypokalemia: Secondary | ICD-10-CM | POA: Insufficient documentation

## 2012-10-21 DIAGNOSIS — IMO0002 Reserved for concepts with insufficient information to code with codable children: Secondary | ICD-10-CM | POA: Insufficient documentation

## 2012-10-21 DIAGNOSIS — C4370 Malignant melanoma of unspecified lower limb, including hip: Secondary | ICD-10-CM

## 2012-10-21 DIAGNOSIS — C4371 Malignant melanoma of right lower limb, including hip: Secondary | ICD-10-CM | POA: Diagnosis present

## 2012-10-21 DIAGNOSIS — I4949 Other premature depolarization: Secondary | ICD-10-CM | POA: Insufficient documentation

## 2012-10-21 HISTORY — PX: INGUINAL LYMPH NODE BIOPSY: SHX5865

## 2012-10-21 HISTORY — PX: APPLICATION OF A-CELL OF EXTREMITY: SHX6303

## 2012-10-21 HISTORY — PX: MELANOMA EXCISION: SHX5266

## 2012-10-21 LAB — BASIC METABOLIC PANEL
CO2: 22 mEq/L (ref 19–32)
Chloride: 101 mEq/L (ref 96–112)
Creatinine, Ser: 1.61 mg/dL — ABNORMAL HIGH (ref 0.50–1.35)
Potassium: 3.2 mEq/L — ABNORMAL LOW (ref 3.5–5.1)

## 2012-10-21 LAB — GLUCOSE, CAPILLARY: Glucose-Capillary: 111 mg/dL — ABNORMAL HIGH (ref 70–99)

## 2012-10-21 LAB — MAGNESIUM: Magnesium: 1.5 mg/dL (ref 1.5–2.5)

## 2012-10-21 SURGERY — EXCISION, MELANOMA
Anesthesia: General | Site: Groin | Laterality: Right | Wound class: Dirty or Infected

## 2012-10-21 MED ORDER — LACTATED RINGERS IV SOLN
INTRAVENOUS | Status: DC | PRN
  Administered 2012-10-21: 12:00:00 via INTRAVENOUS

## 2012-10-21 MED ORDER — ONDANSETRON HCL 4 MG/2ML IJ SOLN: 4.0000 mg | Freq: Four times a day (QID) | INTRAMUSCULAR | Status: DC | PRN

## 2012-10-21 MED ORDER — POTASSIUM CHLORIDE CRYS ER 20 MEQ PO TBCR
40.0000 meq | EXTENDED_RELEASE_TABLET | Freq: Two times a day (BID) | ORAL | Status: DC
  Administered 2012-10-21 – 2012-10-24 (×6): 40 meq via ORAL
  Filled 2012-10-21 (×10): qty 2

## 2012-10-21 MED ORDER — TIMOLOL MALEATE 0.5 % OP SOLN
1.0000 [drp] | Freq: Every day | OPHTHALMIC | Status: DC
  Administered 2012-10-22 – 2012-10-24 (×3): 1 [drp] via OPHTHALMIC
  Filled 2012-10-21 (×2): qty 5

## 2012-10-21 MED ORDER — CHLORHEXIDINE GLUCONATE CLOTH 2 % EX PADS
6.0000 | MEDICATED_PAD | Freq: Every day | CUTANEOUS | Status: DC
  Administered 2012-10-22 – 2012-10-24 (×3): 6 via TOPICAL

## 2012-10-21 MED ORDER — LOSARTAN POTASSIUM 25 MG PO TABS
25.0000 mg | ORAL_TABLET | Freq: Every day | ORAL | Status: DC
  Administered 2012-10-22 – 2012-10-24 (×3): 25 mg via ORAL
  Filled 2012-10-21 (×3): qty 1

## 2012-10-21 MED ORDER — MAGNESIUM SULFATE 40 MG/ML IJ SOLN
2.0000 g | Freq: Once | INTRAMUSCULAR | Status: AC
  Administered 2012-10-21: 2 g via INTRAVENOUS
  Filled 2012-10-21: qty 50

## 2012-10-21 MED ORDER — KCL IN DEXTROSE-NACL 20-5-0.9 MEQ/L-%-% IV SOLN
INTRAVENOUS | Status: DC
Start: 2012-10-21 — End: 2012-10-23
  Administered 2012-10-21: 20 mL/h via INTRAVENOUS
  Filled 2012-10-21: qty 1000

## 2012-10-21 MED ORDER — HEPARIN SODIUM (PORCINE) 5000 UNIT/ML IJ SOLN
5000.0000 [IU] | Freq: Three times a day (TID) | INTRAMUSCULAR | Status: DC
  Administered 2012-10-22 – 2012-10-24 (×7): 5000 [IU] via SUBCUTANEOUS
  Filled 2012-10-21 (×13): qty 1

## 2012-10-21 MED ORDER — OXYCODONE-ACETAMINOPHEN 5-325 MG PO TABS
1.0000 | ORAL_TABLET | ORAL | Status: DC | PRN
  Administered 2012-10-21: 1 via ORAL
  Administered 2012-10-21: 2 via ORAL
  Filled 2012-10-21: qty 2

## 2012-10-21 MED ORDER — GLYCOPYRROLATE 0.2 MG/ML IJ SOLN
INTRAMUSCULAR | Status: DC | PRN
  Administered 2012-10-21: 0.2 mg via INTRAVENOUS

## 2012-10-21 MED ORDER — LACTATED RINGERS IV SOLN
INTRAVENOUS | Status: DC
  Administered 2012-10-21: 12:00:00 via INTRAVENOUS

## 2012-10-21 MED ORDER — LINAGLIPTIN 5 MG PO TABS
5.0000 mg | ORAL_TABLET | Freq: Every day | ORAL | Status: DC
  Administered 2012-10-22 – 2012-10-24 (×3): 5 mg via ORAL
  Filled 2012-10-21 (×3): qty 1

## 2012-10-21 MED ORDER — PHENYLEPHRINE HCL 10 MG/ML IJ SOLN
INTRAMUSCULAR | Status: DC | PRN
  Administered 2012-10-21 (×3): 40 ug via INTRAVENOUS

## 2012-10-21 MED ORDER — EPHEDRINE SULFATE 50 MG/ML IJ SOLN
INTRAMUSCULAR | Status: DC | PRN
  Administered 2012-10-21: 10 mg via INTRAVENOUS

## 2012-10-21 MED ORDER — MORPHINE SULFATE 2 MG/ML IJ SOLN
2.0000 mg | INTRAMUSCULAR | Status: DC | PRN
  Administered 2012-10-21 (×2): 2 mg via INTRAVENOUS
  Filled 2012-10-21 (×2): qty 1

## 2012-10-21 MED ORDER — HYDRALAZINE HCL 20 MG/ML IJ SOLN
10.0000 mg | Freq: Once | INTRAMUSCULAR | Status: AC
  Administered 2012-10-21: 10 mg via INTRAVENOUS

## 2012-10-21 MED ORDER — LIDOCAINE HCL (CARDIAC) 20 MG/ML IV SOLN
INTRAVENOUS | Status: DC | PRN
  Administered 2012-10-21: 80 mg via INTRAVENOUS

## 2012-10-21 MED ORDER — ONDANSETRON HCL 4 MG PO TABS: 4.0000 mg | ORAL_TABLET | Freq: Four times a day (QID) | ORAL | Status: DC | PRN

## 2012-10-21 MED ORDER — ONDANSETRON HCL 4 MG/2ML IJ SOLN
INTRAMUSCULAR | Status: DC | PRN
  Administered 2012-10-21: 4 mg via INTRAVENOUS

## 2012-10-21 MED ORDER — BUPIVACAINE-EPINEPHRINE 0.5% -1:200000 IJ SOLN
INTRAMUSCULAR | Status: DC | PRN
  Administered 2012-10-21: 7 mL

## 2012-10-21 MED ORDER — FUROSEMIDE 20 MG PO TABS
20.0000 mg | ORAL_TABLET | Freq: Every day | ORAL | Status: DC
  Administered 2012-10-22 – 2012-10-24 (×3): 20 mg via ORAL
  Filled 2012-10-21 (×3): qty 1

## 2012-10-21 MED ORDER — HYDRALAZINE HCL 20 MG/ML IJ SOLN
INTRAMUSCULAR | Status: AC
  Filled 2012-10-21: qty 1

## 2012-10-21 MED ORDER — BUPIVACAINE-EPINEPHRINE (PF) 0.5% -1:200000 IJ SOLN
INTRAMUSCULAR | Status: AC
  Filled 2012-10-21: qty 10

## 2012-10-21 MED ORDER — 0.9 % SODIUM CHLORIDE (POUR BTL) OPTIME
TOPICAL | Status: DC | PRN
  Administered 2012-10-21 (×2): 1000 mL

## 2012-10-21 MED ORDER — PROPOFOL 10 MG/ML IV BOLUS
INTRAVENOUS | Status: DC | PRN
  Administered 2012-10-21: 20 mg via INTRAVENOUS
  Administered 2012-10-21: 200 mg via INTRAVENOUS
  Administered 2012-10-21: 10 mg via INTRAVENOUS

## 2012-10-21 MED ORDER — OXYCODONE-ACETAMINOPHEN 5-325 MG PO TABS
ORAL_TABLET | ORAL | Status: AC
  Filled 2012-10-21: qty 1

## 2012-10-21 MED ORDER — SUCCINYLCHOLINE CHLORIDE 20 MG/ML IJ SOLN
INTRAMUSCULAR | Status: DC | PRN
  Administered 2012-10-21: 100 mg via INTRAVENOUS

## 2012-10-21 MED ORDER — FENTANYL CITRATE 0.05 MG/ML IJ SOLN
INTRAMUSCULAR | Status: DC | PRN
  Administered 2012-10-21: 50 ug via INTRAVENOUS
  Administered 2012-10-21: 100 ug via INTRAVENOUS
  Administered 2012-10-21: 50 ug via INTRAVENOUS

## 2012-10-21 MED ORDER — MUPIROCIN 2 % EX OINT
1.0000 "application " | TOPICAL_OINTMENT | Freq: Two times a day (BID) | CUTANEOUS | Status: DC
  Administered 2012-10-21 – 2012-10-24 (×6): 1 via NASAL
  Filled 2012-10-21 (×2): qty 22

## 2012-10-21 MED ORDER — INSULIN ASPART 100 UNIT/ML ~~LOC~~ SOLN
0.0000 [IU] | Freq: Three times a day (TID) | SUBCUTANEOUS | Status: DC
  Administered 2012-10-21 – 2012-10-22 (×2): 2 [IU] via SUBCUTANEOUS
  Administered 2012-10-22: 3 [IU] via SUBCUTANEOUS
  Administered 2012-10-22: 09:00:00 via SUBCUTANEOUS
  Administered 2012-10-23 (×3): 2 [IU] via SUBCUTANEOUS
  Administered 2012-10-24: 3 [IU] via SUBCUTANEOUS

## 2012-10-21 MED ORDER — LIDOCAINE HCL 4 % MT SOLN
OROMUCOSAL | Status: DC | PRN
  Administered 2012-10-21: 4 mL via TOPICAL

## 2012-10-21 MED ORDER — SODIUM CHLORIDE 0.9 % IR SOLN
Status: DC | PRN
  Administered 2012-10-21: 14:00:00

## 2012-10-21 SURGICAL SUPPLY — 75 items
BAG DECANTER FOR FLEXI CONT (MISCELLANEOUS) ×3 IMPLANT
BANDAGE ELASTIC 4 VELCRO ST LF (GAUZE/BANDAGES/DRESSINGS) ×3 IMPLANT
BANDAGE GAUZE ELAST BULKY 4 IN (GAUZE/BANDAGES/DRESSINGS) ×6 IMPLANT
BENZOIN TINCTURE PRP APPL 2/3 (GAUZE/BANDAGES/DRESSINGS) IMPLANT
BLADE SURG 10 STRL SS (BLADE) ×3 IMPLANT
BLADE SURG 15 STRL LF DISP TIS (BLADE) ×2 IMPLANT
BLADE SURG 15 STRL SS (BLADE) ×1
BLADE SURG ROTATE 9660 (MISCELLANEOUS) ×3 IMPLANT
CANISTER SUCTION 2500CC (MISCELLANEOUS) ×3 IMPLANT
CHLORAPREP W/TINT 26ML (MISCELLANEOUS) ×3 IMPLANT
CONT SPEC 4OZ CLIKSEAL STRL BL (MISCELLANEOUS) ×21 IMPLANT
COVER SURGICAL LIGHT HANDLE (MISCELLANEOUS) ×3 IMPLANT
DECANTER SPIKE VIAL GLASS SM (MISCELLANEOUS) IMPLANT
DERMABOND ADHESIVE PROPEN (GAUZE/BANDAGES/DRESSINGS) ×1
DERMABOND ADVANCED (GAUZE/BANDAGES/DRESSINGS) ×1
DERMABOND ADVANCED .7 DNX12 (GAUZE/BANDAGES/DRESSINGS) ×2 IMPLANT
DERMABOND ADVANCED .7 DNX6 (GAUZE/BANDAGES/DRESSINGS) ×2 IMPLANT
DRAPE LAPAROTOMY T 102X78X121 (DRAPES) IMPLANT
DRAPE ORTHO SPLIT 77X108 STRL (DRAPES) ×2
DRAPE PED LAPAROTOMY (DRAPES) ×3 IMPLANT
DRAPE SURG ORHT 6 SPLT 77X108 (DRAPES) ×4 IMPLANT
DRAPE UTILITY 15X26 W/TAPE STR (DRAPE) ×6 IMPLANT
DRSG ADAPTIC 3X8 NADH LF (GAUZE/BANDAGES/DRESSINGS) ×6 IMPLANT
DRSG PAD ABDOMINAL 8X10 ST (GAUZE/BANDAGES/DRESSINGS) ×3 IMPLANT
ELECT CAUTERY BLADE 6.4 (BLADE) ×3 IMPLANT
ELECT REM PT RETURN 9FT ADLT (ELECTROSURGICAL) ×3
ELECTRODE REM PT RTRN 9FT ADLT (ELECTROSURGICAL) ×2 IMPLANT
GAUZE SPONGE 4X4 16PLY XRAY LF (GAUZE/BANDAGES/DRESSINGS) ×3 IMPLANT
GEL ULTRASOUND 20GR AQUASONIC (MISCELLANEOUS) ×15 IMPLANT
GLOVE BIO SURGEON STRL SZ 6.5 (GLOVE) ×9 IMPLANT
GLOVE BIO SURGEON STRL SZ8 (GLOVE) ×6 IMPLANT
GLOVE BIOGEL PI IND STRL 7.0 (GLOVE) ×8 IMPLANT
GLOVE BIOGEL PI IND STRL 8 (GLOVE) ×2 IMPLANT
GLOVE BIOGEL PI INDICATOR 7.0 (GLOVE) ×4
GLOVE BIOGEL PI INDICATOR 8 (GLOVE) ×1
GLOVE SURG SS PI 7.0 STRL IVOR (GLOVE) ×6 IMPLANT
GOWN STRL NON-REIN LRG LVL3 (GOWN DISPOSABLE) ×12 IMPLANT
GOWN STRL REIN XL XLG (GOWN DISPOSABLE) ×3 IMPLANT
KIT BASIN OR (CUSTOM PROCEDURE TRAY) ×3 IMPLANT
KIT ROOM TURNOVER OR (KITS) ×3 IMPLANT
MARKER SKIN DUAL TIP RULER LAB (MISCELLANEOUS) ×3 IMPLANT
MATRIX SURGICAL PSMX 10X15CM (Tissue) ×3 IMPLANT
MICROMATRIX 1000MG (Tissue) ×3 IMPLANT
NEEDLE 22X1 1/2 (OR ONLY) (NEEDLE) ×3 IMPLANT
NS IRRIG 1000ML POUR BTL (IV SOLUTION) ×3 IMPLANT
PACK SURGICAL SETUP 50X90 (CUSTOM PROCEDURE TRAY) ×3 IMPLANT
PAD ARMBOARD 7.5X6 YLW CONV (MISCELLANEOUS) ×3 IMPLANT
PENCIL BUTTON HOLSTER BLD 10FT (ELECTRODE) ×6 IMPLANT
SOLUTION PARTIC MCRMTRX 1000MG (Tissue) ×2 IMPLANT
SPECIMEN JAR LARGE (MISCELLANEOUS) ×3 IMPLANT
SPONGE GAUZE 4X4 12PLY (GAUZE/BANDAGES/DRESSINGS) ×6 IMPLANT
SPONGE LAP 18X18 X RAY DECT (DISPOSABLE) ×6 IMPLANT
STAPLER VISISTAT 35W (STAPLE) ×3 IMPLANT
STOCKINETTE IMPERVIOUS LG (DRAPES) ×3 IMPLANT
STRIP CLOSURE SKIN 1/2X4 (GAUZE/BANDAGES/DRESSINGS) IMPLANT
SUT ETHILON 3 0 FSL (SUTURE) IMPLANT
SUT ETHILON 4 0 PS 2 18 (SUTURE) IMPLANT
SUT MNCRL AB 4-0 PS2 18 (SUTURE) ×3 IMPLANT
SUT MON AB 4-0 PC3 18 (SUTURE) IMPLANT
SUT SILK 2 0 SH (SUTURE) IMPLANT
SUT SILK 3 0 SH 30 (SUTURE) ×6 IMPLANT
SUT VIC AB 2-0 SH 27 (SUTURE)
SUT VIC AB 2-0 SH 27X BRD (SUTURE) IMPLANT
SUT VIC AB 3-0 SH 27 (SUTURE) ×1
SUT VIC AB 3-0 SH 27X BRD (SUTURE) ×2 IMPLANT
SUT VIC AB 3-0 SH 27XBRD (SUTURE) IMPLANT
SUT VIC AB 5-0 PS2 18 (SUTURE) ×6 IMPLANT
SYR BULB 3OZ (MISCELLANEOUS) ×3 IMPLANT
SYR BULB IRRIGATION 50ML (SYRINGE) ×3 IMPLANT
SYR CONTROL 10ML LL (SYRINGE) ×3 IMPLANT
TOWEL OR 17X24 6PK STRL BLUE (TOWEL DISPOSABLE) ×3 IMPLANT
TOWEL OR 17X26 10 PK STRL BLUE (TOWEL DISPOSABLE) ×3 IMPLANT
TUBE CONNECTING 12X1/4 (SUCTIONS) ×9 IMPLANT
UNDERPAD 30X30 INCONTINENT (UNDERPADS AND DIAPERS) ×3 IMPLANT
YANKAUER SUCT BULB TIP NO VENT (SUCTIONS) ×6 IMPLANT

## 2012-10-21 NOTE — Op Note (Signed)
Operative Note   DATE OF OPERATION: 10/21/2012  SURGICAL DIVISION: Plastic Surgery  PREOPERATIVE DIAGNOSES:  Melanoma right foot  POSTOPERATIVE DIAGNOSES:  same  PROCEDURE:  Placement of Acell to right foot (10 x 15 cm) and powder (1 gm)  SURGEON: Wayland Denis, DO  ASSISTANT: Shawn Rayburn, PA  ANESTHESIA:  General.   COMPLICATIONS: None.   INDICATIONS FOR PROCEDURE:  The patient, Craig Martin, is a 79 y.o. male born on [birth year], is here for treatment of right foot melanoma   CONSENT:  Informed consent was obtained directly from the patient. Risks, benefits and alternatives were fully discussed. Specific risks including but not limited to bleeding, infection, hematoma, seroma, scarring, pain, implant infection, implant extrusion, capsular contracture, asymmetry, wound healing problems, and need for further surgery were all discussed. The patient did have an ample opportunity to have questions answered to satisfaction.   DESCRIPTION OF PROCEDURE:  The patient was taken to the operating room. SCDs were placed and IV antibiotics were given. The patient's operative site was prepped and draped in a sterile fashion. A time out was performed and all information was confirmed to be correct.  After the melanoma was excised by general surgery the patient was rendered to the plastic surgery team.  The area was irrigated with antibiotic solution.  Hemostasis was achieved with electrocautery.  The Acell powder was placed on the sheet and the sheet was applied to the wound.  It was secured with 5-0 Vicryl.  Adaptic was placed with surgical lube and 4 x 4 gauze.  The foot was wrapped with Kerlex and an ace wrap.  A boot was placed in PACU.  He tolerated the procedure well.  The patient was allowed to wake from anesthesia, extubated and taken to the recovery room in satisfactory condition.

## 2012-10-21 NOTE — H&P (Signed)
Craig Martin is an 79 y.o. male.   Chief Complaint: right foot melanoma HPI: The patient is a 79 yrs old male here for a history and physical for surgical management of his melanoma.  He was seen in the wound care center for an ulcer on the bottom of his right foot.  Initially it looked like a chronic ulcer but did not show signs of healing after local care.  A biopsy showed melanoma.  Dr. Janee Morn was consulted for excision and we will aid in the repair which will be staged due to the need for negative margin.  Past Medical History  Diagnosis Date  . Glaucoma   . Bradycardia   . Diabetes mellitus   . Coronary artery disease   . Hyperlipidemia   . Hypertension   . Shortness of breath     WITH EXERTION  . Stroke     "I had a stroke many yrs ago" -no residual problems  . BPH (benign prostatic hyperplasia)   . Difficulty voiding   . Dysrhythmia     AFIB  - TAKES PRADAXA FOLLOWED BY DR. Allyson Sabal  . Critical lower limb ischemia   . Melanoma   . Arthritis     Past Surgical History  Procedure Laterality Date  . Stented coronary artery  2004  . Transurethral resection of prostate  01/24/2012    Procedure: TRANSURETHRAL RESECTION OF THE PROSTATE WITH GYRUS INSTRUMENTS;  Surgeon: Sebastian Ache, MD;  Location: [facility];  Service: Urology;  Laterality: N/A;  . Cardiac catheterization  09/19/2002    moderate LAD and RCA and normal LV function  . Nm myocar perf wall motion  05/24/2011    showed inferior scar w/o ischemia  . Doppler echocardiography  05/24/2011    EF >55%  . Doppler echocardiography  09/28/2009    EF >55% lvh w/ left atrial enlargement,diastolic dysfunctio,elevated lv filling pressure  . Nm myocar perf wall motion  09/09/2009    mild ischemia mid anterior,apical anterior and basal inferolateral  . Nm myocar perf wall motion  12/04/2002  . Nm myocar perf wall motion  05/21/2002  . Cardiac catheterization  07/03/2002  . Cataract extraction      No family history on  file. Social History:  reports that he quit smoking about 26 years ago. His smoking use included Cigarettes. He smoked 1.50 packs per day. He has quit using smokeless tobacco. He reports that he does not drink alcohol or use illicit drugs.  Allergies: No Known Allergies  No prescriptions prior to admission    No results found for this or any previous visit (from the past 48 hour(s)). No results found.  Review of Systems  Constitutional: Negative.   HENT: Negative.   Eyes: Negative.   Respiratory: Negative.   Cardiovascular: Negative.   Gastrointestinal: Negative.   Genitourinary: Negative.   Musculoskeletal: Negative.   Skin:       Melanoma right foot  Neurological: Negative.   Psychiatric/Behavioral: Negative.     There were no vitals taken for this visit. Physical Exam  Constitutional: He appears well-developed and well-nourished.  HENT:  Head: Normocephalic and atraumatic.  Eyes: Conjunctivae and EOM are normal. Pupils are equal, round, and reactive to light.  Neck: Normal range of motion.  Cardiovascular: Normal rate.   Respiratory: Effort normal.  GI: Soft.  Musculoskeletal: Normal range of motion.  Psychiatric: He has a normal mood and affect. His behavior is normal.     Assessment/Plan Right foot melanoma - plan for  excision and possible LND with Dr. Janee Morn.  Repair will be dictated by size but will need negative margins before final closure.  Craig Martin 10/21/2012, 8:36 AM

## 2012-10-21 NOTE — Brief Op Note (Signed)
10/21/2012  2:07 PM  PATIENT:  Orion Modest  79 y.o. male  PRE-OPERATIVE DIAGNOSIS:  melanoma right foot   POST-OPERATIVE DIAGNOSIS:  Melanoma right foot  PROCEDURE:  Procedure(s): MELANOMA EXCISION (Right) INGUINAL LYMPH NODE BIOPSY (Right) APPLICATION OF A-CELL OF EXTREMITY (Right)  SURGEON:  Surgeon(s) and Role: Panel 1:    * Liz Malady, MD - Primary  Panel 2:    * Wayland Denis, DO - Primary  PHYSICIAN ASSISTANT: Shawn Rayburn, PA  ASSISTANTS: none   ANESTHESIA:   general  EBL:     BLOOD ADMINISTERED:none  DRAINS: none   LOCAL MEDICATIONS USED:  NONE  SPECIMEN:  No Specimen  DISPOSITION OF SPECIMEN:  N/A  COUNTS:  YES  TOURNIQUET:  * No tourniquets in log *  DICTATION: .Dragon Dictation  PLAN OF CARE: Admit for overnight observation  PATIENT DISPOSITION:  PACU - hemodynamically stable.   Delay start of Pharmacological VTE agent (>24hrs) due to surgical blood loss or risk of bleeding: no

## 2012-10-21 NOTE — Anesthesia Procedure Notes (Signed)
Procedure Name: Intubation Date/Time: 10/21/2012 12:47 PM Performed by: Ellin Goodie Pre-anesthesia Checklist: Emergency Drugs available, Patient identified, Suction available, Patient being monitored and Timeout performed Patient Re-evaluated:Patient Re-evaluated prior to inductionOxygen Delivery Method: Circle system utilized Preoxygenation: Pre-oxygenation with 100% oxygen Intubation Type: IV induction Ventilation: Mask ventilation without difficulty Laryngoscope Size: Mac and 3 Grade View: Grade I Tube type: Oral Tube size: 7.5 mm Number of attempts: 1 Airway Equipment and Method: Stylet Secured at: 22 cm Tube secured with: Tape Dental Injury: Teeth and Oropharynx as per pre-operative assessment  Comments: Easy atraumatic induction and intubation.  LMA #5 placed by Dr. Katrinka Blazing with poor seal and air leak noted converted to DL x 1 with MAC 3 blade by myself.  Dr. Katrinka Blazing verified placement of ETT.  Carlynn Herald, CRNA

## 2012-10-21 NOTE — Anesthesia Preprocedure Evaluation (Addendum)
Anesthesia Evaluation  Patient identified by MRN, date of birth, ID band Patient awake    Reviewed: Allergy & Precautions, H&P , NPO status , Patient's Chart, lab work & pertinent test results, reviewed documented beta blocker date and time   Airway Mallampati: II  Neck ROM: Full    Dental  (+) Edentulous Upper and Dental Advisory Given   Pulmonary shortness of breath and with exertion,  breath sounds clear to auscultation        Cardiovascular hypertension, Pt. on medications + CAD + dysrhythmias Rhythm:Regular     Neuro/Psych Seizures -, Well Controlled,  CVA, No Residual Symptoms    GI/Hepatic   Endo/Other  diabetes, Well Controlled, Type 2, Oral Hypoglycemic Agents  Renal/GU      Musculoskeletal   Abdominal (+)  Abdomen: soft. Bowel sounds: normal.  Peds  Hematology  (+) Blood dyscrasia, ,   Anesthesia Other Findings   Reproductive/Obstetrics                         Anesthesia Physical Anesthesia Plan  ASA: III  Anesthesia Plan:    Post-op Pain Management:    Induction:   Airway Management Planned:   Additional Equipment:   Intra-op Plan:   Post-operative Plan:   Informed Consent:   Plan Discussed with:   Anesthesia Plan Comments:         Anesthesia Quick Evaluation

## 2012-10-21 NOTE — Progress Notes (Signed)
Orthopedic Tech Progress Note Patient Details:  Craig Martin 1926/11/25 [id number]  Ortho Devices Type of Ortho Device: CAM walker Ortho Device/Splint Interventions: Ordered As ordered by Dr. Launa Grill, [place] Seaborn 10/21/2012, 2:22 PM

## 2012-10-21 NOTE — Progress Notes (Signed)
Central monitoring called stating pt having runs of V-tach and frequent PVC's.  Pt also in A-fib.  Pt is non-symptomatic and HR is 78-80.  Pt also complaining of surgical pain and pain medicine given.  MD notified and new orders received.  Will carry out and pass information along to next shift RN.  Craig [place]

## 2012-10-21 NOTE — Op Note (Signed)
10/21/2012  1:54 PM  PATIENT:  Craig Martin  79 y.o. male  PRE-OPERATIVE DIAGNOSIS:  Melanoma right lateral foot, right inguinal lymphaenopathy  POST-OPERATIVE DIAGNOSIS:  Melanoma right lateral foot, right inguinal lymphadenopathy  PROCEDURE:  Procedure(s): MELANOMA EXCISION RIGHT FOOT 10X8CM INGUINAL LYMPH NODE BIOPSY  SURGEON:  Violeta Gelinas, M.D.  PHYSICIAN ASSISTANT: Lazaro Arms, PAC  ASSISTANTS: Wayland Denis, D.O.   ANESTHESIA:   local and general  EBL:   25cc  BLOOD ADMINISTERED:none  DRAINS: none   SPECIMEN:  Excision  DISPOSITION OF SPECIMEN:  PATHOLOGY  COUNTS:  YES  DICTATION: .Dragon Dictation Patient has a biopsy-proven melanoma right lateral foot that extends around to the plantar surface. He also has right inguinal lymphadenopathy. He presents for writing the lymph node biopsy and wide excision of his melanoma. Dr. Kelly Splinter will assist and follow with placement of A-cell to the wide excision site. He was identified in the preop holding area. His sites were marked.He received intravenous antibiotics. He is brought to the operating room and general anesthesia was a Optician, dispensing by the anesthesia staff. Attention was first retracted to his right groin. This was prepped and draped in sterile fashion. We did time out procedure. Local anesthetic was injected. Transverse incision was made and subcutaneous tissues were dissected down revealing a very large lymph node. This was circumferentially dissected using cautery on the small lymphatics and vessels. The lymph node was circumferentially dissected and removed. Unfortunately it contained blue staining consistent with metastasis. The wound was irrigated and hemostasis was ensured. Deep tissues were closed with running 3-0 Vicryl and the skin was closed with running 4-0 Monocryl subcuticular followed by Dermabond.  Attention was directed to his foot. This area was prepped and draped in a sterile fashion. A region at least  2 cm circumferentially away from this lesion was measured out. This was an excision area of 10 x 8 cm. Incision was made circumferentially along this demarcation. Subcutaneous tissues were dissected down to the underlying fascia and the lesion was excised. It was oriented for pathology with sutures. Additional deep margins were taken from several areas. Fascia did remain intact. Meticulous hemostasis was obtained with cautery. Dr. Kelly Splinter then proceeded with application of A-cell. Her portion is dictated separately. There were no apparent complications. After completion of Dr. Leonie Green portion the patient was taken recovery in stable condition.  PATIENT DISPOSITION:  PACU - hemodynamically stable.   Delay start of Pharmacological VTE agent (>24hrs) due to surgical blood loss or risk of bleeding:  no  Violeta Gelinas, MD, MPH, FACS Pager: [phone]  10/20/20141:54 PM

## 2012-10-21 NOTE — Progress Notes (Signed)
Pt still running V-Tachs with PVC's but asymptomatic and resting soundly; MD on call notified of recent lab results, new orders given. Will continue to monitor.

## 2012-10-21 NOTE — H&P (View-Only) (Signed)
Patient ID: Craig [place], male   DOB: 02-Nov-1926, 79 y.o.   MRN: [id number]  Chief Complaint  Patient presents with  . Melanoma    HPI Craig Martin is a 79 y.o. male.  Chief complaint: Malignant melanoma lateral right foot HPI Abscesses see this patient in consultation by Dr. Kelly Splinter regarding a melanoma on his right foot. Dr. Kelly Splinter also noted palpable lymphadenopathy in the right groin.The patient initially noticed a small wound develop on his lateral right foot. This did not heal. He underwent biopsy which demonstrated malignant melanoma, 3.3 mm in thickness with positive margins. He has also recently noticed a lump in his right groin. He complains of some mild itching at the foot wound site but no other complaints. Past Medical History  Diagnosis Date  . Glaucoma   . Bradycardia   . Diabetes mellitus   . Coronary artery disease   . Hyperlipidemia   . Hypertension   . Shortness of breath     WITH EXERTION  . Stroke     "I had a stroke many yrs ago" -no residual problems  . BPH (benign prostatic hyperplasia)   . Difficulty voiding   . Dysrhythmia     AFIB  - TAKES PRADAXA FOLLOWED BY DR. Allyson Sabal  . Critical lower limb ischemia     Past Surgical History  Procedure Laterality Date  . Stented coronary artery  2004  . Transurethral resection of prostate  01/24/2012    Procedure: TRANSURETHRAL RESECTION OF THE PROSTATE WITH GYRUS INSTRUMENTS;  Surgeon: Sebastian Ache, MD;  Location: [facility];  Service: Urology;  Laterality: N/A;  . Cardiac catheterization  09/19/2002    moderate LAD and RCA and normal LV function  . Nm myocar perf wall motion  05/24/2011    showed inferior scar w/o ischemia  . Doppler echocardiography  05/24/2011    EF >55%  . Doppler echocardiography  09/28/2009    EF >55% lvh w/ left atrial enlargement,diastolic dysfunctio,elevated lv filling pressure  . Nm myocar perf wall motion  09/09/2009    mild ischemia mid anterior,apical anterior and basal inferolateral   . Nm myocar perf wall motion  12/04/2002  . Nm myocar perf wall motion  05/21/2002  . Cardiac catheterization  07/03/2002    No family history on file.  Social History History  Substance Use Topics  . Smoking status: Former Smoker -- 1.50 packs/day    Types: Cigarettes    Quit date: 01/16/1986  . Smokeless tobacco: Former Neurosurgeon  . Alcohol Use: No    No Known Allergies  Current Outpatient Prescriptions  Medication Sig Dispense Refill  . aspirin EC 81 MG tablet Take 81 mg by mouth daily.      . bimatoprost (LUMIGAN) 0.01 % SOLN Place 1 drop into both eyes at bedtime.      . dabigatran (PRADAXA) 75 MG CAPS Take by mouth every 12 (twelve) hours.      . furosemide (LASIX) 20 MG tablet Take 20 mg by mouth daily.      Marland Kitchen linagliptin (TRADJENTA) 5 MG TABS tablet Take 5 mg by mouth daily.      Marland Kitchen losartan (COZAAR) 25 MG tablet Take 25 mg by mouth daily.      . timolol (BETIMOL) 0.25 % ophthalmic solution Place 1 drop into both eyes 2 (two) times daily.       No current facility-administered medications for this visit.    Review of Systems Review of Systems  Constitutional: Negative for fever, chills and  unexpected weight change.  HENT: Negative for hearing loss, congestion, sore throat, trouble swallowing and voice change.   Eyes: Negative for visual disturbance.  Respiratory: Negative for cough and wheezing.   Cardiovascular: Negative for chest pain, palpitations and leg swelling.  Gastrointestinal: Negative for nausea, vomiting, abdominal pain, diarrhea, constipation, blood in stool, abdominal distention, anal bleeding and rectal pain.  Genitourinary: Negative for hematuria and difficulty urinating.  Musculoskeletal: Negative for arthralgias.  Skin: Positive for wound. Negative for rash.       See history of present illness  Neurological: Negative for seizures, syncope, weakness and headaches.  Hematological: Negative for adenopathy. Does not bruise/bleed easily.   Psychiatric/Behavioral: Negative for confusion.    Blood pressure 170/82, pulse 60, temperature 97.6 F (36.4 C), temperature source Oral, resp. rate 12, height 5\' 9"  (1.753 m), weight 177 lb 6 oz (80.457 kg).  Physical Exam Physical Exam  Constitutional: He appears well-developed. No distress.  HENT:  Head: Normocephalic.  Mouth/Throat: No oropharyngeal exudate.  Eyes: EOM are normal. Pupils are equal, round, and reactive to light.  Neck: Normal range of motion. No tracheal deviation present.  Cardiovascular: Normal rate and normal heart sounds.   Pulmonary/Chest: Effort normal and breath sounds normal. No stridor. No respiratory distress. He has no wheezes. He has no rales.  Abdominal: Soft. He exhibits no distension. There is no tenderness. There is no rebound and no guarding.  Musculoskeletal:  Please see photo in epic. Ulcerated lesion lateral right foot extending from side down to the plantar surface, some crusting is present, no evidence of infection, 2.5 x 4 cm  Neurological: He is alert. He exhibits normal muscle tone.  Skin:  See above  Lymph exam: Palpable lymphadenopathy in right groin 2 x 3 cm, no lifting or lymphadenopathy, no bilateral cervical, axillary or supraclavicular lymphadenopathy.  Data Reviewed Pathology report  Assessment    Malignant melanoma right lateral foot with suspiicous right inguinal lymphadenopathy    Plan    I have offered her excision melanoma right foot with right inguinal lymph node biopsy. This will be under general anesthesia. It'll be done in conjunction with Dr. Kelly Splinter who will address wound closure issues. We will need to hold his aspirin and provide access for 3 days prior to surgery. We will Obtained cardiac clearance from Dr. Allyson Sabal regarding this.Once we obtain a clearance, we will plan to schedule in conjunction with Dr. Kelly Splinter. Patient is agreeable.       [name] 09/25/2012, 11:50 AM

## 2012-10-21 NOTE — Preoperative (Signed)
Beta Blockers   Reason not to administer Beta Blockers:Not Applicable 

## 2012-10-21 NOTE — Anesthesia Postprocedure Evaluation (Signed)
Anesthesia Post Note  Patient: Craig Martin  Procedure(s) Performed: Procedure(s) (LRB): MELANOMA EXCISION (Right) INGUINAL LYMPH NODE BIOPSY (Right) APPLICATION OF A-CELL OF EXTREMITY (Right)  Anesthesia type: General  Patient location: PACU  Post pain: Pain level controlled and Adequate analgesia  Post assessment: Post-op Vital signs reviewed, Patient's Cardiovascular Status Stable, Respiratory Function Stable, Patent Airway and Pain level controlled  Last Vitals:  Filed Vitals:   10/21/12 1600  BP: 136/75  Pulse: 75  Temp:   Resp: 25    Post vital signs: Reviewed and stable  Level of consciousness: awake, alert  and oriented  Complications: No apparent anesthesia complications

## 2012-10-21 NOTE — Transfer of Care (Signed)
Immediate Anesthesia Transfer of Care Note  Patient: Craig Martin  Procedure(s) Performed: Procedure(s): MELANOMA EXCISION (Right) INGUINAL LYMPH NODE BIOPSY (Right) APPLICATION OF A-CELL OF EXTREMITY (Right)  Patient Location: PACU  Anesthesia Type:General  Level of Consciousness: awake, alert  and sedated  Airway & Oxygen Therapy: Patient connected to face mask oxygen  Post-op Assessment: Report given to PACU RN  Post vital signs: stable  Complications: No apparent anesthesia complications

## 2012-10-21 NOTE — Interval H&P Note (Signed)
History and Physical Interval Note:  10/21/2012 12:14 PM  Craig Martin  has presented today for surgery, with the diagnosis of melanoma right lateral foot   The various methods of treatment have been discussed with the patient and family. After consideration of risks, benefits and other options for treatment, the patient has consented to  Procedure(s): MELANOMA EXCISION (Right) INGUINAL LYMPH NODE BIOPSY (Right) as a surgical intervention .  The patient's history has been reviewed, patient re-examined, no change in status, stable for surgery.  I have reviewed the patient's chart and labs.  Questions were answered to the patient's satisfaction.     Jamy Cleckler E

## 2012-10-22 DIAGNOSIS — R5381 Other malaise: Secondary | ICD-10-CM

## 2012-10-22 LAB — GLUCOSE, CAPILLARY
Glucose-Capillary: 126 mg/dL — ABNORMAL HIGH (ref 70–99)
Glucose-Capillary: 147 mg/dL — ABNORMAL HIGH (ref 70–99)
Glucose-Capillary: 152 mg/dL — ABNORMAL HIGH (ref 70–99)
Glucose-Capillary: 107 mg/dL — ABNORMAL HIGH (ref 70–99)

## 2012-10-22 MED ORDER — OXYCODONE-ACETAMINOPHEN 5-325 MG PO TABS: 1.0000 | ORAL_TABLET | Freq: Four times a day (QID) | ORAL | Status: DC | PRN

## 2012-10-22 MED ORDER — TAMSULOSIN HCL 0.4 MG PO CAPS
0.4000 mg | ORAL_CAPSULE | Freq: Every day | ORAL | Status: DC
  Administered 2012-10-22 – 2012-10-23 (×2): 0.4 mg via ORAL
  Filled 2012-10-22 (×4): qty 1

## 2012-10-22 NOTE — Discharge Summary (Signed)
Physician Discharge Summary  Patient ID: Craig Martin MRN: [id number] DOB/AGE: 05-Jun-1926 79 y.o.  Admit date: 10/21/2012 Discharge date: 10/22/2012  Admission Diagnoses:Melanoma right foot, right inguinal lymphadenopathy  Discharge Diagnoses: Melanoma right foot, right inguinal lymphadenopathy Active Problems:   * No active hospital problems. *   Discharged Condition: good  Hospital Course: Patient underwent wide excision melanoma right foot with a-cell coverage in conjunction with Dr. Kelly Splinter.In addition, he underwent right inguinal lymph node biopsy. Postoperatively he did well. He had good pain control. He was monitored on telemetry. He a few very short runs of V. Tach. Just a couple beats at a time. I discussed this with Dr. Gery Pray, his cardiologist. This is his baseline and he has been asymptomatic. Potassium was corrected. Dr. Gery Pray plans to see him back in the office And was fine with him going home today. He otherwise remained stable. Potassium was replaced. He is discharged on postoperative day 1 in stable condition.  Consults: pphone consultation with cardiology as above  Significant Diagnostic Studies: .  Treatments: Surgery as above  Discharge Exam: Blood pressure 118/64, pulse 85, temperature 97.8 F (36.6 C), temperature source Oral, resp. rate 16, height 5\' 9"  (1.753 m), weight 82.6 kg (182 lb 1.6 oz), SpO2 100.00%. See note from earlier today  Disposition: 01-Home or Self Care  Discharge Orders   Future Orders Complete By Expires   Diet - low sodium heart healthy  As directed    Discharge instructions  As directed    Comments:     Continue wearing the boot right lower extremity. Followup with Dr. Kelly Splinter this week. My nurse will call you regarding an appointment in my office as well.   Discharge wound care:  As directed    Comments:     Leave dressing in place until you see Dr. Kelly Splinter   Increase activity slowly  As directed        Medication List          aspirin EC 81 MG tablet  Take 81 mg by mouth daily.     dabigatran 75 MG Caps capsule  Commonly known as:  PRADAXA  Take by mouth every 12 (twelve) hours.     furosemide 20 MG tablet  Commonly known as:  LASIX  Take 20 mg by mouth daily.     linagliptin 5 MG Tabs tablet  Commonly known as:  TRADJENTA  Take 5 mg by mouth daily.     losartan 25 MG tablet  Commonly known as:  COZAAR  Take 25 mg by mouth daily.     LUMIGAN 0.01 % Soln  Generic drug:  bimatoprost  Place 1 drop into both eyes at bedtime.     oxyCODONE-acetaminophen 5-325 MG per tablet  Commonly known as:  PERCOCET/ROXICET  Take 1-2 tablets by mouth every 6 (six) hours as needed for pain.     timolol 0.5 % ophthalmic solution  Commonly known as:  TIMOPTIC  Place 1 drop into both eyes daily.           Follow-up Information   Follow up with [facility], DO. Schedule an appointment as soon as possible for a visit today. (see her this week)    Specialty:  Plastic Surgery   Contact information:   [address] [place] [address] [phone]       Follow up with Liz Malady, MD. Schedule an appointment as soon as possible for a visit in 3 weeks.   Specialty:  General Surgery  Contact information:   [address] [place] [address] [phone]       Signed: Liz Malady 10/22/2012, 10:58 AM

## 2012-10-22 NOTE — Progress Notes (Addendum)
1 Day Post-Op  Subjective: Good pain control. Short runs of VT overnight - asymptomatic. K replaced last night.  Objective: Vital signs in last 24 hours: Temp:  [97.2 F (36.2 C)-98.1 F (36.7 C)] 98.1 F (36.7 C) (10/21 0534) Pulse Rate:  [65-80] 65 (10/21 0534) Resp:  [15-25] 17 (10/21 0534) BP: (136-189)/(75-114) 161/76 mmHg (10/21 0534) SpO2:  [98 %-100 %] 98 % (10/21 0534) Weight:  [82.6 kg (182 lb 1.6 oz)] 82.6 kg (182 lb 1.6 oz) (10/21 0534) Last BM Date: 10/18/12  Intake/Output from previous day: 10/20 0701 - 10/21 0700 In: 840 [P.O.:240; I.V.:600] Out: 650 [Urine:650] Intake/Output this shift:    General appearance: alert and cooperative Resp: clear to auscultation bilaterally Cardio: irregularly irregular rhythm Extremities: boot and dressing right foot, toes warm R groin incision CDI  Lab Results:  No results found for this basename: WBC, HGB, HCT, PLT,  in the last 72 hours BMET  Recent Labs  10/21/12 2100  NA 137  K 3.2*  CL 101  CO2 22  GLUCOSE 166*  BUN 25*  CREATININE 1.61*  CALCIUM 8.7   PT/INR No results found for this basename: LABPROT, INR,  in the last 72 hours ABG No results found for this basename: PHART, PCO2, PO2, HCO3,  in the last 72 hours  Studies/Results: No results found.  Anti-infectives: Anti-infectives   Start     Dose/Rate Route Frequency Ordered Stop   10/21/12 1418  polymyxin B 500,000 Units, bacitracin 50,000 Units in sodium chloride irrigation 0.9 % 500 mL irrigation  Status:  Discontinued       As needed 10/21/12 1418 10/21/12 1421   10/21/12 0600  ceFAZolin (ANCEF) IVPB 2 g/50 mL premix     2 g 100 mL/hr over 30 Minutes Intravenous On call to O.R. 10/20/12 1547 10/21/12 1246      Assessment/Plan: s/p Procedure(s): MELANOMA EXCISION (Right) INGUINAL LYMPH NODE BIOPSY (Right) APPLICATION OF A-CELL OF EXTREMITY (Right) POD#1 VT - will ask Dr. Allyson Sabal to evaluate Hypokalemia - replaced last night D/C once  cardiac issue evaluated  LOS: 1 day    Sedona Wenk E 10/22/2012

## 2012-10-22 NOTE — Evaluation (Signed)
Physical Therapy Evaluation Patient Details Name: Craig Martin MRN: [id number] DOB: 1926-06-17 Today's Date: 10/22/2012 Time: 4098-1191 PT Time Calculation (min): 24 min  PT Assessment / Plan / Recommendation History of Present Illness  Patient underwent wide excision melanoma right foot with a-cell coverage in conjunction with Dr. Kelly Splinter.In addition, he underwent right inguinal lymph node biopsy.  Clinical Impression  Pt admitted with above. Pt currently with functional limitations due to the deficits listed below (see PT Problem List). Pt lives with brother who is in w/c. Feel ST-SNF would be best option for pt.     PT Assessment  Patient needs continued PT services    Follow Up Recommendations  SNF    Does the patient have the potential to tolerate intense rehabilitation      Barriers to Discharge Decreased caregiver support      Equipment Recommendations  Rolling walker with 5" wheels;Wheelchair (measurements PT)    Recommendations for Other Services     Frequency Min 5X/week    Precautions / Restrictions Precautions Precautions: Fall Required Braces or Orthoses: Other Brace/Splint Other Brace/Splint: CAM boot rt foot. Restrictions RLE Weight Bearing: Non weight bearing   Pertinent Vitals/Pain VSS      Mobility  Transfers Transfers: Sit to Stand;Stand to Sit Sit to Stand: 4: Min assist;With upper extremity assist;With armrests;From chair/3-in-1 Stand to Sit: 4: Min assist;With upper extremity assist;With armrests;To chair/3-in-1 Details for Transfer Assistance: Verbal/tactile cues to maintain NWB on RLE. Ambulation/Gait Ambulation/Gait Assistance: 4: Min assist Ambulation Distance (Feet): 6 Feet (x 2) Assistive device: Rolling walker Ambulation/Gait Assistance Details: Verbal/tactile cues to maintain NWB on RLE.  After 2-3' pt with great difficulty maintaining NWB on RLE. Gait Pattern:  (Hop to gait with LLE) Gait velocity: decr    Exercises     PT  Diagnosis: Difficulty walking;Generalized weakness  PT Problem List: Decreased strength;Decreased activity tolerance;Decreased balance;Decreased mobility;Decreased knowledge of use of DME;Decreased knowledge of precautions PT Treatment Interventions: DME instruction;Gait training;Patient/family education;Functional mobility training;Therapeutic activities     PT Goals(Current goals can be found in the care plan section) Acute Rehab PT Goals Patient Stated Goal: Eventually return home  PT Goal Formulation: With patient Time For Goal Achievement: 10/29/12 Potential to Achieve Goals: Good  Visit Information  Last PT Received On: 10/22/12 Assistance Needed: +1 History of Present Illness: Patient underwent wide excision melanoma right foot with a-cell coverage in conjunction with Dr. Kelly Splinter.In addition, he underwent right inguinal lymph node biopsy.       Prior Functioning  Home Living Family/patient expects to be discharged to:: Private residence Living Arrangements: Other relatives Available Help at Discharge: Family (brother is w/c bound) Home Access: Ramped entrance Home Layout: One level Home Equipment: None Prior Function Level of Independence: Independent Communication Communication: No difficulties    Cognition  Cognition Arousal/Alertness: Awake/alert Behavior During Therapy: WFL for tasks assessed/performed Overall Cognitive Status: Within Functional Limits for tasks assessed    Extremity/Trunk Assessment Upper Extremity Assessment Upper Extremity Assessment: Generalized weakness Lower Extremity Assessment Lower Extremity Assessment: RLE deficits/detail RLE Deficits / Details: difficulty holding rt foot off of floor with CAM walker on.   Balance Balance Balance Assessed: Yes Static Standing Balance Static Standing - Balance Support: Bilateral upper extremity supported Static Standing - Level of Assistance: 4: Min assist  End of Session PT - End of  Session Activity Tolerance: Patient limited by fatigue;Other (comment) (Limited by NWB) Patient left: in chair;with call bell/phone within reach Nurse Communication: Mobility status  GP  Khloei Spiker 10/22/2012, 2:32 PM  [facility] PT [phone]

## 2012-10-22 NOTE — OR Nursing (Addendum)
Edits made to Intra-Op on 10/21/12 by C. Precious Reel, RN and 10/22/12 by Shela Commons. Sipsis, RN to accurately reflect OR record. Verbally confirmed specimens with Kristeen Mans (pathology) and updated the OR record to reflect OR procedure.

## 2012-10-22 NOTE — Progress Notes (Signed)
Pt c/o "dribbling" all day. MD notified. Order for I & O cath. NT unable to get urine from straight cath. Small amount of blood present. Notified MD. Order to place smaller foley. Insertion of 14 Fr cath was unsuccessful. Coude ordered.

## 2012-10-23 ENCOUNTER — Encounter (HOSPITAL_COMMUNITY): Payer: Self-pay | Admitting: Cardiology

## 2012-10-23 DIAGNOSIS — I059 Rheumatic mitral valve disease, unspecified: Secondary | ICD-10-CM

## 2012-10-23 DIAGNOSIS — I472 Ventricular tachycardia, unspecified: Secondary | ICD-10-CM

## 2012-10-23 DIAGNOSIS — R339 Retention of urine, unspecified: Secondary | ICD-10-CM

## 2012-10-23 DIAGNOSIS — I2510 Atherosclerotic heart disease of native coronary artery without angina pectoris: Secondary | ICD-10-CM

## 2012-10-23 DIAGNOSIS — I4891 Unspecified atrial fibrillation: Secondary | ICD-10-CM

## 2012-10-23 LAB — URINALYSIS, ROUTINE W REFLEX MICROSCOPIC
Bilirubin Urine: NEGATIVE
Glucose, UA: NEGATIVE mg/dL
Ketones, ur: NEGATIVE mg/dL
Leukocytes, UA: NEGATIVE
Nitrite: NEGATIVE
Protein, ur: NEGATIVE mg/dL
Urobilinogen, UA: 0.2 mg/dL (ref 0.0–1.0)

## 2012-10-23 LAB — BASIC METABOLIC PANEL
Calcium: 8.7 mg/dL (ref 8.4–10.5)
Creatinine, Ser: 2.73 mg/dL — ABNORMAL HIGH (ref 0.50–1.35)
GFR calc Af Amer: 23 mL/min — ABNORMAL LOW (ref >90)
GFR calc non Af Amer: 20 mL/min — ABNORMAL LOW (ref >90)
Glucose, Bld: 144 mg/dL — ABNORMAL HIGH (ref 70–99)
Sodium: 135 mEq/L (ref 135–145)

## 2012-10-23 LAB — MAGNESIUM: Magnesium: 2.1 mg/dL (ref 1.5–2.5)

## 2012-10-23 LAB — GLUCOSE, CAPILLARY
Glucose-Capillary: 145 mg/dL — ABNORMAL HIGH (ref 70–99)
Glucose-Capillary: 142 mg/dL — ABNORMAL HIGH (ref 70–99)
Glucose-Capillary: 171 mg/dL — ABNORMAL HIGH (ref 70–99)

## 2012-10-23 LAB — TROPONIN I: Troponin I: <0.3 ng/mL (ref <0.30)

## 2012-10-23 MED ORDER — METOPROLOL TARTRATE 12.5 MG HALF TABLET
12.5000 mg | ORAL_TABLET | Freq: Two times a day (BID) | ORAL | Status: DC
  Administered 2012-10-23 – 2012-10-24 (×2): 12.5 mg via ORAL
  Filled 2012-10-23 (×4): qty 1

## 2012-10-23 MED ORDER — DOCUSATE SODIUM 100 MG PO CAPS
100.0000 mg | ORAL_CAPSULE | Freq: Two times a day (BID) | ORAL | Status: DC
  Administered 2012-10-23 – 2012-10-24 (×2): 100 mg via ORAL
  Filled 2012-10-23: qty 1

## 2012-10-23 NOTE — Clinical Social Work Psychosocial (Signed)
Clinical Social Work Department BRIEF PSYCHOSOCIAL ASSESSMENT 10/23/2012  Patient:  Craig Martin, Craig Martin     Account Number:  [id number]     Admit date:  10/21/2012  Clinical Social Worker:  Lavell Luster  Date/Time:  10/23/2012 11:01 AM  Referred by:  Physician  Date Referred:  10/23/2012 Referred for  SNF Placement   Other Referral:   Interview type:  Patient Other interview type:   Patient interveiwed. Patient alert and oriented x4 at time of assessment.    PSYCHOSOCIAL DATA Living Status:  SIBLING Admitted from facility:   Level of care:   Primary support name:  Craig Martin [phone] Primary support relationship to patient:  SIBLING Degree of support available:   Support is very limited. Patient has brother, but borther is wheelchair bound. Patient mentioned they have a friend that helps them run errands.    CURRENT CONCERNS Current Concerns  Post-Acute Placement   Other Concerns:    SOCIAL WORK ASSESSMENT / PLAN CSW met with patient to discuss recommendation for SNF placement. Patient is agreeable to SNF placement and the SNF search process. Patient wants facility in [facility]. Patient does not have preference at this time and denies past SNF experience. Patient is from home with brother who is wheel chair bound. Patient plans to return home with brother after SNF stay.   Assessment/plan status:  Psychosocial Support/Ongoing Assessment of Needs Other assessment/ plan:   Complete FL2, Fax out,   Information/referral to community resources:   SNF list and CSW contact information given to patient.    PATIENT'S/FAMILY'S RESPONSE TO PLAN OF CARE: Patient is a pleasant gentleman and was appreciative of CSW contact. Patient is agreeable to SNF placement and awaits bed offers.      Roddie Mc, [place], [place], [phone]

## 2012-10-23 NOTE — Consult Note (Addendum)
Pt. Seen and examined. Agree with the NP/PA-C note as written. 79 yo male patient of Dr. Allyson Sabal with PAD, but with a history of CAD s/p PCI to the LCx in 2004 by Dr. Jenne Campus and moderate LAD and RCA disease. He underwent a melanoma excision and lymph node biopsy.  He was noted to have post-op a-fib and PVC's. He has also had NSVT - a few runs of 8-9 beats, generally asymptomatic. EKG performed today shows a-fib, LAFB, and Right bundle morphology PVC's, suggesting an LV origin. I reviewed his echocardiogram today, there may be some inferobasal hypokinesis, but generally wall motion was normal, EF is well-preserved at ~60%, nothing other notable.  Impression: 1.  Probable scar mediated PVC's / VT - asymptomatic 2.  LVEF 60-65% 3.  Chronic atrial fibrillation  Recommend: 1.  Although he is asymptomatic and his echo is re-assuring, he should undergo a repeat nuclear stress test in our office to r/o any new ischemia (last was negative, but showed inferior scar in 05/2011).  I suspect this is scar mediated ventricular ectopy. He is at low risk for sudden death from sustained VT given normal systolic function.  With known CAD and NSVT, he should be on a b-blocker. He has a history of bradycardia and b-blocker may have been stopped in the past. Therefore, I would recommend metoprolol tartrate 12.5 mg BID, which is shorter acting. If necessary, his low dose losartan could be discontinued to allow sufficient BP.  Would recommend follow-up with Dr. Allyson Sabal (post stress-test) in the office. It would also be reasonable to arrange a 2 week monitor to look for significant bradycardia or pauses on metoprolol - if this is the case, he may ultimately need a pacemaker or depending on his VT/PVC burden, evaluation for ablation.  Please contact us with any further questions.  Thanks for the consult.  Chrystie Nose, MD, [facility] Attending Cardiologist [facility]

## 2012-10-23 NOTE — Progress Notes (Signed)
Bladder scanned patient, retained 944 mls of urine, Dr. Ovidio Hanger made aware.

## 2012-10-23 NOTE — Progress Notes (Signed)
Physical Therapy Treatment Patient Details Name: Johntay Doolen MRN: [id number] DOB: 07/07/1926 Today's Date: 10/23/2012 Time: 4098-1191 PT Time Calculation (min): 26 min  PT Assessment / Plan / Recommendation  History of Present Illness Patient underwent wide excision melanoma right foot with a-cell coverage in conjunction with Dr. Kelly Splinter.In addition, he underwent right inguinal lymph node biopsy.   PT Comments   Patient still continues to have difficulty with NWB and hopping. Patient agreeable and workin hard but becomes fatigued and requires rest breaks  Follow Up Recommendations  SNF     Does the patient have the potential to tolerate intense rehabilitation     Barriers to Discharge        Equipment Recommendations  Rolling walker with 5" wheels;Wheelchair (measurements PT)    Recommendations for Other Services    Frequency Min 5X/week   Progress towards PT Goals Progress towards PT goals: Progressing toward goals  Plan Current plan remains appropriate    Precautions / Restrictions Precautions Precautions: Fall Required Braces or Orthoses: Other Brace/Splint Other Brace/Splint: CAM boot rt foot. Restrictions Weight Bearing Restrictions: Yes RLE Weight Bearing: Non weight bearing   Pertinent Vitals/Pain no apparent distress     Mobility  Transfers Sit to Stand: 4: Min assist;With upper extremity assist;With armrests;From chair/3-in-1;From bed Stand to Sit: 4: Min assist;With upper extremity assist;With armrests;To chair/3-in-1 Details for Transfer Assistance: Patient practiced multiple times to ensure safe technique and work on maintaining NWB on L foot. Patient tended to fall back into recliner Ambulation/Gait Ambulation/Gait Assistance: 4: Min assist Ambulation Distance (Feet): 10 Feet Assistive device: Rolling walker Ambulation/Gait Assistance Details: A for balance and to maintain NWB. very difficult for patient but able to take a couple more hops than  yesterday Gait Pattern: Step-to pattern Gait velocity: decr    Exercises     PT Diagnosis:    PT Problem List:   PT Treatment Interventions:     PT Goals (current goals can now be found in the care plan section)    Visit Information  Last PT Received On: 10/23/12 Assistance Needed: +1 History of Present Illness: Patient underwent wide excision melanoma right foot with a-cell coverage in conjunction with Dr. Kelly Splinter.In addition, he underwent right inguinal lymph node biopsy.    Subjective Data      Cognition  Cognition Arousal/Alertness: Awake/alert Behavior During Therapy: WFL for tasks assessed/performed Overall Cognitive Status: Within Functional Limits for tasks assessed    Balance  Static Standing Balance Static Standing - Balance Support: Bilateral upper extremity supported Static Standing - Level of Assistance: 4: Min assist  End of Session PT - End of Session Equipment Utilized During Treatment: Gait belt Activity Tolerance: Patient tolerated treatment well;Patient limited by fatigue Patient left: in chair;with call bell/phone within reach   GP     Robinette, Adline Potter 10/23/2012, 2:40 PM 10/23/2012 Fredrich Birks PTA [phone] pager [phone] office

## 2012-10-23 NOTE — Progress Notes (Signed)
PT Late G-code Entry   10/22/12 1445  PT G-Codes **NOT FOR INPATIENT CLASS**  Functional Assessment Tool Used clinical judgement  Functional Limitation Mobility: Walking and moving around  Mobility: Walking and Moving Around Current Status [phone]) CI  Mobility: Walking and Moving Around Goal Status [phone]) [facility] PT [phone]

## 2012-10-23 NOTE — Progress Notes (Signed)
Pt having long sustained runs of V-tach longest of which is 16 runs around 0530; VS taken BP 159/86 P-77 R- 16 100% RA; afebrile; pt. sleeping soundly on bed, in no acute distress or complaints of pain; MD on call paged and notified of event and gave orders for lab draws and STAT EKG; will further evaluate during the morning rounds. Will continue to monitor.

## 2012-10-23 NOTE — Consult Note (Signed)
Reason for Consult: Bursts of NSVT   Referring Physician: Dr. Leonard Martin. Craig Martin PCP: Craig Aliment, MD Primary Cardiologist:Dr. Keionte Martin is an 79 y.o. male.    Chief Complaint:  Pt admitted for melanoma excision Rt foot 10/21/12   HPI: 79 year old thin-appearing single [place] American male with no children followed by Dr. Allyson Martin for PAD and cardiology.   He has a history of CAD, status post circumflex stenting by Dr. Lenise Martin in 2004 with moderate LAD and RCA disease and normal LV function. His other problems include non-insulin-requiring diabetes, hypertension, and hyperlipidemia. He had a stress test done May 24, 2011, that showed inferior scar without ischemia and 2D echo revealed normal LV function.  He does have Permanent AFib with controlled ventricular response on Pradaxa.  He's developed a poorly healing ulcer on the lateral aspect of his right heel of several months and has been treated at the [facility] wound care center. He had arterial Dopplers performed in our office 05/29/12 which revealed a right ABI 1.2 but calcified noncompressible vessels with an occluded posterior tibial and proximal portion reconstituted. I'm concerned that he is ulcer may not heal based on his posterior tibial disease. His right ABI was 0.33 suggesting some post perfusion for healing. Heel was biopsied and diagnosed with melanoma.  He underwent Rt heel excision of a melanoma 10/21/12 by Dr. Violeta Martin. Dr. Allyson Martin discussed Pt. with Dr. Janee Martin and have informed him that Craig Martin in the right TBI is 0.33 making it potentially unlikely for his wound to heal. Dr. Shella Martin also was following him at the wound care center. Should this would not heal after the surgical procedure with him and further address the possibility of tibial vessel intervention versus below-the-knee amputation.   Now pt with runs of NSVT.  Certainly wide complex tachycardia, multifocal beats at times.   Several long runs of 10-16 sec.  Pt not aware of arrhythmia, no chest pain, no SOB, no lightheadedness, no dizziness.  Currently unable to void- Primary service attempting foley.  Initially K+ low, now 4.6, Mg. Is 2.1.  Cr. Is elevated. Troponin is negative. Mild anemia.   Past Medical History  Diagnosis Date  . Glaucoma   . Bradycardia   . Diabetes mellitus   . Coronary artery disease   . Hyperlipidemia   . Hypertension   . Shortness of breath     WITH EXERTION  . Stroke     "I had a stroke many yrs ago" -no residual problems  . BPH (benign prostatic hyperplasia)   . Difficulty voiding   . Dysrhythmia     AFIB  - TAKES PRADAXA FOLLOWED BY DR. Allyson Martin  . Critical lower limb ischemia   . Melanoma   . Arthritis     Past Surgical History  Procedure Laterality Date  . Stented coronary artery  2004  . Transurethral resection of prostate  01/24/2012    Procedure: TRANSURETHRAL RESECTION OF THE PROSTATE WITH GYRUS INSTRUMENTS;  Surgeon: Craig Ache, MD;  Location: [facility];  Service: Urology;  Laterality: N/A;  . Cardiac catheterization  09/19/2002    moderate LAD and RCA and normal LV function  . Nm myocar perf wall motion  05/24/2011    showed inferior scar w/o ischemia  . Doppler echocardiography  05/24/2011    EF >55%  . Doppler echocardiography  09/28/2009    EF >55% lvh w/ left atrial enlargement,diastolic dysfunctio,elevated lv filling pressure  .  Nm myocar perf wall motion  09/09/2009    mild ischemia mid anterior,apical anterior and basal inferolateral  . Nm myocar perf wall motion  12/04/2002  . Nm myocar perf wall motion  05/21/2002  . Cardiac catheterization  07/03/2002  . Cataract extraction      History reviewed. No pertinent family history. Social History:  reports that he quit smoking about 26 years ago. His smoking use included Cigarettes. He smoked 1.50 packs per day. He has quit using smokeless tobacco. He reports that he does not drink alcohol or use illicit  drugs.  Allergies: No Known Allergies  Medications Prior to Admission  Medication Sig Dispense Refill  . aspirin EC 81 MG tablet Take 81 mg by mouth daily.      . bimatoprost (LUMIGAN) 0.01 % SOLN Place 1 drop into both eyes at bedtime.      . dabigatran (PRADAXA) 75 MG CAPS Take by mouth every 12 (twelve) hours.      . furosemide (LASIX) 20 MG tablet Take 20 mg by mouth daily.      Marland Kitchen linagliptin (TRADJENTA) 5 MG TABS tablet Take 5 mg by mouth daily.      Marland Kitchen losartan (COZAAR) 25 MG tablet Take 25 mg by mouth daily.      . timolol (TIMOPTIC) 0.5 % ophthalmic solution Place 1 drop into both eyes daily.         Results for orders placed during the hospital encounter of 10/21/12 (from the past 48 hour(s))  GLUCOSE, CAPILLARY     Status: Abnormal   Collection Time    10/21/12  5:17 PM      Result Value Range   Glucose-Capillary 150 (*) 70 - 99 mg/dL  BASIC METABOLIC PANEL     Status: Abnormal   Collection Time    10/21/12  9:00 PM      Result Value Range   Sodium 137  135 - 145 mEq/L   Potassium 3.2 (*) 3.5 - 5.1 mEq/L   Chloride 101  96 - 112 mEq/L   CO2 22  19 - 32 mEq/L   Glucose, Bld 166 (*) 70 - 99 mg/dL   BUN 25 (*) 6 - 23 mg/dL   Creatinine, Ser 1.61 (*) 0.50 - 1.35 mg/dL   Calcium 8.7  8.4 - 09.6 mg/dL   GFR calc non Af Amer 38 (*) >90 mL/min   GFR calc Af Amer 44 (*) >90 mL/min   Comment: (NOTE)     The eGFR has been calculated using the CKD EPI equation.     This calculation has not been validated in all clinical situations.     eGFR's persistently <90 mL/min signify possible Chronic Kidney     Disease.  MAGNESIUM     Status: None   Collection Time    10/21/12  9:00 PM      Result Value Range   Magnesium 1.5  1.5 - 2.5 mg/dL  GLUCOSE, CAPILLARY     Status: Abnormal   Collection Time    10/21/12 10:07 PM      Result Value Range   Glucose-Capillary 156 (*) 70 - 99 mg/dL  GLUCOSE, CAPILLARY     Status: Abnormal   Collection Time    10/22/12  7:35 AM      Result  Value Range   Glucose-Capillary 126 (*) 70 - 99 mg/dL  GLUCOSE, CAPILLARY     Status: Abnormal   Collection Time    10/22/12 11:56 AM  Result Value Range   Glucose-Capillary 147 (*) 70 - 99 mg/dL   Comment 1 Notify RN    GLUCOSE, CAPILLARY     Status: Abnormal   Collection Time    10/22/12  4:56 PM      Result Value Range   Glucose-Capillary 152 (*) 70 - 99 mg/dL   Comment 1 Notify RN    GLUCOSE, CAPILLARY     Status: Abnormal   Collection Time    10/22/12 10:19 PM      Result Value Range   Glucose-Capillary 107 (*) 70 - 99 mg/dL   Comment 1 Notify RN     Comment 2 Documented in Chart    GLUCOSE, CAPILLARY     Status: Abnormal   Collection Time    10/23/12  7:30 AM      Result Value Range   Glucose-Capillary 145 (*) 70 - 99 mg/dL  TROPONIN I     Status: None   Collection Time    10/23/12  7:32 AM      Result Value Range   Troponin I <0.30  <0.30 ng/mL   Comment:            Due to the release kinetics of cTnI,     a negative result within the first hours     of the onset of symptoms does not rule out     myocardial infarction with certainty.     If myocardial infarction is still suspected,     repeat the test at appropriate intervals.  MAGNESIUM     Status: None   Collection Time    10/23/12  7:32 AM      Result Value Range   Magnesium 2.1  1.5 - 2.5 mg/dL  BASIC METABOLIC PANEL     Status: Abnormal   Collection Time    10/23/12  7:32 AM      Result Value Range   Sodium 135  135 - 145 mEq/L   Potassium 4.6  3.5 - 5.1 mEq/L   Chloride 100  96 - 112 mEq/L   CO2 23  19 - 32 mEq/L   Glucose, Bld 144 (*) 70 - 99 mg/dL   BUN 26 (*) 6 - 23 mg/dL   Creatinine, Ser 7.82 (*) 0.50 - 1.35 mg/dL   Comment: REPEATED TO VERIFY   Calcium 8.7  8.4 - 10.5 mg/dL   GFR calc non Af Amer 20 (*) >90 mL/min   GFR calc Af Amer 23 (*) >90 mL/min   Comment: (NOTE)     The eGFR has been calculated using the CKD EPI equation.     This calculation has not been validated in all  clinical situations.     eGFR's persistently <90 mL/min signify possible Chronic Kidney     Disease.  GLUCOSE, CAPILLARY     Status: Abnormal   Collection Time    10/23/12 11:51 AM      Result Value Range   Glucose-Capillary 142 (*) 70 - 99 mg/dL   No results found.  ROS: General:no colds or fevers, no weight changes Skin:no rashes or ulcers, + surgery Rt heel for melanoma  HEENT:no blurred vision, no congestion CV:see HPI PUL:see HPI GI:no diarrhea constipation or melena, no indigestion GU:no hematuria, no dysuria MS:no joint pain, no claudication Neuro:no syncope, no lightheadedness Endo:+ diabetes, no thyroid disease   Blood pressure 159/86, pulse 77, temperature 98.1 F (36.7 C), temperature source Oral, resp. rate 20, height 5\' 9"  (1.753 m), weight 183  lb 3.2 oz (83.1 kg), SpO2 100.00%. PE: General:Pleasant affect, NAD Skin:Warm and dry, brisk capillary refill HEENT:normocephalic, sclera clear, mucus membranes moist Neck:supple, no JVD, no bruits  Heart:irreg irreg without murmur, gallup, rub or click Lungs:clear ant. without rales, rhonchi, or wheezes VWU:JWJX, non tender, + BS, do not palpate liver spleen or masses Ext:no lower ext edema, Rt. Lower ext in boot,  2+ radial pulses Neuro:alert and oriented, MAE, follows commands, + facial symmetry    Assessment/Plan Principal Problem:   Malignant melanoma of right foot Active Problems:   Coronary artery disease   Critical lower limb ischemia   Essential hypertension   Hyperlipidemia   Diabetes   Chronic atrial fibrillation   NSVT (nonsustained ventricular tachycardia)  PLAN: troponin is neg.  No chest pain, no SOB (or no more than his usual).   Will check echo , previous EF 55%.   MD to see, ? [place].   [facility] R  Nurse Practitioner Certified [facility] Pager [phone] 10/23/2012, 12:21 PM

## 2012-10-23 NOTE — Progress Notes (Signed)
Coude catheter insertion unsuccessful; 12 French inserted as well but no urine backflow; MD on call paged and notified of failed attempts; gave no further order. Will continue to monitor.

## 2012-10-23 NOTE — Progress Notes (Signed)
2 Days Post-Op  Subjective: Dribbling urine, unable to void. Further runs of asymptomatic VT during the night.  Objective: Vital signs in last 24 hours: Temp:  [97.8 F (36.6 C)-98.1 F (36.7 C)] 98.1 F (36.7 C) (10/22 0538) Pulse Rate:  [73-85] 77 (10/22 0538) Resp:  [16-20] 20 (10/22 0538) BP: (118-159)/(64-86) 159/86 mmHg (10/22 0538) SpO2:  [100 %] 100 % (10/22 0538) Weight:  [83.1 kg (183 lb 3.2 oz)] 83.1 kg (183 lb 3.2 oz) (10/22 0538) Last BM Date: 10/18/12  Intake/Output from previous day: 10/21 0701 - 10/22 0700 In: 120 [P.O.:120] Out: 200 [Urine:200] Intake/Output this shift:    General appearance: alert and cooperative Resp: clear to auscultation bilaterally Cardio: irregularly irregular rhythm GI: soft, NT Right groin incision CDI R foot dressing in place, boot on  Lab Results:  No results found for this basename: WBC, HGB, HCT, PLT,  in the last 72 hours BMET  Recent Labs  10/21/12 2100  NA 137  K 3.2*  CL 101  CO2 22  GLUCOSE 166*  BUN 25*  CREATININE 1.61*  CALCIUM 8.7   PT/INR No results found for this basename: LABPROT, INR,  in the last 72 hours ABG No results found for this basename: PHART, PCO2, PO2, HCO3,  in the last 72 hours  Studies/Results: No results found.  Anti-infectives: Anti-infectives   Start     Dose/Rate Route Frequency Ordered Stop   10/21/12 1418  polymyxin B 500,000 Units, bacitracin 50,000 Units in sodium chloride irrigation 0.9 % 500 mL irrigation  Status:  Discontinued       As needed 10/21/12 1418 10/21/12 1421   10/21/12 0600  ceFAZolin (ANCEF) IVPB 2 g/50 mL premix     2 g 100 mL/hr over 30 Minutes Intravenous On call to O.R. 10/20/12 1547 10/21/12 1246      Assessment/Plan: s/p Procedure(s): MELANOMA EXCISION (Right) INGUINAL LYMPH NODE BIOPSY (Right) APPLICATION OF A-CELL OF EXTREMITY (Right) POD#2 Urinary retention - flomax started yesterday, I asked urology to see this AM Deconditioning - NWB RLE  per Dr. Kelly Splinter. PT eval rec SNF. Patient is willing to go. Will ask CSW to evaluate for placement. VT - runs somewhat longer - 8-9 beats. Is asymptomatic. I asked cardiology to see. Troponin P and I ordered BMET. VTE - heparin  LOS: 2 days    Craig Martin 10/23/2012

## 2012-10-23 NOTE — Progress Notes (Signed)
  Echocardiogram 2D Echocardiogram has been performed.  Craig Martin 10/23/2012, 1:57 PM

## 2012-10-23 NOTE — Clinical Social Work Placement (Signed)
Clinical Social Work Department CLINICAL SOCIAL WORK PLACEMENT NOTE 10/23/2012  Patient:  Craig Martin, Craig Martin  Account Number:  [id number] Admit date:  10/21/2012  Clinical Social Worker:  Cherre Blanc, [place]  Date/time:  10/23/2012 11:06 AM  Clinical Social Work is seeking post-discharge placement for this patient at the following level of care:   SKILLED NURSING   (*CSW will update this form in Epic as items are completed)   10/23/2012  Patient/family provided with [facility] Department of Clinical Social Work's list of facilities offering this level of care within the geographic area requested by the patient (or if unable, by the patient's family).  10/23/2012  Patient/family informed of their freedom to choose among providers that offer the needed level of care, that participate in Medicare, Medicaid or managed care program needed by the patient, have an available bed and are willing to accept the patient.  10/23/2012  Patient/family informed of [facility]' ownership interest in [facility], as well as of the fact that they are under no obligation to receive care at this facility.  PASARR submitted to EDS on  PASARR number received from EDS on   FL2 transmitted to all facilities in geographic area requested by pt/family on  10/23/2012 FL2 transmitted to all facilities within larger geographic area on   Patient informed that his/her managed care company has contracts with or will negotiate with  certain facilities, including the following:     Patient/family informed of bed offers received:  10/23/2012 Patient chooses bed at  Physician recommends and patient chooses bed at    Patient to be transferred to  on   Patient to be transferred to facility by   The following physician request were entered in Epic:   Additional Comments:   Roddie Mc, Bryon Lions, [phone]

## 2012-10-23 NOTE — Progress Notes (Signed)
Pt with dribbling of urine.   Attempts at catheter placement unsuccessful despite smaller and more firm catheter.  Still having dribbling of urine but passing some.  Monitor for now but may need urology to see for further evaluation.  Would not attempt any further attempts for now.  May need urology to see later in am.

## 2012-10-23 NOTE — Consult Note (Signed)
Urology Consult   Physician requesting consult: Janee Morn  Reason for consult: Acute urinary retention  History of Present Illness: Craig Martin is a 79 y.o. male with PMH significant for glaucoma, BPH s/p TURP, DM, CAD, and HTN who recently underwent excision of a melanoma on his right foot.  Postoperatively he developed short runs of VT.  He is being followed by general surgery and cardiology at this time.  Last night the pt began to have difficulty voiding with only being able to "dribble" despite need to void.  Several unsuccessful attempts were made to place a cath by nursing.  The pt has continued to dribble and is leaking some urine.  This morning a PVR revealed >900cc.  No UA has been checked this admission.  Pt has a long hx of urinary issues and known BPH.  He underwent a TURP by Dr. Berneice Heinrich in Jan 2014.  Pt states since that time he has done very well with no voiding issues until last night.  He is currently resting and his only complaint is of lower abdominal discomfort and the need to void.  He denies HA, F/C, CP, SOB, N/V, and diarrhea/constipation.      Past Medical History  Diagnosis Date  . Glaucoma   . Bradycardia   . Diabetes mellitus   . Coronary artery disease   . Hyperlipidemia   . Hypertension   . Shortness of breath     WITH EXERTION  . Stroke     "I had a stroke many yrs ago" -no residual problems  . BPH (benign prostatic hyperplasia)   . Difficulty voiding   . Dysrhythmia     AFIB  - TAKES PRADAXA FOLLOWED BY DR. Allyson Sabal  . Critical lower limb ischemia   . Melanoma   . Arthritis     Past Surgical History  Procedure Laterality Date  . Stented coronary artery  2004  . Transurethral resection of prostate  01/24/2012    Procedure: TRANSURETHRAL RESECTION OF THE PROSTATE WITH GYRUS INSTRUMENTS;  Surgeon: Sebastian Ache, MD;  Location: [facility];  Service: Urology;  Laterality: N/A;  . Cardiac catheterization  09/19/2002    moderate LAD and RCA and normal LV  function  . Nm myocar perf wall motion  05/24/2011    showed inferior scar w/o ischemia  . Doppler echocardiography  05/24/2011    EF >55%  . Doppler echocardiography  09/28/2009    EF >55% lvh w/ left atrial enlargement,diastolic dysfunctio,elevated lv filling pressure  . Nm myocar perf wall motion  09/09/2009    mild ischemia mid anterior,apical anterior and basal inferolateral  . Nm myocar perf wall motion  12/04/2002  . Nm myocar perf wall motion  05/21/2002  . Cardiac catheterization  07/03/2002  . Cataract extraction      Current Hospital Medications:  Home Meds:    Medication List         aspirin EC 81 MG tablet  Take 81 mg by mouth daily.     dabigatran 75 MG Caps capsule  Commonly known as:  PRADAXA  Take by mouth every 12 (twelve) hours.     furosemide 20 MG tablet  Commonly known as:  LASIX  Take 20 mg by mouth daily.     linagliptin 5 MG Tabs tablet  Commonly known as:  TRADJENTA  Take 5 mg by mouth daily.     losartan 25 MG tablet  Commonly known as:  COZAAR  Take 25 mg by mouth daily.  LUMIGAN 0.01 % Soln  Generic drug:  bimatoprost  Place 1 drop into both eyes at bedtime.     oxyCODONE-acetaminophen 5-325 MG per tablet  Commonly known as:  PERCOCET/ROXICET  Take 1-2 tablets by mouth every 6 (six) hours as needed for pain.     timolol 0.5 % ophthalmic solution  Commonly known as:  TIMOPTIC  Place 1 drop into both eyes daily.        Scheduled Meds: . Chlorhexidine Gluconate Cloth  6 each Topical Daily  . furosemide  20 mg Oral Daily  . heparin  5,000 Units Subcutaneous Q8H  . insulin aspart  0-15 Units Subcutaneous TID WC  . linagliptin  5 mg Oral Daily  . losartan  25 mg Oral Daily  . mupirocin ointment  1 application Nasal BID  . potassium chloride  40 mEq Oral BID  . tamsulosin  0.4 mg Oral QPC supper  . timolol  1 drop Both Eyes Daily   Continuous Infusions:  PRN Meds:.morphine injection, ondansetron (ZOFRAN) IV, ondansetron,  oxyCODONE-acetaminophen  Allergies: No Known Allergies  History reviewed. No pertinent family history.  Social History:  reports that he quit smoking about 26 years ago. His smoking use included Cigarettes. He smoked 1.50 packs per day. He has quit using smokeless tobacco. He reports that he does not drink alcohol or use illicit drugs.  ROS: A complete review of systems was performed.  All systems are negative except for pertinent findings as noted.  Physical Exam:  Vital signs in last 24 hours: Temp:  [98 F (36.7 C)-98.1 F (36.7 C)] 98.1 F (36.7 C) (10/22 0538) Pulse Rate:  [73-85] 77 (10/22 0538) Resp:  [18-20] 20 (10/22 0538) BP: (153-159)/(77-86) 159/86 mmHg (10/22 0538) SpO2:  [100 %] 100 % (10/22 0538) Weight:  [83.1 kg (183 lb 3.2 oz)] 83.1 kg (183 lb 3.2 oz) (10/22 0538) Constitutional:  Alert and oriented, No acute distress Cardiovascular: irr Respiratory: Normal respiratory effort GI: Abdomen is soft, nontender, no abdominal masses; he is firm and distended in the suprapubic area GU: uncirc penis with no lesions; small amount of urine noted at meatus Lymphatic: No lymphadenopathy Neurologic: Grossly intact, no focal deficits Psychiatric: Normal mood and affect  Laboratory Data:  Lab Results  Component Value Date   CREATININE 2.73* 10/23/2012   Lab Results  Component Value Date   WBC 5.2 10/14/2012   HGB 10.9* 10/14/2012   HCT 32.4* 10/14/2012   MCV 89.3 10/14/2012   PLT 194 10/14/2012     Recent Labs  10/21/12 2100 10/23/12 0732  NA 137 135  K 3.2* 4.6  CL 101 100  GLUCOSE 166* 144*  BUN 25* 26*  CALCIUM 8.7 8.7  CREATININE 1.61* 2.73*     Results for orders placed during the hospital encounter of 10/21/12 (from the past 24 hour(s))  GLUCOSE, CAPILLARY     Status: Abnormal   Collection Time    10/22/12 11:56 AM      Result Value Range   Glucose-Capillary 147 (*) 70 - 99 mg/dL   Comment 1 Notify RN    GLUCOSE, CAPILLARY     Status:  Abnormal   Collection Time    10/22/12  4:56 PM      Result Value Range   Glucose-Capillary 152 (*) 70 - 99 mg/dL   Comment 1 Notify RN    GLUCOSE, CAPILLARY     Status: Abnormal   Collection Time    10/22/12 10:19 PM  Result Value Range   Glucose-Capillary 107 (*) 70 - 99 mg/dL   Comment 1 Notify RN     Comment 2 Documented in Chart    GLUCOSE, CAPILLARY     Status: Abnormal   Collection Time    10/23/12  7:30 AM      Result Value Range   Glucose-Capillary 145 (*) 70 - 99 mg/dL  TROPONIN I     Status: None   Collection Time    10/23/12  7:32 AM      Result Value Range   Troponin I <0.30  <0.30 ng/mL  MAGNESIUM     Status: None   Collection Time    10/23/12  7:32 AM      Result Value Range   Magnesium 2.1  1.5 - 2.5 mg/dL  BASIC METABOLIC PANEL     Status: Abnormal   Collection Time    10/23/12  7:32 AM      Result Value Range   Sodium 135  135 - 145 mEq/L   Potassium 4.6  3.5 - 5.1 mEq/L   Chloride 100  96 - 112 mEq/L   CO2 23  19 - 32 mEq/L   Glucose, Bld 144 (*) 70 - 99 mg/dL   BUN 26 (*) 6 - 23 mg/dL   Creatinine, Ser 0.45 (*) 0.50 - 1.35 mg/dL   Calcium 8.7  8.4 - 40.9 mg/dL   GFR calc non Af Amer 20 (*) >90 mL/min   GFR calc Af Amer 23 (*) >90 mL/min   No results found for this or any previous visit (from the past 240 hour(s)).  Renal Function:  Recent Labs  10/21/12 2100 10/23/12 0732  CREATININE 1.61* 2.73*   Estimated Creatinine Clearance: 20.1 ml/min (by C-G formula based on Cr of 2.73).  Radiologic Imaging: No results found.  Procedure: pt was prepped and draped in a sterile fashion with betadine.  Several attempts to place an 18 coude cath were unsuccessful.  Cath could be hubbed but no urine return noted.  No resistance noted. Cath removed.  Pt tolerated well.  Dr. Marcine Matar been consulted on the patient. After verbal consent, I prepped and draped the patient's genitalia. A 16 French flexible cystoscope was used to negotiate the  urethra. There was a posterior false passage. I negotiated the cystoscope into the bladder. There was a minimal bladder neck contracture. Bladder was distended, no mucosal lesions were noted. I placed a sensor-tip guidewire, and remove the scope. Over top of the guidewire an 54 [place] council tip catheter was passed. Clear urine was obtained. 10 cc of water were placed in the balloon. This was hooked to dependent drainage. The patient tolerated the procedure well.  Impression/Recommendation:  Acute post op urinary retention-- pt may have false passage as cath could be easily passed but no urine obtained.  Dr. Retta Diones is going to perform a bedside cysto to eval pt and place catheter.  Check UA to eval for UTI and send for culture if positive.    I would recommend that the patient be ambulatory prior to catheter removal. He can followup as scheduled in our office.  Silas Flood 10/23/2012, 11:18 AM

## 2012-10-24 ENCOUNTER — Encounter (HOSPITAL_COMMUNITY): Payer: Self-pay | Admitting: General Surgery

## 2012-10-24 ENCOUNTER — Encounter (HOSPITAL_COMMUNITY): Payer: Self-pay | Admitting: *Deleted

## 2012-10-24 ENCOUNTER — Other Ambulatory Visit: Payer: Self-pay | Admitting: Cardiology

## 2012-10-24 DIAGNOSIS — I472 Ventricular tachycardia: Secondary | ICD-10-CM

## 2012-10-24 DIAGNOSIS — I10 Essential (primary) hypertension: Secondary | ICD-10-CM

## 2012-10-24 DIAGNOSIS — I2510 Atherosclerotic heart disease of native coronary artery without angina pectoris: Secondary | ICD-10-CM

## 2012-10-24 DIAGNOSIS — C439 Malignant melanoma of skin, unspecified: Secondary | ICD-10-CM

## 2012-10-24 LAB — GLUCOSE, CAPILLARY
Glucose-Capillary: 99 mg/dL (ref 70–99)
Glucose-Capillary: 153 mg/dL — ABNORMAL HIGH (ref 70–99)

## 2012-10-24 MED ORDER — DSS 100 MG PO CAPS: 100.0000 mg | ORAL_CAPSULE | Freq: Two times a day (BID) | ORAL | Status: DC

## 2012-10-24 MED ORDER — HEART RATE MONITOR MISC: 1.0000 | Freq: Every day | Status: DC

## 2012-10-24 MED ORDER — METOPROLOL TARTRATE 12.5 MG HALF TABLET: 12.5000 mg | ORAL_TABLET | Freq: Two times a day (BID) | ORAL | Status: DC

## 2012-10-24 MED ORDER — OXYCODONE-ACETAMINOPHEN 5-325 MG PO TABS: 1.0000 | ORAL_TABLET | Freq: Four times a day (QID) | ORAL | Status: DC | PRN

## 2012-10-24 MED ORDER — MAGNESIUM HYDROXIDE 400 MG/5ML PO SUSP
15.0000 mL | Freq: Every day | ORAL | Status: DC
  Administered 2012-10-24: 15 mL via ORAL
  Filled 2012-10-24: qty 30

## 2012-10-24 NOTE — Progress Notes (Signed)
Patient discharged to [facility], report given to nurse Rubie Maid.

## 2012-10-24 NOTE — Progress Notes (Signed)
Physical Therapy Treatment Patient Details Name: Clarion Mooneyhan MRN: [id number] DOB: 06-Dec-1926 Today's Date: 10/24/2012 Time: 2130-8657 PT Time Calculation (min): 25 min  PT Assessment / Plan / Recommendation  History of Present Illness Patient underwent wide excision melanoma right foot with a-cell coverage in conjunction with Dr. Kelly Splinter.In addition, he underwent right inguinal lymph node biopsy.   PT Comments   Pt continues with difficulty maintaining NWBing on R LE and often drags foot behind him when trying to mobilize.  Pt wished to remain on toilet when PT left.  Pt with pull cord and RN made aware.    Follow Up Recommendations  SNF     Does the patient have the potential to tolerate intense rehabilitation     Barriers to Discharge        Equipment Recommendations  Rolling walker with 5" wheels;Wheelchair (measurements PT)    Recommendations for Other Services    Frequency Min 5X/week   Progress towards PT Goals Progress towards PT goals: Progressing toward goals  Plan Current plan remains appropriate    Precautions / Restrictions Precautions Precautions: Fall Required Braces or Orthoses: Other Brace/Splint Other Brace/Splint: CAM boot rt foot. Restrictions RLE Weight Bearing: Non weight bearing   Pertinent Vitals/Pain Denied much pain.      Mobility  Bed Mobility Bed Mobility: Supine to Sit;Sitting - Scoot to Edge of Bed Supine to Sit: 5: Supervision;With rails;HOB elevated Sitting - Scoot to Edge of Bed: 5: Supervision Transfers Transfers: Sit to Stand;Stand to Sit Sit to Stand: 3: Mod assist;With upper extremity assist;From bed Stand to Sit: 4: Min assist;With upper extremity assist;To toilet Details for Transfer Assistance: pt with difficulty maintaining NWBing on R LE.  pt required increased A to come to stand today.   Ambulation/Gait Ambulation/Gait Assistance: 4: Min assist Ambulation Distance (Feet): 10 Feet (x2) Assistive device: Rolling  walker Ambulation/Gait Assistance Details: pt tends to keep R LE more in TDWBing and has difficulty with NWBing.   Gait Pattern: Step-to pattern Stairs: No Wheelchair Mobility Wheelchair Mobility: No    Exercises     PT Diagnosis:    PT Problem List:   PT Treatment Interventions:     PT Goals (current goals can now be found in the care plan section) Acute Rehab PT Goals Patient Stated Goal: Eventually return home  Time For Goal Achievement: 10/29/12 Potential to Achieve Goals: Good  Visit Information  Last PT Received On: 10/24/12 Assistance Needed: +1 History of Present Illness: Patient underwent wide excision melanoma right foot with a-cell coverage in conjunction with Dr. Kelly Splinter.In addition, he underwent right inguinal lymph node biopsy.    Subjective Data  Patient Stated Goal: Eventually return home    Cognition  Cognition Arousal/Alertness: Awake/alert Behavior During Therapy: WFL for tasks assessed/performed Overall Cognitive Status: Within Functional Limits for tasks assessed    Balance  Balance Balance Assessed: Yes Static Standing Balance Static Standing - Balance Support: Bilateral upper extremity supported Static Standing - Level of Assistance: 4: Min assist  End of Session PT - End of Session Equipment Utilized During Treatment: Gait belt Activity Tolerance: Patient limited by fatigue Patient left: in chair;with call bell/phone within reach Nurse Communication: Mobility status   GP Functional Assessment Tool Used: clinical judgement Functional Limitation: Mobility: Walking and moving around Mobility: Walking and Moving Around Current Status (Q4696): At least 20 percent but less than 40 percent impaired, limited or restricted Mobility: Walking and Moving Around Goal Status [phone]): 0 percent impaired, limited or restricted   Galileah Piggee,  Alison Murray, [place] [phone] 10/24/2012, 3:04 PM

## 2012-10-24 NOTE — Progress Notes (Signed)
Subjective:  No cardiac complaints  Objective:  Temp:  [97.9 F (36.6 C)-99.1 F (37.3 C)] 98.2 F (36.8 C) (10/23 0510) Pulse Rate:  [73-107] 73 (10/23 0510) Resp:  [16-18] 16 (10/23 0510) BP: (104-122)/(49-66) 122/66 mmHg (10/23 0510) SpO2:  [100 %] 100 % (10/23 0510) Weight change:   Intake/Output from previous day: 10/22 0701 - 10/23 0700 In: 875.3 [I.V.:875.3] Out: 2550 [Urine:2550]  Intake/Output from this shift: Total I/O In: -  Out: 950 [Urine:950]  Medications: Current Facility-Administered Medications  Medication Dose Route Frequency Provider Last Rate Last Dose  . Chlorhexidine Gluconate Cloth 2 % PADS 6 each  6 each Topical Daily Liz Malady, MD   6 each at 10/24/12 1033  . docusate sodium (COLACE) capsule 100 mg  100 mg Oral BID Emelia Loron, MD   100 mg at 10/24/12 1033  . furosemide (LASIX) tablet 20 mg  20 mg Oral Daily Liz Malady, MD   20 mg at 10/24/12 1032  . heparin injection 5,000 Units  5,000 Units Subcutaneous Q8H Liz Malady, MD   5,000 Units at 10/24/12 [phone]  . insulin aspart (novoLOG) injection 0-15 Units  0-15 Units Subcutaneous TID WC Atilano Ina, MD   3 Units at 10/24/12 1247  . linagliptin (TRADJENTA) tablet 5 mg  5 mg Oral Daily Liz Malady, MD   5 mg at 10/24/12 1032  . losartan (COZAAR) tablet 25 mg  25 mg Oral Daily Liz Malady, MD   25 mg at 10/24/12 1032  . magnesium hydroxide (MILK OF MAGNESIA) suspension 15 mL  15 mL Oral Daily Liz Malady, MD   15 mL at 10/24/12 1031  . metoprolol tartrate (LOPRESSOR) tablet 12.5 mg  12.5 mg Oral BID Chrystie Nose, MD   12.5 mg at 10/24/12 1032  . morphine 2 MG/ML injection 2-4 mg  2-4 mg Intravenous Q2H PRN Liz Malady, MD   2 mg at 10/21/12 2303  . mupirocin ointment (BACTROBAN) 2 % 1 application  1 application Nasal BID Liz Malady, MD   1 application at 10/24/12 1033  . ondansetron (ZOFRAN) tablet 4 mg  4 mg Oral Q6H PRN Liz Malady, MD         Or  . ondansetron [facility]) injection 4 mg  4 mg Intravenous Q6H PRN Liz Malady, MD      . oxyCODONE-acetaminophen (PERCOCET/ROXICET) 5-325 MG per tablet 1-2 tablet  1-2 tablet Oral Q4H PRN Liz Malady, MD   2 tablet at 10/21/12 2005  . potassium chloride SA (K-DUR,KLOR-CON) CR tablet 40 mEq  40 mEq Oral BID Atilano Ina, MD   40 mEq at 10/24/12 1032  . tamsulosin (FLOMAX) capsule 0.4 mg  0.4 mg Oral QPC supper Liz Malady, MD   0.4 mg at 10/23/12 1728  . timolol (TIMOPTIC) 0.5 % ophthalmic solution 1 drop  1 drop Both Eyes Daily Liz Malady, MD   1 drop at 10/24/12 1034    Physical Exam: Alert, comfortable Skin:Warm and dry, brisk capillary refill  HEENT:normocephalic, sclera clear, mucus membranes moist  Neck:supple, no JVD, no bruits  Heart:irregular without murmur, gallup, rub or click  Lungs:clear ant. without rales, rhonchi, or wheezes  RUE:AVWU, non tender, + BS, do not palpate liver spleen or masses  Ext:no lower ext edema, Rt. Lower ext in boot, 2+ radial pulses  Neuro:alert and oriented, MAE, follows commands, + facial symmetry   Lab Results: Results for orders  placed during the hospital encounter of 10/21/12 (from the past 48 hour(s))  GLUCOSE, CAPILLARY     Status: Abnormal   Collection Time    10/22/12  4:56 PM      Result Value Range   Glucose-Capillary 152 (*) 70 - 99 mg/dL   Comment 1 Notify RN    GLUCOSE, CAPILLARY     Status: Abnormal   Collection Time    10/22/12 10:19 PM      Result Value Range   Glucose-Capillary 107 (*) 70 - 99 mg/dL   Comment 1 Notify RN     Comment 2 Documented in Chart    GLUCOSE, CAPILLARY     Status: Abnormal   Collection Time    10/23/12  7:30 AM      Result Value Range   Glucose-Capillary 145 (*) 70 - 99 mg/dL  TROPONIN I     Status: None   Collection Time    10/23/12  7:32 AM      Result Value Range   Troponin I <0.30  <0.30 ng/mL   Comment:            Due to the release kinetics of cTnI,     a  negative result within the first hours     of the onset of symptoms does not rule out     myocardial infarction with certainty.     If myocardial infarction is still suspected,     repeat the test at appropriate intervals.  MAGNESIUM     Status: None   Collection Time    10/23/12  7:32 AM      Result Value Range   Magnesium 2.1  1.5 - 2.5 mg/dL  BASIC METABOLIC PANEL     Status: Abnormal   Collection Time    10/23/12  7:32 AM      Result Value Range   Sodium 135  135 - 145 mEq/L   Potassium 4.6  3.5 - 5.1 mEq/L   Chloride 100  96 - 112 mEq/L   CO2 23  19 - 32 mEq/L   Glucose, Bld 144 (*) 70 - 99 mg/dL   BUN 26 (*) 6 - 23 mg/dL   Creatinine, Ser 1.61 (*) 0.50 - 1.35 mg/dL   Comment: REPEATED TO VERIFY   Calcium 8.7  8.4 - 10.5 mg/dL   GFR calc non Af Amer 20 (*) >90 mL/min   GFR calc Af Amer 23 (*) >90 mL/min   Comment: (NOTE)     The eGFR has been calculated using the CKD EPI equation.     This calculation has not been validated in all clinical situations.     eGFR's persistently <90 mL/min signify possible Chronic Kidney     Disease.  GLUCOSE, CAPILLARY     Status: Abnormal   Collection Time    10/23/12 11:51 AM      Result Value Range   Glucose-Capillary 142 (*) 70 - 99 mg/dL  URINALYSIS, ROUTINE W REFLEX MICROSCOPIC     Status: Abnormal   Collection Time    10/23/12 12:28 PM      Result Value Range   Color, Urine YELLOW  YELLOW   APPearance CLEAR  CLEAR   Specific Gravity, Urine 1.008  1.005 - 1.030   pH 5.5  5.0 - 8.0   Glucose, UA NEGATIVE  NEGATIVE mg/dL   Hgb urine dipstick LARGE (*) NEGATIVE   Bilirubin Urine NEGATIVE  NEGATIVE   Ketones, ur NEGATIVE  NEGATIVE mg/dL  Protein, ur NEGATIVE  NEGATIVE mg/dL   Urobilinogen, UA 0.2  0.0 - 1.0 mg/dL   Nitrite NEGATIVE  NEGATIVE   Leukocytes, UA NEGATIVE  NEGATIVE  URINE MICROSCOPIC-ADD ON     Status: None   Collection Time    10/23/12 12:28 PM      Result Value Range   WBC, UA 0-2  <3 WBC/hpf   RBC / HPF  21-50  <3 RBC/hpf  GLUCOSE, CAPILLARY     Status: Abnormal   Collection Time    10/23/12  5:10 PM      Result Value Range   Glucose-Capillary 126 (*) 70 - 99 mg/dL  GLUCOSE, CAPILLARY     Status: Abnormal   Collection Time    10/23/12 10:25 PM      Result Value Range   Glucose-Capillary 171 (*) 70 - 99 mg/dL   Comment 1 Notify RN     Comment 2 Documented in Chart    GLUCOSE, CAPILLARY     Status: None   Collection Time    10/24/12  7:55 AM      Result Value Range   Glucose-Capillary 99  70 - 99 mg/dL  GLUCOSE, CAPILLARY     Status: Abnormal   Collection Time    10/24/12 12:01 PM      Result Value Range   Glucose-Capillary 153 (*) 70 - 99 mg/dL   Comment 1 Notify RN      Imaging: Imaging results have been reviewed and No results found.  Assessment:  1. Principal Problem: 2.   Malignant melanoma of right foot 3. Active Problems: 4.   Coronary artery disease 5.   Critical lower limb ischemia 6.   Essential hypertension 7.   Hyperlipidemia 8.   Diabetes 9.   Chronic atrial fibrillation 10.   NSVT (nonsustained ventricular tachycardia) 11.   Plan:  1. Arrhythmia has settled down - no further episodes of wide complex tachycardia. Rare PVCs. K now normal and suspect hypokalemia was cause of increased arrhythmia. Echo confirms previous report of normal LV systolic function.  2. Will go ahead with plan for outpatient workup with 14 day event monitor and nuclear perfusion study  Length of Stay:  LOS: 3 days    Craig Martin 10/24/2012, 12:56 PM

## 2012-10-24 NOTE — Discharge Summary (Signed)
Physician Discharge Summary  Patient ID: Craig Martin MRN: [id number] DOB/AGE: [birth year] 79 y.o.  Admit date: 10/21/2012 Discharge date: 10/24/2012  Admission Diagnoses:Malignant melanoma right foot, right inguinal lymphadenopathy  Discharge Diagnoses: Malignant melanoma right foot, right inguinal lymphadenopathy Principal Problem:   Malignant melanoma of right foot Active Problems:   Coronary artery disease   Critical lower limb ischemia   Essential hypertension   Hyperlipidemia   Diabetes   Chronic atrial fibrillation   NSVT (nonsustained ventricular tachycardia)   Discharged Condition: good  Hospital Course: Patient was admitted and underwent  Right inguinal lymph node biopsy and wide excision of melanoma right foot followed by placement of A-cell by Dr. Kelly Splinter. Postoperatively he had some ventricular ectopy and was seen and cleared by cardiology. They started Lopressor daily. He also had urinary retention and was seen by urology. Foley catheter was placed. Foley catheter needs to be continued until he is much more ambulatory. He will need to followup with Dr. Retta Diones at [facility] urology after he goes home. He had good pain control. He worked with physical therapy. Instructions per Dr. Kelly Splinter are nonweightbearing right lower extremity and to wear the boot. He has deconditioning and physical therapy recommend rehabilitation at skilled nursing facility. He has a bed offer today and is family is in agreement. He is being discharged to [facility]. Dressing orders are as follows: No dressing needed right groin. Right foot: Daily remove boot, remove gauze and Kerlix, apply surgical lubricant over Adaptic, replace gauze and Kerlix, replace boot.  Consults: Dr. Rennis Golden - Cardiology. Dr. Retta Diones - Urology  Significant Diagnostic Studies: none  Treatments: surgery: above  Discharge Exam: Blood pressure 122/66, pulse 73, temperature 98.2 F (36.8 C), temperature source Oral,  resp. rate 16, height 5\' 9"  (1.753 m), weight 83.1 kg (183 lb 3.2 oz), SpO2 100.00%. see note from today  Disposition: skilled nursing facility  Discharge Orders   Future Appointments Provider Department Dept Phone   11/20/2012 9:15 AM Liz Malady, MD [facility], [place] [phone]   Future Orders Complete By Expires   Diet - low sodium heart healthy  As directed    Diet - low sodium heart healthy  As directed    Discharge instructions  As directed    Comments:     Continue wearing the boot right lower extremity. Followup with Dr. Kelly Splinter this week. My nurse will call you regarding an appointment in my office as well.   Discharge wound care:  As directed    Comments:     Leave dressing in place until you see Dr. Kelly Splinter   Discharge wound care:  As directed    Comments:     No dressing needed right groin. Right foot dressing daily : remove boot, remove gauze and kerlix, apply surgical lubricant over adaptic, replace gauze and kerlix, replace boot.   Increase activity slowly  As directed        Medication List         aspirin EC 81 MG tablet  Take 81 mg by mouth daily.     dabigatran 75 MG Caps capsule  Commonly known as:  PRADAXA  Take by mouth every 12 (twelve) hours.     DSS 100 MG Caps  Take 100 mg by mouth 2 (two) times daily.     furosemide 20 MG tablet  Commonly known as:  LASIX  Take 20 mg by mouth daily.     linagliptin 5 MG Tabs tablet  Commonly known as:  TRADJENTA  Take 5 mg by mouth daily.     losartan 25 MG tablet  Commonly known as:  COZAAR  Take 25 mg by mouth daily.     LUMIGAN 0.01 % Soln  Generic drug:  bimatoprost  Place 1 drop into both eyes at bedtime.     metoprolol tartrate 12.5 mg Tabs tablet  Commonly known as:  LOPRESSOR  Take 0.5 tablets (12.5 mg total) by mouth 2 (two) times daily.     oxyCODONE-acetaminophen 5-325 MG per tablet  Commonly known as:  PERCOCET/ROXICET  Take 1-2 tablets by mouth every 6 (six) hours as  needed for pain.     timolol 0.5 % ophthalmic solution  Commonly known as:  TIMOPTIC  Place 1 drop into both eyes daily.           Follow-up Information   Follow up with [facility], DO. Schedule an appointment as soon as possible for a visit in 1 week. (see Dr. Kelly Splinter next week)    Specialty:  Plastic Surgery   Contact information:   [address] [place] [address] [phone]       Follow up with Liz Malady, MD. Schedule an appointment as soon as possible for a visit in 3 weeks.   Specialty:  General Surgery   Contact information:   [address] [place] [address] [phone]      * Please note for followup with Dr. Kelly Splinter called the wound center [phone] for an appointment Monday morning 10/27. *   SignedVioleta Gelinas E 10/24/2012, 10:38 AM

## 2012-10-24 NOTE — Progress Notes (Signed)
3 Days Post-Op  Subjective: Has not had a BM in several days. No other complaints.  Objective: Vital signs in last 24 hours: Temp:  [97.9 F (36.6 C)-99.1 F (37.3 C)] 98.2 F (36.8 C) (10/23 0510) Pulse Rate:  [73-107] 73 (10/23 0510) Resp:  [16-18] 16 (10/23 0510) BP: (104-122)/(49-66) 122/66 mmHg (10/23 0510) SpO2:  [100 %] 100 % (10/23 0510) Last BM Date: 10/18/12  Intake/Output from previous day: 10/22 0701 - 10/23 0700 In: 875.3 [I.V.:875.3] Out: 2550 [Urine:2550] Intake/Output this shift: Total I/O In: -  Out: 950 [Urine:950]  General appearance: alert Resp: clear to auscultation bilaterally Cardio: irregularly irregular rhythm GI: soft, NT R groin with some swelling under incision, R foot boot on and plastics dressing in place  Lab Results:  No results found for this basename: WBC, HGB, HCT, PLT,  in the last 72 hours BMET  Recent Labs  10/21/12 2100 10/23/12 0732  NA 137 135  K 3.2* 4.6  CL 101 100  CO2 22 23  GLUCOSE 166* 144*  BUN 25* 26*  CREATININE 1.61* 2.73*  CALCIUM 8.7 8.7   PT/INR No results found for this basename: LABPROT, INR,  in the last 72 hours ABG No results found for this basename: PHART, PCO2, PO2, HCO3,  in the last 72 hours  Studies/Results: No results found.  Anti-infectives: Anti-infectives   Start     Dose/Rate Route Frequency Ordered Stop   10/21/12 1418  polymyxin B 500,000 Units, bacitracin 50,000 Units in sodium chloride irrigation 0.9 % 500 mL irrigation  Status:  Discontinued       As needed 10/21/12 1418 10/21/12 1421   10/21/12 0600  ceFAZolin (ANCEF) IVPB 2 g/50 mL premix     2 g 100 mL/hr over 30 Minutes Intravenous On call to O.R. 10/20/12 1547 10/21/12 1246      Assessment/Plan: s/p Procedure(s): MELANOMA EXCISION (Right) INGUINAL LYMPH NODE BIOPSY (Right) APPLICATION OF A-CELL OF EXTREMITY (Right) POD#3 Urinary retention - foley placed by urology - they plan outpatient F/U and continue foley until  more ambulatory Deconditioning - NWB RLE per Dr. Kelly Splinter. PT eval rec SNF. Awaiting bed offers. VT - appreciate cardiology eval. Echo done VTE - heparin  LOS: 3 days    Chinita Schimpf E 10/24/2012

## 2012-10-24 NOTE — Progress Notes (Signed)
Chaplain provided a ministry of presence and emotional support for the patient. Patient said "he does not want to go to rehab, but while there he will focus on his healing and walking." Patient said "his only support system is his brother." Chaplain provided spiritual support through prayer.  10/24/12 1100  Clinical Encounter Type  Visited With Patient  Visit Type Initial;Spiritual support;Social support  Spiritual Encounters  Spiritual Needs Prayer;Emotional  Stress Factors  Patient Stress Factors Health changes

## 2012-10-31 NOTE — Progress Notes (Signed)
Pt had arrythmia's in hospital-cardiology sch a stress myo for him 11/07/12-anesthesia said surgery needed to be r/s after  Stress test-dr sangers office notified.

## 2012-11-07 ENCOUNTER — Encounter (HOSPITAL_COMMUNITY): Payer: Medicare Other

## 2012-11-12 ENCOUNTER — Telehealth (HOSPITAL_COMMUNITY): Payer: Self-pay | Admitting: *Deleted

## 2012-11-14 ENCOUNTER — Telehealth (HOSPITAL_COMMUNITY): Payer: Self-pay | Admitting: *Deleted

## 2012-11-18 ENCOUNTER — Telehealth: Payer: Self-pay | Admitting: Cardiovascular Disease

## 2012-11-18 NOTE — Telephone Encounter (Signed)
I'm not sure I understand how the question relates to his surgery. He was supposed to have an outpatient event monitor and nuclear stress test as part of workup for nonsustained ventricular tachycardia during his previous hospitalization. I don't think that recommendation has changed. His surgery should not be delayed for this workup, but it should be done.

## 2012-11-18 NOTE — Telephone Encounter (Signed)
Returned call to [place] and informed per Dr. Royann Shivers.  Also informed message in Epic and she should be able to view his response.  Verbalized understanding.

## 2012-11-18 NOTE — Progress Notes (Signed)
Ok to do surgery per cardiology-pt was not cancelled this wed-just asked if cleared.

## 2012-11-18 NOTE — Telephone Encounter (Signed)
Pt was cleared for surgery on 10-20. After that he saw Dr C on 10-23 and Dr C wanted him to have Stress Test and Monitor-pt no showed for both of them. Pt is now cancelled for skin graft on 11-20(Thursday).Will this be all right with Dr C since he did not get the test and monitor?

## 2012-11-18 NOTE — Progress Notes (Signed)
Called cardiology-pt did not show for stress test and monitor-called to see if he could be cleared for surgery 11/20/12

## 2012-11-18 NOTE — Telephone Encounter (Signed)
Message forwarded to Dr. Croitoru/Barbara, CMA 

## 2012-11-20 ENCOUNTER — Ambulatory Visit (HOSPITAL_BASED_OUTPATIENT_CLINIC_OR_DEPARTMENT_OTHER): Admission: RE | Admit: 2012-11-20 | Payer: Medicare Other | Source: Ambulatory Visit | Admitting: Plastic Surgery

## 2012-11-20 ENCOUNTER — Encounter (HOSPITAL_BASED_OUTPATIENT_CLINIC_OR_DEPARTMENT_OTHER): Admission: RE | Payer: Self-pay | Source: Ambulatory Visit

## 2012-11-20 ENCOUNTER — Other Ambulatory Visit: Payer: Self-pay | Admitting: Plastic Surgery

## 2012-11-20 ENCOUNTER — Encounter (INDEPENDENT_AMBULATORY_CARE_PROVIDER_SITE_OTHER): Payer: Self-pay | Admitting: General Surgery

## 2012-11-20 ENCOUNTER — Ambulatory Visit (INDEPENDENT_AMBULATORY_CARE_PROVIDER_SITE_OTHER): Payer: Medicare Other | Admitting: General Surgery

## 2012-11-20 VITALS — BP 138/78 | HR 60 | Temp 97.0°F | Resp 18

## 2012-11-20 DIAGNOSIS — IMO0002 Reserved for concepts with insufficient information to code with codable children: Secondary | ICD-10-CM | POA: Insufficient documentation

## 2012-11-20 DIAGNOSIS — T792XXA Traumatic secondary and recurrent hemorrhage and seroma, initial encounter: Secondary | ICD-10-CM

## 2012-11-20 DIAGNOSIS — C439 Malignant melanoma of skin, unspecified: Secondary | ICD-10-CM

## 2012-11-20 SURGERY — APPLICATION, GRAFT, SKIN, SPLIT-THICKNESS
Anesthesia: General | Site: Foot | Laterality: Right

## 2012-11-20 NOTE — H&P (Signed)
Craig Martin is an 79 y.o. male.   Chief Complaint: right foot wound HPI: The patient is an 79 yrs old bm that underwent excision of a melanoma on his right foot.  Fortunately the pathology showed clear margins.  Acell was placed and has improved the granulation tissue.  He now presents for some debridement and more acell placement in order to fill in the defect prior to grafting.  Past Medical History  Diagnosis Date  . Glaucoma   . Bradycardia   . Diabetes mellitus   . Coronary artery disease   . Hyperlipidemia   . Hypertension   . Shortness of breath     WITH EXERTION  . Stroke     "I had a stroke many yrs ago" -no residual problems  . BPH (benign prostatic hyperplasia)   . Difficulty voiding   . Dysrhythmia     AFIB  - TAKES PRADAXA FOLLOWED BY DR. Allyson Sabal  . Critical lower limb ischemia   . Melanoma   . Arthritis     Past Surgical History  Procedure Laterality Date  . Stented coronary artery  2004  . Transurethral resection of prostate  01/24/2012    Procedure: TRANSURETHRAL RESECTION OF THE PROSTATE WITH GYRUS INSTRUMENTS;  Surgeon: Sebastian Ache, MD;  Location: [facility] ORS;  Service: Urology;  Laterality: N/A;  . Cardiac catheterization  09/19/2002    moderate LAD and RCA and normal LV function  . Nm myocar perf wall motion  05/24/2011    showed inferior scar w/o ischemia  . Doppler echocardiography  05/24/2011    EF >55%  . Doppler echocardiography  09/28/2009    EF >55% lvh w/ left atrial enlargement,diastolic dysfunctio,elevated lv filling pressure  . Nm myocar perf wall motion  09/09/2009    mild ischemia mid anterior,apical anterior and basal inferolateral  . Nm myocar perf wall motion  12/04/2002  . Nm myocar perf wall motion  05/21/2002  . Cardiac catheterization  07/03/2002  . Cataract extraction    . Melanoma excision Right 10/21/2012    Procedure: MELANOMA EXCISION;  Surgeon: Liz Malady, MD;  Location: [facility] OR;  Service: General;  Laterality: Right;  .  Inguinal lymph node biopsy Right 10/21/2012    Procedure: INGUINAL LYMPH NODE BIOPSY;  Surgeon: Liz Malady, MD;  Location: [facility] OR;  Service: General;  Laterality: Right;  . Application of a-cell of extremity Right 10/21/2012    Procedure: APPLICATION OF A-CELL OF EXTREMITY;  Surgeon: Wayland Denis, DO;  Location: [facility] OR;  Service: Plastics;  Laterality: Right;    Family History  Problem Relation Age of Onset  . Diabetes Mother   . Diabetes Brother    Social History:  reports that he quit smoking about 26 years ago. His smoking use included Cigarettes. He smoked 1.50 packs per day. He has quit using smokeless tobacco. He reports that he does not drink alcohol or use illicit drugs.  Allergies: No Known Allergies   (Not in a hospital admission)  No results found for this or any previous visit (from the past 48 hour(s)). No results found.  Review of Systems  Constitutional: Negative.   HENT: Negative.   Eyes: Negative.   Respiratory: Negative.   Cardiovascular: Negative.   Gastrointestinal: Negative.   Genitourinary: Negative.   Musculoskeletal: Negative.   Skin: Negative.   Neurological: Negative.   Psychiatric/Behavioral: Negative.     There were no vitals taken for this visit. Physical Exam  Constitutional: He appears well-developed and well-nourished.  HENT:  Head: Normocephalic and atraumatic.  Eyes: Conjunctivae and EOM are normal. Pupils are equal, round, and reactive to light.  Cardiovascular: Normal rate.   Respiratory: Effort normal.  GI: Soft.  Musculoskeletal:       Feet:  Neurological: He is alert.  Skin: Skin is warm.  Psychiatric: He has a normal mood and affect. His behavior is normal. Judgment and thought content normal.     Assessment/Plan Plan for irrigation and debridement with Acell placement, possible VAC as well.  SANGER,Araeya Lamb 11/20/2012, 8:06 AM

## 2012-11-20 NOTE — Progress Notes (Signed)
Subjective:     Patient ID: Craig Martin, male   DOB: 01-04-1926, 79 y.o.   MRN: [id number]  HPI Patient is status post wide excision melanoma right foot and right inguinal lymph node biopsy. Dr. Kelly Splinter is following him for further wound care on his foot. He's developed some ongoing clear fluid draining from his right groin. He has no significant pain. He is unhappy being nonweightbearing on his right lower extremity but is otherwise doing quite well at the rehabilitation facility.  Review of Systems     Objective:   Physical Exam Right groin wound is nearly healed. There is a small opening laterally which drained clear fluid. This was probed and a clear fluid seroma was evacuated. This was treated locally with silver nitrate and dressing was reapplied.) Maintenance and bruit with plastic surgery dressing in place.    Assessment:     Patient Active Problem List   Diagnosis Date Noted  . Seroma, postoperative 11/20/2012  . NSVT (nonsustained ventricular tachycardia) 10/23/2012  . Inguinal lymphadenopathy 09/25/2012  . Malignant melanoma of right foot 09/25/2012  . Coronary artery disease 08/19/2012  . Critical lower limb ischemia 08/19/2012  . Essential hypertension 08/19/2012  . Hyperlipidemia 08/19/2012  . Diabetes 08/19/2012  . Chronic atrial fibrillation 08/19/2012       Plan:     Doing well status post wide excision stage IIIc melanoma right foot. It is likely he has further inguinal disease. In light of his medical status I do not feel he will tolerate lymphadenectomy completion. His vascular disease is a significant factor with healing his primary excision as well. In light of his wound seroma, this appears uninfected and should improve gradually. I will see him back.

## 2012-11-25 ENCOUNTER — Encounter (HOSPITAL_BASED_OUTPATIENT_CLINIC_OR_DEPARTMENT_OTHER): Payer: Medicare Other | Attending: Plastic Surgery

## 2012-11-26 ENCOUNTER — Telehealth (INDEPENDENT_AMBULATORY_CARE_PROVIDER_SITE_OTHER): Payer: Self-pay

## 2012-11-26 ENCOUNTER — Telehealth (HOSPITAL_COMMUNITY): Payer: Self-pay | Admitting: Emergency Medicine

## 2012-11-26 ENCOUNTER — Ambulatory Visit: Payer: Medicare Other | Admitting: Cardiovascular Disease

## 2012-11-26 NOTE — Telephone Encounter (Signed)
The pt was discharged home.  [facility] wants to know if Dr Janee Morn will write orders for skilled nursing, physical therapy and home health aide.  She said an order can be faxed to [phone].

## 2012-11-26 NOTE — Telephone Encounter (Signed)
I spoke to Nichols from [facility]. I am willing to sign orders. The complex part, however, is the foot wound which Dr. Kelly Splinter is caring for. Baxter Hire was going to check with Sanger's office. I am willing to sign orders if needed.

## 2012-11-27 ENCOUNTER — Encounter (HOSPITAL_BASED_OUTPATIENT_CLINIC_OR_DEPARTMENT_OTHER): Payer: Self-pay | Admitting: *Deleted

## 2012-11-27 ENCOUNTER — Other Ambulatory Visit: Payer: Self-pay | Admitting: Plastic Surgery

## 2012-11-27 NOTE — Progress Notes (Signed)
Pt was in a rehab center but is home now-not sure if home health coming-he has a friend to drive him to wound center-I had him write address of dsc-and instructions-bring all meds and overnight bag-he has nobody to care for him. There was a note about him going to a Skilled care but he did not know about that-not sure what his foot will look like dos.

## 2012-11-27 NOTE — H&P (Signed)
  Albert Hersch  11/22/2012 11:30 AM   Office Visit  MRN:  [id number]  Department: [facility]  Dept Phone: [phone]  Description: Male DOB: 08-05-1926  Provider: Alan Ripper Sanger   Diagnoses    Melanoma (HCC)    -  Primary   172.9   Vitals - Last Recorded    158/78  93  1.753 m (5\' 9" )  80.287 kg (177 lb)  26.13 kg/m2  100%       Subjective:     Patient ID: Craig Martin is a 79 y.o. male.  HPI  The patient is an 79 yrs old bm here for follow up after excision of a melanoma on the right foot. He reports that he has left the nursing facility a couple of days ago.   The Acell has incorporated but the wound has increased odor today which is not typical of Acell treatment . The patient reports he has had some increased drainage from the wound. The wound bed is mostly pale pink, but there is some slough and necrotic tissue in the heel area. He is less tender than last week. He may be slightly more edematous, but he does report that he has not been keeping his legs up much since he returned home. I encouraged him to elevate his legs more.    He did see Dr. Violeta Gelinas this week and has developed some seroma of his left groin following lymph node resection and will see Dr. Janee Morn again in a couple of weeks.   The following portions of the patient's history were reviewed and updated as appropriate: allergies, current medications, past family history, past medical history, past social history, past surgical history and problem list.  Review of Systems    Objective:    Physical Exam  Cardiovascular: Normal rate.   Pulmonary/Chest: Effort normal. No respiratory distress.  Musculoskeletal:  Right foot- The Acell has incorporated but the wound has increased odor today which is not typical of Acell treatment . The patient reports he has had some increased drainage from the wound. The wound bed is mostly pale pink, but there is some slough and necrotic tissue in the heel area.       Assessment:     Melanoma (HCC)        Plan:     Plan for irrigation and debridement with placement of Acell and possible VAC.

## 2012-12-03 ENCOUNTER — Encounter (HOSPITAL_BASED_OUTPATIENT_CLINIC_OR_DEPARTMENT_OTHER): Payer: Self-pay

## 2012-12-03 NOTE — Progress Notes (Signed)
Spoke with Dr Kelly Splinter, informed her pt was unclear about follow up and home care after surgery.  She will have staff check with pt.

## 2012-12-04 ENCOUNTER — Encounter (HOSPITAL_BASED_OUTPATIENT_CLINIC_OR_DEPARTMENT_OTHER): Payer: Self-pay | Admitting: *Deleted

## 2012-12-04 ENCOUNTER — Encounter (HOSPITAL_BASED_OUTPATIENT_CLINIC_OR_DEPARTMENT_OTHER): Payer: Medicare Other | Admitting: Anesthesiology

## 2012-12-04 ENCOUNTER — Observation Stay (HOSPITAL_BASED_OUTPATIENT_CLINIC_OR_DEPARTMENT_OTHER)
Admission: RE | Admit: 2012-12-04 | Discharge: 2012-12-05 | Disposition: A | Discharge status: Home / Self Care | Payer: Medicare Other | Source: Ambulatory Visit | Attending: Emergency Medicine | Admitting: Emergency Medicine

## 2012-12-04 ENCOUNTER — Encounter (HOSPITAL_COMMUNITY)
Admission: RE | Disposition: A | Discharge status: Home / Self Care | Payer: Self-pay | Source: Ambulatory Visit | Attending: Emergency Medicine

## 2012-12-04 ENCOUNTER — Ambulatory Visit (HOSPITAL_BASED_OUTPATIENT_CLINIC_OR_DEPARTMENT_OTHER): Payer: Medicare Other | Admitting: Anesthesiology

## 2012-12-04 DIAGNOSIS — I10 Essential (primary) hypertension: Secondary | ICD-10-CM | POA: Insufficient documentation

## 2012-12-04 DIAGNOSIS — L97509 Non-pressure chronic ulcer of other part of unspecified foot with unspecified severity: Secondary | ICD-10-CM | POA: Diagnosis present

## 2012-12-04 DIAGNOSIS — I4949 Other premature depolarization: Secondary | ICD-10-CM | POA: Diagnosis not present

## 2012-12-04 DIAGNOSIS — Z8673 Personal history of transient ischemic attack (TIA), and cerebral infarction without residual deficits: Secondary | ICD-10-CM | POA: Insufficient documentation

## 2012-12-04 DIAGNOSIS — I472 Ventricular tachycardia, unspecified: Secondary | ICD-10-CM | POA: Insufficient documentation

## 2012-12-04 DIAGNOSIS — Z79899 Other long term (current) drug therapy: Secondary | ICD-10-CM | POA: Diagnosis not present

## 2012-12-04 DIAGNOSIS — I999 Unspecified disorder of circulatory system: Secondary | ICD-10-CM | POA: Insufficient documentation

## 2012-12-04 DIAGNOSIS — I499 Cardiac arrhythmia, unspecified: Secondary | ICD-10-CM | POA: Insufficient documentation

## 2012-12-04 DIAGNOSIS — I4729 Other ventricular tachycardia: Secondary | ICD-10-CM | POA: Insufficient documentation

## 2012-12-04 DIAGNOSIS — E785 Hyperlipidemia, unspecified: Secondary | ICD-10-CM | POA: Diagnosis not present

## 2012-12-04 DIAGNOSIS — Z9861 Coronary angioplasty status: Secondary | ICD-10-CM | POA: Insufficient documentation

## 2012-12-04 DIAGNOSIS — Z7982 Long term (current) use of aspirin: Secondary | ICD-10-CM | POA: Insufficient documentation

## 2012-12-04 DIAGNOSIS — I2510 Atherosclerotic heart disease of native coronary artery without angina pectoris: Secondary | ICD-10-CM | POA: Insufficient documentation

## 2012-12-04 DIAGNOSIS — I4891 Unspecified atrial fibrillation: Secondary | ICD-10-CM | POA: Insufficient documentation

## 2012-12-04 DIAGNOSIS — C439 Malignant melanoma of skin, unspecified: Secondary | ICD-10-CM | POA: Diagnosis not present

## 2012-12-04 DIAGNOSIS — Z87891 Personal history of nicotine dependence: Secondary | ICD-10-CM | POA: Diagnosis not present

## 2012-12-04 DIAGNOSIS — E119 Type 2 diabetes mellitus without complications: Secondary | ICD-10-CM | POA: Diagnosis not present

## 2012-12-04 DIAGNOSIS — N400 Enlarged prostate without lower urinary tract symptoms: Secondary | ICD-10-CM | POA: Diagnosis not present

## 2012-12-04 DIAGNOSIS — M129 Arthropathy, unspecified: Secondary | ICD-10-CM | POA: Diagnosis not present

## 2012-12-04 DIAGNOSIS — I498 Other specified cardiac arrhythmias: Secondary | ICD-10-CM | POA: Insufficient documentation

## 2012-12-04 DIAGNOSIS — H409 Unspecified glaucoma: Secondary | ICD-10-CM | POA: Diagnosis not present

## 2012-12-04 DIAGNOSIS — L97909 Non-pressure chronic ulcer of unspecified part of unspecified lower leg with unspecified severity: Secondary | ICD-10-CM | POA: Diagnosis present

## 2012-12-04 DIAGNOSIS — I482 Chronic atrial fibrillation, unspecified: Secondary | ICD-10-CM | POA: Diagnosis present

## 2012-12-04 HISTORY — PX: INCISION AND DRAINAGE OF WOUND: SHX1803

## 2012-12-04 LAB — POCT I-STAT, CHEM 8
BUN: 25 mg/dL — ABNORMAL HIGH (ref 6–23)
BUN: 20 mg/dL (ref 6–23)
Calcium, Ion: 1.12 mmol/L — ABNORMAL LOW (ref 1.13–1.30)
Chloride: 103 mEq/L (ref 96–112)
Creatinine, Ser: 1.8 mg/dL — ABNORMAL HIGH (ref 0.50–1.35)
Glucose, Bld: 133 mg/dL — ABNORMAL HIGH (ref 70–99)
HCT: 28 % — ABNORMAL LOW (ref 39.0–52.0)
Potassium: 3.3 mEq/L — ABNORMAL LOW (ref 3.5–5.1)
Sodium: 142 mEq/L (ref 135–145)
Sodium: 141 mEq/L (ref 135–145)
TCO2: 24 mmol/L (ref 0–100)

## 2012-12-04 LAB — CBC WITH DIFFERENTIAL/PLATELET
Basophils Absolute: 0 10*3/uL (ref 0.0–0.1)
Hemoglobin: 8.4 g/dL — ABNORMAL LOW (ref 13.0–17.0)
Lymphocytes Relative: 24 % (ref 12–46)
Lymphs Abs: 1.4 10*3/uL (ref 0.7–4.0)
MCV: 91.1 fL (ref 78.0–100.0)
Monocytes Relative: 7 % (ref 3–12)
Neutrophils Relative %: 67 % (ref 43–77)
Platelets: 239 10*3/uL (ref 150–400)
RBC: 2.82 MIL/uL — ABNORMAL LOW (ref 4.22–5.81)
WBC: 5.7 10*3/uL (ref 4.0–10.5)

## 2012-12-04 LAB — POCT I-STAT TROPONIN I: Troponin i, poc: 0.02 ng/mL (ref 0.00–0.08)

## 2012-12-04 LAB — GLUCOSE, CAPILLARY: Glucose-Capillary: 123 mg/dL — ABNORMAL HIGH (ref 70–99)

## 2012-12-04 SURGERY — IRRIGATION AND DEBRIDEMENT WOUND
Anesthesia: General | Site: Foot | Laterality: Right

## 2012-12-04 MED ORDER — TIMOLOL MALEATE 0.5 % OP SOLN
1.0000 [drp] | Freq: Every day | OPHTHALMIC | Status: DC
  Administered 2012-12-04 – 2012-12-05 (×2): 1 [drp] via OPHTHALMIC
  Filled 2012-12-04: qty 5

## 2012-12-04 MED ORDER — SODIUM CHLORIDE 0.45 % IV SOLN
INTRAVENOUS | Status: DC
  Administered 2012-12-04: 17:00:00 via INTRAVENOUS

## 2012-12-04 MED ORDER — HYDROMORPHONE HCL PF 1 MG/ML IJ SOLN
INTRAMUSCULAR | Status: AC
  Filled 2012-12-04: qty 1

## 2012-12-04 MED ORDER — LOSARTAN POTASSIUM 25 MG PO TABS
25.0000 mg | ORAL_TABLET | Freq: Every day | ORAL | Status: DC
  Administered 2012-12-04: 25 mg via ORAL
  Filled 2012-12-04 (×2): qty 1

## 2012-12-04 MED ORDER — LACTATED RINGERS IV SOLN
INTRAVENOUS | Status: DC
  Administered 2012-12-04: 14:00:00 via INTRAVENOUS

## 2012-12-04 MED ORDER — FUROSEMIDE 20 MG PO TABS
20.0000 mg | ORAL_TABLET | Freq: Every day | ORAL | Status: DC
  Administered 2012-12-04: 20 mg via ORAL
  Filled 2012-12-04 (×2): qty 1

## 2012-12-04 MED ORDER — ASPIRIN EC 81 MG PO TBEC
81.0000 mg | DELAYED_RELEASE_TABLET | Freq: Every day | ORAL | Status: DC
  Filled 2012-12-04 (×2): qty 1

## 2012-12-04 MED ORDER — OXYCODONE HCL 5 MG PO TABS: 5.0000 mg | ORAL_TABLET | Freq: Once | ORAL | Status: DC | PRN

## 2012-12-04 MED ORDER — CEFAZOLIN SODIUM 1-5 GM-% IV SOLN
INTRAVENOUS | Status: AC
  Filled 2012-12-04: qty 100

## 2012-12-04 MED ORDER — LIDOCAINE-EPINEPHRINE 1 %-1:100000 IJ SOLN
INTRAMUSCULAR | Status: AC
  Filled 2012-12-04: qty 1

## 2012-12-04 MED ORDER — MORPHINE SULFATE 2 MG/ML IJ SOLN
2.0000 mg | INTRAMUSCULAR | Status: DC | PRN
  Administered 2012-12-04 – 2012-12-05 (×2): 2 mg via INTRAVENOUS
  Filled 2012-12-04 (×2): qty 1

## 2012-12-04 MED ORDER — KCL IN DEXTROSE-NACL 20-5-0.45 MEQ/L-%-% IV SOLN: INTRAVENOUS | Status: DC

## 2012-12-04 MED ORDER — METOPROLOL TARTRATE 25 MG PO TABS: 12.5000 mg | ORAL_TABLET | Freq: Two times a day (BID) | ORAL | Status: DC

## 2012-12-04 MED ORDER — BACITRACIN-NEOMYCIN-POLYMYXIN 400-5-5000 EX OINT
TOPICAL_OINTMENT | CUTANEOUS | Status: DC | PRN
  Administered 2012-12-04: 1 via TOPICAL

## 2012-12-04 MED ORDER — BUPIVACAINE-EPINEPHRINE PF 0.25-1:200000 % IJ SOLN
INTRAMUSCULAR | Status: AC
  Filled 2012-12-04: qty 30

## 2012-12-04 MED ORDER — POTASSIUM CHLORIDE CRYS ER 20 MEQ PO TBCR: 20.0000 meq | EXTENDED_RELEASE_TABLET | Freq: Every day | ORAL | Status: DC

## 2012-12-04 MED ORDER — LIDOCAINE HCL (CARDIAC) 20 MG/ML IV SOLN: INTRAVENOUS | Status: DC | PRN

## 2012-12-04 MED ORDER — SODIUM CHLORIDE 0.9 % IR SOLN
Status: DC | PRN
  Administered 2012-12-04: 16:00:00

## 2012-12-04 MED ORDER — FENTANYL CITRATE 0.05 MG/ML IJ SOLN
INTRAMUSCULAR | Status: AC
  Filled 2012-12-04: qty 2

## 2012-12-04 MED ORDER — LATANOPROST 0.005 % OP SOLN
1.0000 [drp] | Freq: Every day | OPHTHALMIC | Status: DC
  Filled 2012-12-04: qty 2.5

## 2012-12-04 MED ORDER — MEPERIDINE HCL 25 MG/ML IJ SOLN: 6.2500 mg | INTRAMUSCULAR | Status: DC | PRN

## 2012-12-04 MED ORDER — FENTANYL CITRATE 0.05 MG/ML IJ SOLN: 50.0000 ug | INTRAMUSCULAR | Status: DC | PRN

## 2012-12-04 MED ORDER — CEFAZOLIN SODIUM-DEXTROSE 2-3 GM-% IV SOLR
2.0000 g | INTRAVENOUS | Status: AC
  Administered 2012-12-04: 2 g via INTRAVENOUS

## 2012-12-04 MED ORDER — OXYCODONE HCL 5 MG/5ML PO SOLN: 5.0000 mg | Freq: Once | ORAL | Status: DC | PRN

## 2012-12-04 MED ORDER — METOPROLOL TARTRATE 25 MG PO TABS: 25.0000 mg | ORAL_TABLET | Freq: Two times a day (BID) | ORAL | Status: DC

## 2012-12-04 MED ORDER — LINAGLIPTIN 5 MG PO TABS
5.0000 mg | ORAL_TABLET | Freq: Every day | ORAL | Status: DC
  Administered 2012-12-04: 5 mg via ORAL
  Filled 2012-12-04 (×2): qty 1

## 2012-12-04 MED ORDER — METOPROLOL TARTRATE 25 MG PO TABS
25.0000 mg | ORAL_TABLET | Freq: Two times a day (BID) | ORAL | Status: DC
  Administered 2012-12-04: 25 mg via ORAL
  Filled 2012-12-04 (×3): qty 1

## 2012-12-04 MED ORDER — ONDANSETRON HCL 4 MG/2ML IJ SOLN: 4.0000 mg | Freq: Once | INTRAMUSCULAR | Status: DC | PRN

## 2012-12-04 MED ORDER — PROPOFOL 10 MG/ML IV BOLUS
INTRAVENOUS | Status: DC | PRN
  Administered 2012-12-04: 150 mg via INTRAVENOUS

## 2012-12-04 MED ORDER — DOCUSATE SODIUM 100 MG PO CAPS
100.0000 mg | ORAL_CAPSULE | Freq: Two times a day (BID) | ORAL | Status: DC
Start: 2012-12-04 — End: 2012-12-05
  Administered 2012-12-04: 100 mg via ORAL
  Filled 2012-12-04 (×3): qty 1

## 2012-12-04 MED ORDER — HYDROMORPHONE HCL PF 1 MG/ML IJ SOLN
0.2500 mg | INTRAMUSCULAR | Status: DC | PRN
  Administered 2012-12-04 (×3): 0.5 mg via INTRAVENOUS

## 2012-12-04 MED ORDER — MIDAZOLAM HCL 2 MG/2ML IJ SOLN: 1.0000 mg | INTRAMUSCULAR | Status: DC | PRN

## 2012-12-04 MED ORDER — BACITRACIN-NEOMYCIN-POLYMYXIN 400-5-5000 EX OINT
TOPICAL_OINTMENT | CUTANEOUS | Status: AC
  Filled 2012-12-04: qty 1

## 2012-12-04 MED ORDER — OXYCODONE-ACETAMINOPHEN 5-325 MG PO TABS
1.0000 | ORAL_TABLET | Freq: Four times a day (QID) | ORAL | Status: DC | PRN
  Administered 2012-12-04: 2 via ORAL
  Filled 2012-12-04: qty 2

## 2012-12-04 MED ORDER — POTASSIUM CHLORIDE CRYS ER 20 MEQ PO TBCR
40.0000 meq | EXTENDED_RELEASE_TABLET | ORAL | Status: AC
  Administered 2012-12-04: 40 meq via ORAL
  Filled 2012-12-04: qty 2

## 2012-12-04 MED ORDER — FENTANYL CITRATE 0.05 MG/ML IJ SOLN
INTRAMUSCULAR | Status: DC | PRN
  Administered 2012-12-04: 25 ug via INTRAVENOUS
  Administered 2012-12-04: 50 ug via INTRAVENOUS
  Administered 2012-12-04: 25 ug via INTRAVENOUS

## 2012-12-04 MED ORDER — LIDOCAINE HCL (CARDIAC) 20 MG/ML IV SOLN
INTRAVENOUS | Status: DC | PRN
  Administered 2012-12-04: 100 mg via INTRAVENOUS

## 2012-12-04 SURGICAL SUPPLY — 70 items
BAG DECANTER FOR FLEXI CONT (MISCELLANEOUS) IMPLANT
BENZOIN TINCTURE PRP APPL 2/3 (GAUZE/BANDAGES/DRESSINGS) IMPLANT
BLADE HEX COATED 2.75 (ELECTRODE) IMPLANT
BLADE SURG 10 STRL SS (BLADE) ×2 IMPLANT
BLADE SURG 15 STRL LF DISP TIS (BLADE) IMPLANT
BLADE SURG 15 STRL SS (BLADE)
CANISTER SUCT 1200ML W/VALVE (MISCELLANEOUS) ×2 IMPLANT
CANISTER SUCT 3000ML (MISCELLANEOUS) IMPLANT
CANISTER SUCT LVC 12 LTR MEDI- (MISCELLANEOUS) IMPLANT
CHLORAPREP W/TINT 26ML (MISCELLANEOUS) IMPLANT
COVER MAYO STAND STRL (DRAPES) ×2 IMPLANT
COVER TABLE BACK 60X90 (DRAPES) ×2 IMPLANT
DECANTER SPIKE VIAL GLASS SM (MISCELLANEOUS) IMPLANT
DRAIN CHANNEL 19F RND (DRAIN) IMPLANT
DRAIN PENROSE 1/2X12 LTX STRL (WOUND CARE) IMPLANT
DRAPE EXTREMITY T 121X128X90 (DRAPE) ×2 IMPLANT
DRAPE INCISE IOBAN 66X45 STRL (DRAPES) ×2 IMPLANT
DRAPE LAPAROSCOPIC ABDOMINAL (DRAPES) IMPLANT
DRAPE PED LAPAROTOMY (DRAPES) IMPLANT
DRSG ADAPTIC 3X8 NADH LF (GAUZE/BANDAGES/DRESSINGS) ×2 IMPLANT
DRSG EMULSION OIL 3X3 NADH (GAUZE/BANDAGES/DRESSINGS) IMPLANT
DRSG VAC ATS MED SENSATRAC (GAUZE/BANDAGES/DRESSINGS) ×2 IMPLANT
ELECT REM PT RETURN 9FT ADLT (ELECTROSURGICAL) ×2
ELECTRODE REM PT RTRN 9FT ADLT (ELECTROSURGICAL) ×1 IMPLANT
EVACUATOR SILICONE 100CC (DRAIN) IMPLANT
GAUZE SPONGE 4X4 12PLY STRL LF (GAUZE/BANDAGES/DRESSINGS) IMPLANT
GAUZE XEROFORM 1X8 LF (GAUZE/BANDAGES/DRESSINGS) IMPLANT
GAUZE XEROFORM 5X9 LF (GAUZE/BANDAGES/DRESSINGS) IMPLANT
GLOVE BIO SURGEON STRL SZ 6.5 (GLOVE) ×4 IMPLANT
GLOVE BIOGEL M STRL SZ7.5 (GLOVE) ×2 IMPLANT
GLOVE BIOGEL PI IND STRL 8 (GLOVE) ×1 IMPLANT
GLOVE BIOGEL PI INDICATOR 8 (GLOVE) ×1
GOWN PREVENTION PLUS XLARGE (GOWN DISPOSABLE) ×2 IMPLANT
GOWN STRL REIN 2XL LVL4 (GOWN DISPOSABLE) ×2 IMPLANT
HANDPIECE INTERPULSE COAX TIP (DISPOSABLE)
IV NS IRRIG 3000ML ARTHROMATIC (IV SOLUTION) IMPLANT
MATRIX SURGICAL PSM 7X10CM (Tissue) ×2 IMPLANT
MICROMATRIX 500MG (Tissue) ×4 IMPLANT
NEEDLE 27GAX1X1/2 (NEEDLE) IMPLANT
NS IRRIG 1000ML POUR BTL (IV SOLUTION) IMPLANT
PACK BASIN DAY SURGERY FS (CUSTOM PROCEDURE TRAY) ×2 IMPLANT
PAD ABD 8X10 STRL (GAUZE/BANDAGES/DRESSINGS) ×2 IMPLANT
PENCIL BUTTON HOLSTER BLD 10FT (ELECTRODE) ×2 IMPLANT
PIN SAFETY STERILE (MISCELLANEOUS) IMPLANT
SET HNDPC FAN SPRY TIP SCT (DISPOSABLE) IMPLANT
SHEET MEDIUM DRAPE 40X70 STRL (DRAPES) ×2 IMPLANT
SLEEVE SCD COMPRESS KNEE MED (MISCELLANEOUS) ×2 IMPLANT
SOLUTION PARTIC MCRMTRX 500MG (Tissue) ×2 IMPLANT
SPONGE GAUZE 4X4 12PLY (GAUZE/BANDAGES/DRESSINGS) ×2 IMPLANT
SPONGE LAP 18X18 X RAY DECT (DISPOSABLE) ×2 IMPLANT
SPONGE LAP 4X18 X RAY DECT (DISPOSABLE) IMPLANT
STAPLER VISISTAT 35W (STAPLE) ×2 IMPLANT
STRIP CLOSURE SKIN 1/2X4 (GAUZE/BANDAGES/DRESSINGS) IMPLANT
SUCTION FRAZIER TIP 10 FR DISP (SUCTIONS) IMPLANT
SURGILUBE 2OZ TUBE FLIPTOP (MISCELLANEOUS) ×2 IMPLANT
SUT MNCRL AB 4-0 PS2 18 (SUTURE) ×2 IMPLANT
SUT SILK 3 0 PS 1 (SUTURE) IMPLANT
SUT VIC AB 3-0 FS2 27 (SUTURE) ×6 IMPLANT
SUT VIC AB 5-0 PS2 18 (SUTURE) IMPLANT
SUT VICRYL 4-0 PS2 18IN ABS (SUTURE) IMPLANT
SWAB COLLECTION DEVICE MRSA (MISCELLANEOUS) IMPLANT
SYR BULB IRRIGATION 50ML (SYRINGE) IMPLANT
SYR CONTROL 10ML LL (SYRINGE) IMPLANT
TAPE HYPAFIX 6X30 (GAUZE/BANDAGES/DRESSINGS) IMPLANT
TOWEL OR 17X24 6PK STRL BLUE (TOWEL DISPOSABLE) ×4 IMPLANT
TRAY DSU PREP LF (CUSTOM PROCEDURE TRAY) ×2 IMPLANT
TUBE ANAEROBIC SPECIMEN COL (MISCELLANEOUS) IMPLANT
TUBE CONNECTING 20X1/4 (TUBING) ×2 IMPLANT
UNDERPAD 30X30 INCONTINENT (UNDERPADS AND DIAPERS) ×2 IMPLANT
YANKAUER SUCT BULB TIP NO VENT (SUCTIONS) ×2 IMPLANT

## 2012-12-04 NOTE — Op Note (Signed)
Operative Note   DATE OF OPERATION: 12/04/2012  LOCATION: [facility]  SURGICAL DIVISION: Plastic Surgery  PREOPERATIVE DIAGNOSES:  Right foot ulcer secondary to excision of melanoma  POSTOPERATIVE DIAGNOSES:  same  PROCEDURE:  Placement of Acell and VAC after irrigation and debridement for preparation on right foot 6 x 7 cm.  SURGEON: Wayland Denis, DO  ASSISTANT: Shawn Rayburn, PA  ANESTHESIA:  General.   COMPLICATIONS: None.   INDICATIONS FOR PROCEDURE:  The patient, Craig Martin, is a 79 y.o. male born on 29-Nov-1926, is here for treatment of right foot ulcer   CONSENT:  Informed consent was obtained directly from the patient. Risks, benefits and alternatives were fully discussed. Specific risks including but not limited to bleeding, infection, hematoma, seroma, scarring, pain, asymmetry, wound healing problems, and need for further surgery were all discussed. The patient did have an ample opportunity to have questions answered to satisfaction.   DESCRIPTION OF PROCEDURE:  The patient was taken to the operating room. SCDs were placed and IV antibiotics were given. The patient's operative site was prepped and draped in a sterile fashion. A time out was performed and all information was confirmed to be correct.  General anesthesia was administered.  A #10 blade and scissors were used to debride the wound of the right foot.  This included skin, subcutaneous tissue, muscle and tendon.  Antibiotic solution was used to irrigate the wound. The Acell powder was then placed with an Acell sheet.  The sheet was secured using 5-0 Vicryl. The adaptic and VAC sponge were placed with an excellent seal. The machine was set at 125 mmHg pressure.  The patient was allowed to wake from anesthesia, extubated and taken to the recovery room in satisfactory condition.

## 2012-12-04 NOTE — Anesthesia Procedure Notes (Signed)
Procedure Name: LMA Insertion Date/Time: 12/04/2012 3:37 PM Performed by: Wayland Denis Pre-anesthesia Checklist: Patient identified, Emergency Drugs available, Suction available and Patient being monitored Patient Re-evaluated:Patient Re-evaluated prior to inductionOxygen Delivery Method: Circle System Utilized Preoxygenation: Pre-oxygenation with 100% oxygen Intubation Type: IV induction Ventilation: Mask ventilation without difficulty LMA: LMA inserted LMA Size: 5.0 Number of attempts: 1 Airway Equipment and Method: bite block Placement Confirmation: positive ETCO2 and breath sounds checked- equal and bilateral Tube secured with: Tape Dental Injury: Teeth and Oropharynx as per pre-operative assessment

## 2012-12-04 NOTE — Brief Op Note (Signed)
12/04/2012  4:33 PM  PATIENT:  Craig Martin  79 y.o. male  PRE-OPERATIVE DIAGNOSIS:  RIGHT FOOT WOUND  POST-OPERATIVE DIAGNOSIS:  RIGHT FOOT WOUND  PROCEDURE:  Procedure(s): IRRIGATION AND DEBRIDEMENT RIGHT FOOT WOUND WITH PLACEMENT OF ACELL/VAC (Right)  SURGEON:  Surgeon(s) and Role:    * Claire Sanger, DO - Primary  PHYSICIAN ASSISTANT: none  ASSISTANTS: none   ANESTHESIA:   general  EBL:  Total I/O In: 750 [I.V.:750] Out: -   BLOOD ADMINISTERED:none  DRAINS: none   LOCAL MEDICATIONS USED:  NONE  SPECIMEN:  No Specimen  DISPOSITION OF SPECIMEN:  N/A  COUNTS:  YES  TOURNIQUET:  * No tourniquets in log *  DICTATION: .Dragon Dictation  PLAN OF CARE: Admit for overnight observation  PATIENT DISPOSITION:  PACU - hemodynamically stable.   Delay start of Pharmacological VTE agent (>24hrs) due to surgical blood loss or risk of bleeding: no

## 2012-12-04 NOTE — ED Notes (Signed)
Pt was having I&D and Wound Vac placed. Pt was in post-op when he had a run of Vtach. Cardiologist is going to come in and evaluate the patient.  EKG- Afib 80-90, intermittent PVC 20 G R hand 4/10 pain in the right heal. Pt received (at 1735) 0.5 Dilaudid at the facility for the I&D.

## 2012-12-04 NOTE — Consult Note (Signed)
CONSULTATION NOTE  Reason for Consult: PVC's, NSVT post-op  Requesting Physician: Dr. Rubin Payor  Cardiologist: Dr. Royann Shivers  HPI: This is a 79 y.o. male with a past medical history significant for PAD and CAD s/p PCI to the LCx in 2004 by Dr. Jenne Campus and moderate LAD and RCA disease. He recently underwent a melanoma excision and lymph node biopsy. He was noted to have post-op a-fib and PVC's. He has also had NSVT - a few runs of 8-9 beats, generally asymptomatic. EKG showed a-fib, LAFB, and Right bundle morphology PVC's, suggesting an LV origin. His echocardiogram at his last admission was normal, with EF of 60%.  I saw him in consult and recommended starting a b-blocker, repleting his potassium and arranging an outpatient NST/Monitor.  It does not appear that he ever was fitted for the monitor, nor did he have the stress test that we recommended. It was recently recommended that he return for additional surgery/melanoma incision on his foot.  During and after the procedure today he had a number of PVC's and a short (3-4 beat) run of NSVT. He was asymptomatic with this again, no chest pain.  He was sent to the hospital for evaluation.  Potassium was noted to be 3.5 at the time of the procedure.  PMHx:  Past Medical History  Diagnosis Date  . Glaucoma   . Bradycardia   . Diabetes mellitus   . Hyperlipidemia   . Hypertension   . Shortness of breath     WITH EXERTION  . Stroke     "I had a stroke many yrs ago" -no residual problems  . BPH (benign prostatic hyperplasia)   . Difficulty voiding   . Dysrhythmia     AFIB  - TAKES PRADAXA FOLLOWED BY DR. Allyson Sabal  . Critical lower limb ischemia   . Melanoma   . Arthritis   . Coronary artery disease    Past Surgical History  Procedure Laterality Date  . Stented coronary artery  2004  . Transurethral resection of prostate  01/24/2012    Procedure: TRANSURETHRAL RESECTION OF THE PROSTATE WITH GYRUS INSTRUMENTS;  Surgeon: Sebastian Ache,  MD;  Location: [facility];  Service: Urology;  Laterality: N/A;  . Cardiac catheterization  09/19/2002    moderate LAD and RCA and normal LV function  . Nm myocar perf wall motion  05/24/2011    showed inferior scar w/o ischemia  . Doppler echocardiography  05/24/2011    EF >55%  . Doppler echocardiography  09/28/2009    EF >55% lvh w/ left atrial enlargement,diastolic dysfunctio,elevated lv filling pressure  . Nm myocar perf wall motion  09/09/2009    mild ischemia mid anterior,apical anterior and basal inferolateral  . Nm myocar perf wall motion  12/04/2002  . Nm myocar perf wall motion  05/21/2002  . Cardiac catheterization  07/03/2002  . Cataract extraction    . Melanoma excision Right 10/21/2012    Procedure: MELANOMA EXCISION;  Surgeon: Liz Malady, MD;  [facility];  Service: General;  Laterality: Right;  . Inguinal lymph node biopsy Right 10/21/2012    Procedure: INGUINAL LYMPH NODE BIOPSY;  Surgeon: Liz Malady, MD;  Location: [facility] OR;  Service: General;  Laterality: Right;  . Application of a-cell of extremity Right 10/21/2012    Procedure: APPLICATION OF A-CELL OF EXTREMITY;  Surgeon: Wayland Denis, DO;  Location: [facility] OR;  Service: Plastics;  Laterality: Right;    FAMHx: Family History  Problem Relation Age of Onset  .  Diabetes Mother   . Diabetes Brother     SOCHx:  reports that he quit smoking about 26 years ago. His smoking use included Cigarettes. He smoked 1.50 packs per day. He has quit using smokeless tobacco. He reports that he does not drink alcohol or use illicit drugs.  ALLERGIES: No Known Allergies  ROS: A comprehensive review of systems was negative except for: Musculoskeletal: positive for right foot pain  HOME MEDICATIONS: aspirin EC 81 MG tablet   Take 81 mg by mouth daily.    dabigatran 75 MG Caps capsule   Commonly known as: PRADAXA   Take by mouth every 12 (twelve) hours.    DSS 100 MG Caps   Take 100 mg by mouth 2 (two) times daily.     furosemide 20 MG tablet   Commonly known as: LASIX   Take 20 mg by mouth daily.    linagliptin 5 MG Tabs tablet   Commonly known as: TRADJENTA   Take 5 mg by mouth daily.    losartan 25 MG tablet   Commonly known as: COZAAR   Take 25 mg by mouth daily.    LUMIGAN 0.01 % Soln   Generic drug: bimatoprost   Place 1 drop into both eyes at bedtime.    metoprolol tartrate 12.5 mg Tabs tablet   Commonly known as: LOPRESSOR   Take 0.5 tablets (12.5 mg total) by mouth 2 (two) times daily.    oxyCODONE-acetaminophen 5-325 MG per tablet   Commonly known as: PERCOCET/ROXICET   Take 1-2 tablets by mouth every 6 (six) hours as needed for pain.    timolol 0.5 % ophthalmic solution   Commonly known as: TIMOPTIC   Place 1 drop into both eyes daily.     HOSPITAL MEDICATIONS: Prior to Admission:  (Not in a hospital admission)  VITALS: Blood pressure 132/52, pulse 83, temperature 97.9 F (36.6 C), temperature source Oral, resp. rate 20, height 5\' 9"  (1.753 m), weight 182 lb (82.555 kg), SpO2 100.00%.  PHYSICAL EXAM: General appearance: alert and no distress Neck: no carotid bruit and no JVD Lungs: clear to auscultation bilaterally Heart: irregularly irregular rhythm Abdomen: soft, non-tender; bowel sounds normal; no masses,  no organomegaly Extremities: right foot/leg is bandaged with wound vac, walking shoe Pulses: left dorsalis pedis pulse is 2+ Skin: Skin color, texture, turgor normal. No rashes or lesions Neurologic: Grossly normal Psych: Mood, affect normal  LABS: Results for orders placed during the hospital encounter of 12/04/12 (from the past 48 hour(s))  POCT I-STAT, CHEM 8     Status: Abnormal   Collection Time    12/04/12  1:38 PM      Result Value Range   Sodium 142  135 - 145 mEq/L   Potassium 3.5  3.5 - 5.1 mEq/L   Chloride 103  96 - 112 mEq/L   BUN 25 (*) 6 - 23 mg/dL   Creatinine, Ser 1.47 (*) 0.50 - 1.35 mg/dL   Glucose, Bld 829 (*) 70 - 99 mg/dL    Calcium, Ion 5.62 (*) 1.13 - 1.30 mmol/L   TCO2 24  0 - 100 mmol/L   Hemoglobin 9.5 (*) 13.0 - 17.0 g/dL   HCT 13.0 (*) 86.5 - 78.4 %  GLUCOSE, CAPILLARY     Status: Abnormal   Collection Time    12/04/12  5:10 PM      Result Value Range   Glucose-Capillary 124 (*) 70 - 99 mg/dL  CBC WITH DIFFERENTIAL     Status:  Abnormal   Collection Time    12/04/12  7:06 PM      Result Value Range   WBC 5.7  4.0 - 10.5 K/uL   RBC 2.82 (*) 4.22 - 5.81 MIL/uL   Hemoglobin 8.4 (*) 13.0 - 17.0 g/dL   HCT 82.9 (*) 56.2 - 13.0 %   MCV 91.1  78.0 - 100.0 fL   MCH 29.8  26.0 - 34.0 pg   MCHC 32.7  30.0 - 36.0 g/dL   RDW 86.5  78.4 - 69.6 %   Platelets 239  150 - 400 K/uL   Neutrophils Relative % 67  43 - 77 %   Neutro Abs 3.8  1.7 - 7.7 K/uL   Lymphocytes Relative 24  12 - 46 %   Lymphs Abs 1.4  0.7 - 4.0 K/uL   Monocytes Relative 7  3 - 12 %   Monocytes Absolute 0.4  0.1 - 1.0 K/uL   Eosinophils Relative 2  0 - 5 %   Eosinophils Absolute 0.1  0.0 - 0.7 K/uL   Basophils Relative 0  0 - 1 %   Basophils Absolute 0.0  0.0 - 0.1 K/uL  POCT I-STAT TROPONIN I     Status: None   Collection Time    12/04/12  7:17 PM      Result Value Range   Troponin i, poc 0.02  0.00 - 0.08 ng/mL   Comment 3            Comment: Due to the release kinetics of cTnI,     a negative result within the first hours     of the onset of symptoms does not rule out     myocardial infarction with certainty.     If myocardial infarction is still suspected,     repeat the test at appropriate intervals.  POCT I-STAT, CHEM 8     Status: Abnormal   Collection Time    12/04/12  7:23 PM      Result Value Range   Sodium 141  135 - 145 mEq/L   Potassium 3.3 (*) 3.5 - 5.1 mEq/L   Chloride 103  96 - 112 mEq/L   BUN 20  6 - 23 mg/dL   Creatinine, Ser 2.95 (*) 0.50 - 1.35 mg/dL   Glucose, Bld 284 (*) 70 - 99 mg/dL   Calcium, Ion 1.32 (*) 1.13 - 1.30 mmol/L   TCO2 24  0 - 100 mmol/L   Hemoglobin 10.5 (*) 13.0 - 17.0 g/dL   HCT  44.0 (*) 10.2 - 52.0 %    IMAGING: No results found.  HOSPITAL DIAGNOSES: Principal Problem:   NSVT (nonsustained ventricular tachycardia) Active Problems:   Chronic atrial fibrillation   Leg ulcer   IMPRESSION: 1. Short run NSVT 2. Chronic a-fib, rate-controlled 3. Low normal potassium  RECOMMENDATION: 1. Mr. Baldi is asymptomatic and had ventricular ectopy and very brief runs of NSVT, which is common after anesthesia, especially in a patient who is known to have VT. His most recent hospitalization post-surgery was almost exactly the same presentation as this. His echo revealed normal LV function.  I am again recommending that he schedule a nuclear stress test through our office (we will contact him to schedule), although, I doubt this is ischemically mediated.  It probably would not hurt to put him on at least 20 MEQ daily of potassium at home. In addition, he could probably tolerate an increase in his metoprolol to 25 mg BID. Otherwise,  I think he is safe for discharge home.   Thanks for the consult.  My recommendations were discussed with Dr. Rubin Payor, the ER attending.  Time Spent Directly with Patient: 30 minutes  Chrystie Nose, MD, [facility] Attending Cardiologist [facility]  HILTY,Kenneth C 12/04/2012, 7:45 PM

## 2012-12-04 NOTE — Anesthesia Preprocedure Evaluation (Addendum)
Anesthesia Evaluation  Patient identified by MRN, date of birth, ID band Patient awake    Reviewed: Allergy & Precautions, H&P , NPO status , Patient's Chart, lab work & pertinent test results  Airway Mallampati: I TM Distance: >3 FB Neck ROM: Full    Dental   Pulmonary former smoker,          Cardiovascular hypertension, Pt. on medications + CAD + dysrhythmias Atrial Fibrillation     Neuro/Psych    GI/Hepatic   Endo/Other  diabetes, Type 2  Renal/GU Renal InsufficiencyRenal disease     Musculoskeletal   Abdominal   Peds  Hematology   Anesthesia Other Findings   Reproductive/Obstetrics                          Anesthesia Physical Anesthesia Plan  ASA: III  Anesthesia Plan: General   Post-op Pain Management:    Induction: Intravenous  Airway Management Planned: LMA  Additional Equipment:   Intra-op Plan:   Post-operative Plan: Extubation in OR  Informed Consent: I have reviewed the patients History and Physical, chart, labs and discussed the procedure including the risks, benefits and alternatives for the proposed anesthesia with the patient or authorized representative who has indicated his/her understanding and acceptance.     Plan Discussed with: CRNA and Surgeon  Anesthesia Plan Comments:         Anesthesia Quick Evaluation

## 2012-12-04 NOTE — ED Provider Notes (Signed)
CSN: [id number]     Arrival date & time 12/04/12  1836 History   First MD Initiated Contact with Patient 12/04/12 1840     Chief Complaint  Patient presents with  . Palpitations    Run of Vtach Post Op I&D procedure   (Consider location/radiation/quality/duration/timing/severity/associated sxs/prior Treatment) Patient is a 79 y.o. male presenting with palpitations. The history is provided by the patient.  Palpitations Associated symptoms: no back pain, no chest pain, no nausea, no numbness, no shortness of breath and no vomiting    patient presents with a run of V. Tach. At the surgery center. He was having a right foot operation. He is a history of nonsustained V. tach. He also has a history of atrial fibrillation. Patient states she feels fine and does not know but it happened. He was reportedly sent to the ED for further evaluation by cardiology.  Past Medical History  Diagnosis Date  . Glaucoma   . Bradycardia   . Diabetes mellitus   . Hyperlipidemia   . Hypertension   . Shortness of breath     WITH EXERTION  . Stroke     "I had a stroke many yrs ago" -no residual problems  . BPH (benign prostatic hyperplasia)   . Difficulty voiding   . Dysrhythmia     AFIB  - TAKES PRADAXA FOLLOWED BY DR. Allyson Sabal  . Critical lower limb ischemia   . Melanoma   . Arthritis   . Coronary artery disease    Past Surgical History  Procedure Laterality Date  . Stented coronary artery  2004  . Transurethral resection of prostate  01/24/2012    Procedure: TRANSURETHRAL RESECTION OF THE PROSTATE WITH GYRUS INSTRUMENTS;  Surgeon: Sebastian Ache, MD;  Location: [facility];  Service: Urology;  Laterality: N/A;  . Cardiac catheterization  09/19/2002    moderate LAD and RCA and normal LV function  . Nm myocar perf wall motion  05/24/2011    showed inferior scar w/o ischemia  . Doppler echocardiography  05/24/2011    EF >55%  . Doppler echocardiography  09/28/2009    EF >55% lvh w/ left atrial  enlargement,diastolic dysfunctio,elevated lv filling pressure  . Nm myocar perf wall motion  09/09/2009    mild ischemia mid anterior,apical anterior and basal inferolateral  . Nm myocar perf wall motion  12/04/2002  . Nm myocar perf wall motion  05/21/2002  . Cardiac catheterization  07/03/2002  . Cataract extraction    . Melanoma excision Right 10/21/2012    Procedure: MELANOMA EXCISION;  Surgeon: Liz Malady, MD;  Location[facility] OR;  Service: General;  Laterality: Right;  . Inguinal lymph node biopsy Right 10/21/2012    Procedure: INGUINAL LYMPH NODE BIOPSY;  Surgeon: Liz Malady, MD;  Location: [facility] OR;  Service: General;  Laterality: Right;  . Application of a-cell of extremity Right 10/21/2012    Procedure: APPLICATION OF A-CELL OF EXTREMITY;  Surgeon: Wayland Denis, DO;  Location: [facility] OR;  Service: Plastics;  Laterality: Right;   Family History  Problem Relation Age of Onset  . Diabetes Mother   . Diabetes Brother    History  Substance Use Topics  . Smoking status: Former Smoker -- 1.50 packs/day    Types: Cigarettes    Quit date: 01/16/1986  . Smokeless tobacco: Former Neurosurgeon  . Alcohol Use: No    Review of Systems  Constitutional: Negative for activity change and appetite change.  Eyes: Negative for pain.  Respiratory: Negative for chest  tightness and shortness of breath.   Cardiovascular: Positive for palpitations. Negative for chest pain and leg swelling.  Gastrointestinal: Negative for nausea, vomiting, abdominal pain and diarrhea.  Genitourinary: Negative for flank pain.  Musculoskeletal: Negative for back pain and neck stiffness.  Skin: Positive for wound. Negative for rash.  Neurological: Negative for weakness, numbness and headaches.  Psychiatric/Behavioral: Negative for behavioral problems.    Allergies  Review of patient's allergies indicates no known allergies.  Home Medications   Current Outpatient Rx  Name  Route  Sig  Dispense  Refill  .  aspirin EC 81 MG tablet   Oral   Take 81 mg by mouth daily.         . bimatoprost (LUMIGAN) 0.01 % SOLN   Both Eyes   Place 1 drop into both eyes at bedtime.         . dabigatran (PRADAXA) 75 MG CAPS   Oral   Take by mouth every 12 (twelve) hours.         . docusate sodium 100 MG CAPS   Oral   Take 100 mg by mouth 2 (two) times daily.   60 capsule   1   . furosemide (LASIX) 20 MG tablet   Oral   Take 20 mg by mouth daily.         Marland Kitchen linagliptin (TRADJENTA) 5 MG TABS tablet   Oral   Take 5 mg by mouth daily.         Marland Kitchen losartan (COZAAR) 25 MG tablet   Oral   Take 25 mg by mouth daily.         . Misc. Devices (HEART RATE MONITOR) MISC   Does not apply   1 Device by Does not apply route daily. Wear for 2 weeks as directed   1 each   0   . oxyCODONE-acetaminophen (PERCOCET/ROXICET) 5-325 MG per tablet   Oral   Take 1-2 tablets by mouth every 6 (six) hours as needed for pain.   24 tablet   0   . timolol (TIMOPTIC) 0.5 % ophthalmic solution   Both Eyes   Place 1 drop into both eyes daily.          Marland Kitchen aspirin EC 81 MG EC tablet   Oral   Take 1 tablet (81 mg total) by mouth daily.         Marland Kitchen docusate sodium 100 MG CAPS   Oral   Take 100 mg by mouth 2 (two) times daily.   10 capsule   0   . metoprolol tartrate (LOPRESSOR) 25 MG tablet   Oral   Take 1 tablet (25 mg total) by mouth 2 (two) times daily.   30 tablet   1   . metoprolol tartrate (LOPRESSOR) 25 MG tablet   Oral   Take 1 tablet (25 mg total) by mouth 2 (two) times daily.   60 tablet   1   . oxyCODONE-acetaminophen (PERCOCET/ROXICET) 5-325 MG per tablet   Oral   Take 1-2 tablets by mouth every 6 (six) hours as needed for moderate pain.   30 tablet   0   . potassium chloride SA (K-DUR,KLOR-CON) 20 MEQ tablet   Oral   Take 1 tablet (20 mEq total) by mouth daily.   14 tablet   0    BP 99/46  Pulse 65  Temp(Src) 98.1 F (36.7 C) (Oral)  Resp 16  Ht 5\' 9"  (1.753 m)  Wt 171  lb 11.2  oz (77.883 kg)  BMI 25.34 kg/m2  SpO2 97% Physical Exam  Constitutional: He is oriented to person, place, and time. He appears well-developed and well-nourished.  HENT:  Head: Normocephalic and atraumatic.  Eyes: Pupils are equal, round, and reactive to light.  Cardiovascular: Normal rate.   irRegular rhythm  Pulmonary/Chest: Effort normal and breath sounds normal.  Abdominal: Soft. There is no tenderness.  Musculoskeletal:  Right lower extremity with wound VAC and operative dressing.  Neurological: He is alert and oriented to person, place, and time.  Skin: Skin is warm.    ED Course  Procedures (including critical care time) Labs Review Labs Reviewed  GLUCOSE, CAPILLARY - Abnormal; Notable for the following:    Glucose-Capillary 124 (*)    All other components within normal limits  CBC WITH DIFFERENTIAL - Abnormal; Notable for the following:    RBC 2.82 (*)    Hemoglobin 8.4 (*)    HCT 25.7 (*)    All other components within normal limits  GLUCOSE, CAPILLARY - Abnormal; Notable for the following:    Glucose-Capillary 123 (*)    All other components within normal limits  GLUCOSE, CAPILLARY - Abnormal; Notable for the following:    Glucose-Capillary 102 (*)    All other components within normal limits  POCT I-STAT, CHEM 8 - Abnormal; Notable for the following:    BUN 25 (*)    Creatinine, Ser 1.90 (*)    Glucose, Bld 133 (*)    Calcium, Ion 1.12 (*)    Hemoglobin 9.5 (*)    HCT 28.0 (*)    All other components within normal limits  POCT I-STAT, CHEM 8 - Abnormal; Notable for the following:    Potassium 3.3 (*)    Creatinine, Ser 1.80 (*)    Glucose, Bld 153 (*)    Calcium, Ion 1.09 (*)    Hemoglobin 10.5 (*)    HCT 31.0 (*)    All other components within normal limits  POCT I-STAT TROPONIN I   Imaging Review No results found.  EKG Interpretation    Date/Time:  Wednesday December 04 2012 18:47:17 EST Ventricular Rate:  85 PR Interval:    QRS  Duration: 93 QT Interval:  376 QTC Calculation: 447 R Axis:   -47 Text Interpretation:  Atrial fibrillation Paired ventricular premature complexes Left anterior fascicular block Abnormal R-wave progression, late transition Nonspecific T abnormalities, lateral leads Confirmed by Monigue Spraggins  MD, Ticia Virgo ([id number]) on 12/04/2012 7:54:00 PM            MDM   1. Melanoma of skin, site unspecified   2. Chronic atrial fibrillation   3. NSVT (nonsustained ventricular tachycardia)    Patient presents after nonsustained V. tach during foot surgery. Sent to the ED. Seen by cardiology and cleared for discharge home after medication adjustment. Patient states he was supposed to spend the night  at the surgery center because of the surgery. I discussed with Dr. Kelly Splinter, who will do an observation admission   Juliet Rude. Rubin Payor, MD 12/08/12 1050

## 2012-12-04 NOTE — Interval H&P Note (Signed)
History and Physical Interval Note:  12/04/2012 3:26 PM  Craig Martin  has presented today for surgery, with the diagnosis of RIGHT FOOT WOUND  The various methods of treatment have been discussed with the patient and family. After consideration of risks, benefits and other options for treatment, the patient has consented to  Procedure(s): IRRIGATION AND DEBRIDEMENT RIGHT FOOT WOUND WITH PLACEMENT OF ACELL/VAC (Right) as a surgical intervention .  The patient's history has been reviewed, patient examined, no change in status, stable for surgery.  I have reviewed the patient's chart and labs.  Questions were answered to the patient's satisfaction.     SANGER,CLAIRE

## 2012-12-04 NOTE — Anesthesia Postprocedure Evaluation (Signed)
Anesthesia Post Note  Patient: Craig Martin  Procedure(s) Performed: Procedure(s) (LRB): IRRIGATION AND DEBRIDEMENT RIGHT FOOT WOUND WITH PLACEMENT OF ACELL/VAC (Right)  Anesthesia type: general  Patient location: PACU  Post pain: Pain level controlled  Post assessment: Patient's Cardiovascular Status Stable  Last Vitals:  Filed Vitals:   12/04/12 1646  BP: 141/100  Pulse: 84  Temp:   Resp: 15    Post vital signs: Reviewed and stable  Level of consciousness: sedated  Complications: No apparent anesthesia complications Pt is having continual ectopy. Baseline Afib with 5-10 PVC's per minute. In light of Dr Croitu's preop evaluation and clearance  - tfer to [facility] for O/N observation.

## 2012-12-04 NOTE — Transfer of Care (Signed)
Immediate Anesthesia Transfer of Care Note  Patient: Craig Martin  Procedure(s) Performed: Procedure(s): IRRIGATION AND DEBRIDEMENT RIGHT FOOT WOUND WITH PLACEMENT OF ACELL/VAC (Right)  Patient Location: PACU  Anesthesia Type:General  Level of Consciousness: awake, alert , oriented and patient cooperative  Airway & Oxygen Therapy: Patient Spontanous Breathing and Patient connected to face mask oxygen  Post-op Assessment: Report given to PACU RN, Post -op Vital signs reviewed and stable and Patient moving all extremities  Post vital signs: Reviewed and stable  Complications: No apparent anesthesia complications

## 2012-12-04 NOTE — H&P (View-Only) (Signed)
  Craig Martin  11/22/2012 11:30 AM   Office Visit  MRN:  [id number]  Department: [facility]  Dept Phone: [phone]  Description: Male DOB: 08/24/1926  Provider: Jarryd Gratz Sanger   Diagnoses    Melanoma (HCC)    -  Primary   172.9   Vitals - Last Recorded    158/78  93  1.753 m (5' 9")  80.287 kg (177 lb)  26.13 kg/m2  100%       Subjective:     Patient ID: Craig Martin is a 79 y.o. male.  HPI  The patient is an 79 yrs old bm here for follow up after excision of a melanoma on the right foot. He reports that he has left the nursing facility a couple of days ago.   The Acell has incorporated but the wound has increased odor today which is not typical of Acell treatment . The patient reports he has had some increased drainage from the wound. The wound bed is mostly pale pink, but there is some slough and necrotic tissue in the heel area. He is less tender than last week. He may be slightly more edematous, but he does report that he has not been keeping his legs up much since he returned home. I encouraged him to elevate his legs more.    He did see Dr. Burke Thompson this week and has developed some seroma of his left groin following lymph node resection and will see Dr. Thompson again in a couple of weeks.   The following portions of the patient's history were reviewed and updated as appropriate: allergies, current medications, past family history, past medical history, past social history, past surgical history and problem list.  Review of Systems    Objective:    Physical Exam  Cardiovascular: Normal rate.   Pulmonary/Chest: Effort normal. No respiratory distress.  Musculoskeletal:  Right foot- The Acell has incorporated but the wound has increased odor today which is not typical of Acell treatment . The patient reports he has had some increased drainage from the wound. The wound bed is mostly pale pink, but there is some slough and necrotic tissue in the heel area.       Assessment:     Melanoma (HCC)        Plan:     Plan for irrigation and debridement with placement of Acell and possible VAC.     

## 2012-12-05 ENCOUNTER — Telehealth: Payer: Self-pay | Admitting: Cardiovascular Disease

## 2012-12-05 LAB — GLUCOSE, CAPILLARY: Glucose-Capillary: 102 mg/dL — ABNORMAL HIGH (ref 70–99)

## 2012-12-05 MED ORDER — OXYCODONE-ACETAMINOPHEN 5-325 MG PO TABS: 1.0000 | ORAL_TABLET | Freq: Four times a day (QID) | ORAL | Status: DC | PRN

## 2012-12-05 MED ORDER — DSS 100 MG PO CAPS: 100.0000 mg | ORAL_CAPSULE | Freq: Two times a day (BID) | ORAL | Status: DC

## 2012-12-05 MED ORDER — ASPIRIN 81 MG PO TBEC: 81.0000 mg | DELAYED_RELEASE_TABLET | Freq: Every day | ORAL | Status: DC

## 2012-12-05 MED ORDER — METOPROLOL TARTRATE 25 MG PO TABS: 25.0000 mg | ORAL_TABLET | Freq: Two times a day (BID) | ORAL | Status: DC

## 2012-12-05 NOTE — Telephone Encounter (Signed)
I had a voice mail from Dr. Rennis Golden this am regarding this patient.  Was told to reschedule his stress test that the patient missed and schedule a follow up visit with Dr. Allyson Sabal.  I spoke with the patient and he wants me to call back in about at week.  He states that we are rushing him and he already has too much going on.  I will call him back at the end of next week.

## 2012-12-05 NOTE — Discharge Summary (Signed)
Agree with the above information 

## 2012-12-05 NOTE — Progress Notes (Signed)
Agree with the above information 

## 2012-12-05 NOTE — Progress Notes (Signed)
1 Day Post-Op  Subjective: Patient reports he is ready for discharge this am. He reports he has transportation home.  His pain is controlled with oral medications. He has had no further problems with NSVT and cardiology feels he is safe for discharge home.   Objective: Vital signs in last 24 hours: Temp:  [97.9 F (36.6 C)-98 F (36.7 C)] 98 F (36.7 C) (12/04 0415) Pulse Rate:  [68-91] 74 (12/04 0415) Resp:  [12-24] 20 (12/04 0415) BP: (123-162)/(52-100) 123/58 mmHg (12/04 0415) SpO2:  [98 %-100 %] 98 % (12/04 0415) Weight:  [77.883 kg (171 lb 11.2 oz)] 77.883 kg (171 lb 11.2 oz) (12/04 0015) Last BM Date: 12/04/12  Intake/Output from previous day: 12/03 0701 - 12/04 0700 In: 2200 [P.O.:200; I.V.:2000] Out: 1300 [Urine:1300] Intake/Output this shift:    General appearance: alert, cooperative and no distress The right foot VAC dressing is intact  Lab Results:   Recent Labs  12/04/12 1906 12/04/12 1923  WBC 5.7  --   HGB 8.4* 10.5*  HCT 25.7* 31.0*  PLT 239  --    BMET  Recent Labs  12/04/12 1338 12/04/12 1923  NA 142 141  K 3.5 3.3*  CL 103 103  GLUCOSE 133* 153*  BUN 25* 20  CREATININE 1.90* 1.80*   PT/INR No results found for this basename: LABPROT, INR,  in the last 72 hours ABG No results found for this basename: PHART, PCO2, PO2, HCO3,  in the last 72 hours  Studies/Results: No results found.  Anti-infectives: Anti-infectives   Start     Dose/Rate Route Frequency Ordered Stop   12/05/12 0600  ceFAZolin (ANCEF) IVPB 2 g/50 mL premix     2 g 100 mL/hr over 30 Minutes Intravenous On call to O.R. 12/04/12 1348 12/04/12 1534   12/04/12 1606  polymyxin B 500,000 Units, bacitracin 50,000 Units in sodium chloride irrigation 0.9 % 500 mL irrigation  Status:  Discontinued       As needed 12/04/12 1606 12/04/12 1643      Assessment/Plan: s/p Procedure(s): IRRIGATION AND DEBRIDEMENT RIGHT FOOT WOUND WITH PLACEMENT OF ACELL/VAC (Right) Discharge  LOS: 1 day    Craig Bialas,PA-C Plastic Surgery [phone]

## 2012-12-05 NOTE — Discharge Summary (Signed)
Physician Discharge Summary  Patient ID: Craig Martin MRN: [id number] DOB/AGE: July 15, 1926 79 y.o.  Admit date: 12/04/2012 Discharge date: 12/05/2012  Admission Diagnoses: Open wound of right foot following melanoma resection  Discharge Diagnoses:  Principal Problem:   NSVT (nonsustained ventricular tachycardia) Active Problems:   Chronic atrial fibrillation   Leg ulcer   Melanoma   Discharged Condition: stable  Hospital Course:   The patient is an 79 yrs old bm who was brought to the [facility] for repeat irrigation, debridement and surgical preparation of his open wound of the right foot with application of Acell. He underwent excision of a right foot melanoma and lymph node resection of the right groin per Dr. Violeta Gelinas several weeks ago and Dr. Kelly Splinter applied some Acell at that time.  We have been treating him as an outpatient, but his wound was continuing to have some necrotic tissue and drainage. He was brought back to the OR at the Outpatient surgery center, but developed some NSVT following the procedure. He was evaluated by cardiology and it was felt that this was similar to the previous asymptomatic episode of NSVT he had following his initial surgery. It was felt that he could be discharged home with follow up as an outpatient for nuclear stress testing.  He was admitted to the hospital overnight for observation and is stable and ready for discharge this morning.    Consults: cardiology- Evaluated for NSVT- will follow up at outpatient cardiology  Significant Diagnostic Studies: EKG with A-fib (chronic, LAFB, Right bundle morphology PVC's, suggesting LV origin.   Treatments: surgery:   Surgery Op Note Service date: 12/04/2012 4:38 PM  Operative Note   DATE OF OPERATION: 12/04/2012  LOCATION: [facility]  SURGICAL DIVISION: Plastic Surgery  PREOPERATIVE DIAGNOSES:  Right foot ulcer secondary to excision of  melanoma  POSTOPERATIVE DIAGNOSES:  same  PROCEDURE:  Placement of Acell and VAC after irrigation and debridement for preparation on right foot 6 x 7 cm.     Discharge Exam: Blood pressure 123/58, pulse 74, temperature 98 F (36.7 C), temperature source Oral, resp. rate 20, height 5\' 9"  (1.753 m), weight 77.883 kg (171 lb 11.2 oz), SpO2 98.00%. General appearance: alert, cooperative, appears stated age and no distress Resp: clear to auscultation bilaterally Cardio: irregularly irregular rhythm Right foot VAC dressing is intact and functional  Disposition: Home   Future Appointments Provider Department Dept Phone   12/11/2012 11:20 AM Liz Malady, MD [facility], [place] [phone]       Medication List         aspirin EC 81 MG tablet  Take 81 mg by mouth daily.     aspirin 81 MG EC tablet  Take 1 tablet (81 mg total) by mouth daily.     dabigatran 75 MG Caps capsule  Commonly known as:  PRADAXA  Take by mouth every 12 (twelve) hours.     DSS 100 MG Caps  Take 100 mg by mouth 2 (two) times daily.     DSS 100 MG Caps  Take 100 mg by mouth 2 (two) times daily.     furosemide 20 MG tablet  Commonly known as:  LASIX  Take 20 mg by mouth daily.     Heart Rate Monitor Misc  1 Device by Does not apply route daily. Wear for 2 weeks as directed     linagliptin 5 MG Tabs tablet  Commonly known as:  TRADJENTA  Take 5  mg by mouth daily.     losartan 25 MG tablet  Commonly known as:  COZAAR  Take 25 mg by mouth daily.     LUMIGAN 0.01 % Soln  Generic drug:  bimatoprost  Place 1 drop into both eyes at bedtime.     metoprolol tartrate 25 MG tablet  Commonly known as:  LOPRESSOR  Take 1 tablet (25 mg total) by mouth 2 (two) times daily.     metoprolol tartrate 25 MG tablet  Commonly known as:  LOPRESSOR  Take 1 tablet (25 mg total) by mouth 2 (two) times daily.     oxyCODONE-acetaminophen 5-325 MG per tablet  Commonly known as:  PERCOCET/ROXICET   Take 1-2 tablets by mouth every 6 (six) hours as needed for pain.     oxyCODONE-acetaminophen 5-325 MG per tablet  Commonly known as:  PERCOCET/ROXICET  Take 1-2 tablets by mouth every 6 (six) hours as needed for moderate pain.     potassium chloride SA 20 MEQ tablet  Commonly known as:  K-DUR,KLOR-CON  Take 1 tablet (20 mEq total) by mouth daily.     timolol 0.5 % ophthalmic solution  Commonly known as:  TIMOPTIC  Place 1 drop into both eyes daily.           Follow-up Information   Follow up with [facility]              In 1 week.   Contact information:   [address] [place] [address] [phone]      Follow up with [facility].   Contact information:   [address] [place] [address]       Follow up with [facility], DO On 12/10/2012. (Appointment for next Tuesday at 1:00pm)    Specialty:  Plastic Surgery   Contact information:   [address] [place] [address] [phone]       Signed: Franki Monte Plastic Surgery [phone]

## 2012-12-09 ENCOUNTER — Encounter (HOSPITAL_BASED_OUTPATIENT_CLINIC_OR_DEPARTMENT_OTHER): Payer: Self-pay | Admitting: Plastic Surgery

## 2012-12-11 ENCOUNTER — Encounter (INDEPENDENT_AMBULATORY_CARE_PROVIDER_SITE_OTHER): Payer: Self-pay | Admitting: General Surgery

## 2012-12-11 ENCOUNTER — Ambulatory Visit (INDEPENDENT_AMBULATORY_CARE_PROVIDER_SITE_OTHER): Payer: Medicare Other | Admitting: General Surgery

## 2012-12-11 VITALS — BP 130/86 | HR 79 | Temp 98.4°F | Resp 14

## 2012-12-11 DIAGNOSIS — C4371 Malignant melanoma of right lower limb, including hip: Secondary | ICD-10-CM

## 2012-12-11 DIAGNOSIS — C4370 Malignant melanoma of unspecified lower limb, including hip: Secondary | ICD-10-CM

## 2012-12-11 NOTE — Progress Notes (Signed)
Subjective:     Patient ID: Craig Martin, male   DOB: Oct 03, 1926, 79 y.o.   MRN: [id number]  HPI Presents status post right inguinal lymph node biopsy and wide excision melanoma right foot. He continues to undergo wound treatment by Dr. Kelly Splinter and has undergone another surgery on his foot. He is doing well. He is back home from skilled nursing and feeling better. He claims the drainage from his right groin has resolved.  Review of Systems     Objective:   Physical Exam Right groin wound has healed. No significant serous drainage, no evidence of infection. He does still have some palpable adenopathy. Right foot dressing is intact from Dr. Leonie Green office and I did not change that.    Assessment:     Doing well status post wide excision melanoma right foot and right inguinal lymph node biopsy.    Plan:     Oncology followup as scheduled. I will see him back in January. He'll continue to see Dr. Kelly Splinter regularly.

## 2012-12-16 ENCOUNTER — Encounter (HOSPITAL_BASED_OUTPATIENT_CLINIC_OR_DEPARTMENT_OTHER): Payer: Medicare Other | Attending: Plastic Surgery

## 2012-12-16 DIAGNOSIS — Z85820 Personal history of malignant melanoma of skin: Secondary | ICD-10-CM | POA: Insufficient documentation

## 2012-12-16 DIAGNOSIS — L97509 Non-pressure chronic ulcer of other part of unspecified foot with unspecified severity: Secondary | ICD-10-CM | POA: Insufficient documentation

## 2012-12-17 ENCOUNTER — Telehealth: Payer: Self-pay | Admitting: Cardiovascular Disease

## 2012-12-17 NOTE — Telephone Encounter (Signed)
I received a call from Dr. Rennis Golden stating that this patient was seen in the ER and that we needed to schedule his outpatient stress test that he had missed earlier.  I spoke with the patient on 12/04/12 and he asked that I call back later.  We were " moving too fast".  I spoke with Craig Martin again today and he does not want to schedule.

## 2012-12-31 NOTE — Progress Notes (Signed)
[facility]  NAME:  Craig Martin, Craig Martin NO.:  [id number]  MEDICAL RECORD NO.:  [id number]      DATE OF BIRTH:  February 23, 1926  PHYSICIAN:  Wayland Denis, DO       VISIT DATE:  12/30/2012                                  OFFICE VISIT   The patient is an 79 year old male who is here for followup on his right lower extremity foot chronic ulcer secondary to melanoma, which was excised.  He is doing extremely well.  The ACell has taken and we are going to continue with the [facility] and we will see him back in a week.     Wayland Denis, DO     CS/MEDQ  D:  12/30/2012  T:  12/31/2012  Job:  [id number]

## 2013-01-06 ENCOUNTER — Encounter (HOSPITAL_BASED_OUTPATIENT_CLINIC_OR_DEPARTMENT_OTHER): Payer: Medicare Other | Attending: Plastic Surgery

## 2013-01-06 DIAGNOSIS — L97509 Non-pressure chronic ulcer of other part of unspecified foot with unspecified severity: Secondary | ICD-10-CM | POA: Insufficient documentation

## 2013-01-06 DIAGNOSIS — C4370 Malignant melanoma of unspecified lower limb, including hip: Secondary | ICD-10-CM | POA: Insufficient documentation

## 2013-01-07 NOTE — Progress Notes (Signed)
[facility]  NAME:  Craig Martin, Craig Martin NO.:  [id number]  MEDICAL RECORD NO.:  [id number]      DATE OF BIRTH:  [birth year]  PHYSICIAN:  Theodoro Kos, DO       VISIT DATE:  01/06/2013                                  OFFICE VISIT   The patient is an 80 year old gentleman who is here for followup on his right foot ulcer secondary to excision of a melanoma.  He is doing very well.  The area is healing nicely.  There is no sign of infection.  We will add a collagen under the VAC today.  Next week, I will have him come to my office for placement of ACell and, then replace the VAC.  He acknowledges understanding and in agreement with this plan.     Theodoro Kos, DO     CS/MEDQ  D:  01/06/2013  T:  01/07/2013  Job:  [id number]

## 2013-01-11 ENCOUNTER — Inpatient Hospital Stay (HOSPITAL_COMMUNITY)
Admission: EM | Admit: 2013-01-11 | Discharge: 2013-01-12 | DRG: 292 | Disposition: A | Discharge status: Home / Self Care | Payer: Medicare Other | Attending: Internal Medicine | Admitting: Internal Medicine

## 2013-01-11 ENCOUNTER — Emergency Department (HOSPITAL_COMMUNITY): Payer: Medicare Other

## 2013-01-11 ENCOUNTER — Encounter (HOSPITAL_COMMUNITY): Payer: Self-pay | Admitting: Emergency Medicine

## 2013-01-11 DIAGNOSIS — I482 Chronic atrial fibrillation, unspecified: Secondary | ICD-10-CM

## 2013-01-11 DIAGNOSIS — Z833 Family history of diabetes mellitus: Secondary | ICD-10-CM

## 2013-01-11 DIAGNOSIS — E876 Hypokalemia: Secondary | ICD-10-CM

## 2013-01-11 DIAGNOSIS — Z7901 Long term (current) use of anticoagulants: Secondary | ICD-10-CM

## 2013-01-11 DIAGNOSIS — C439 Malignant melanoma of skin, unspecified: Secondary | ICD-10-CM

## 2013-01-11 DIAGNOSIS — I998 Other disorder of circulatory system: Secondary | ICD-10-CM | POA: Diagnosis present

## 2013-01-11 DIAGNOSIS — H409 Unspecified glaucoma: Secondary | ICD-10-CM | POA: Diagnosis present

## 2013-01-11 DIAGNOSIS — R590 Localized enlarged lymph nodes: Secondary | ICD-10-CM

## 2013-01-11 DIAGNOSIS — E871 Hypo-osmolality and hyponatremia: Secondary | ICD-10-CM

## 2013-01-11 DIAGNOSIS — C4371 Malignant melanoma of right lower limb, including hip: Secondary | ICD-10-CM

## 2013-01-11 DIAGNOSIS — I2510 Atherosclerotic heart disease of native coronary artery without angina pectoris: Secondary | ICD-10-CM

## 2013-01-11 DIAGNOSIS — Z993 Dependence on wheelchair: Secondary | ICD-10-CM

## 2013-01-11 DIAGNOSIS — M129 Arthropathy, unspecified: Secondary | ICD-10-CM | POA: Diagnosis present

## 2013-01-11 DIAGNOSIS — E785 Hyperlipidemia, unspecified: Secondary | ICD-10-CM

## 2013-01-11 DIAGNOSIS — I4729 Other ventricular tachycardia: Secondary | ICD-10-CM

## 2013-01-11 DIAGNOSIS — Z79899 Other long term (current) drug therapy: Secondary | ICD-10-CM

## 2013-01-11 DIAGNOSIS — I472 Ventricular tachycardia: Secondary | ICD-10-CM

## 2013-01-11 DIAGNOSIS — N400 Enlarged prostate without lower urinary tract symptoms: Secondary | ICD-10-CM | POA: Diagnosis present

## 2013-01-11 DIAGNOSIS — D649 Anemia, unspecified: Secondary | ICD-10-CM

## 2013-01-11 DIAGNOSIS — Z8673 Personal history of transient ischemic attack (TIA), and cerebral infarction without residual deficits: Secondary | ICD-10-CM

## 2013-01-11 DIAGNOSIS — I5021 Acute systolic (congestive) heart failure: Secondary | ICD-10-CM | POA: Diagnosis present

## 2013-01-11 DIAGNOSIS — R0601 Orthopnea: Secondary | ICD-10-CM

## 2013-01-11 DIAGNOSIS — Z9119 Patient's noncompliance with other medical treatment and regimen: Secondary | ICD-10-CM

## 2013-01-11 DIAGNOSIS — Z85820 Personal history of malignant melanoma of skin: Secondary | ICD-10-CM

## 2013-01-11 DIAGNOSIS — E119 Type 2 diabetes mellitus without complications: Secondary | ICD-10-CM

## 2013-01-11 DIAGNOSIS — I4891 Unspecified atrial fibrillation: Secondary | ICD-10-CM

## 2013-01-11 DIAGNOSIS — I5033 Acute on chronic diastolic (congestive) heart failure: Principal | ICD-10-CM | POA: Diagnosis present

## 2013-01-11 DIAGNOSIS — I509 Heart failure, unspecified: Secondary | ICD-10-CM

## 2013-01-11 DIAGNOSIS — I5031 Acute diastolic (congestive) heart failure: Secondary | ICD-10-CM

## 2013-01-11 DIAGNOSIS — Z7982 Long term (current) use of aspirin: Secondary | ICD-10-CM

## 2013-01-11 DIAGNOSIS — Z9861 Coronary angioplasty status: Secondary | ICD-10-CM

## 2013-01-11 DIAGNOSIS — N289 Disorder of kidney and ureter, unspecified: Secondary | ICD-10-CM

## 2013-01-11 DIAGNOSIS — L97909 Non-pressure chronic ulcer of unspecified part of unspecified lower leg with unspecified severity: Secondary | ICD-10-CM

## 2013-01-11 DIAGNOSIS — D500 Iron deficiency anemia secondary to blood loss (chronic): Secondary | ICD-10-CM | POA: Diagnosis present

## 2013-01-11 DIAGNOSIS — Z87891 Personal history of nicotine dependence: Secondary | ICD-10-CM

## 2013-01-11 DIAGNOSIS — I70229 Atherosclerosis of native arteries of extremities with rest pain, unspecified extremity: Secondary | ICD-10-CM

## 2013-01-11 DIAGNOSIS — I10 Essential (primary) hypertension: Secondary | ICD-10-CM

## 2013-01-11 DIAGNOSIS — Z91199 Patient's noncompliance with other medical treatment and regimen due to unspecified reason: Secondary | ICD-10-CM

## 2013-01-11 DIAGNOSIS — R609 Edema, unspecified: Secondary | ICD-10-CM | POA: Diagnosis present

## 2013-01-11 LAB — BASIC METABOLIC PANEL
BUN: 19 mg/dL (ref 6–23)
CALCIUM: 7.5 mg/dL — AB (ref 8.4–10.5)
CO2: 19 mEq/L (ref 19–32)
Chloride: 102 mEq/L (ref 96–112)
Creatinine, Ser: 1.48 mg/dL — ABNORMAL HIGH (ref 0.50–1.35)
GFR calc Af Amer: 48 mL/min — ABNORMAL LOW (ref >90)
GFR, EST NON AFRICAN AMERICAN: 42 mL/min — AB (ref >90)
Glucose, Bld: 159 mg/dL — ABNORMAL HIGH (ref 70–99)
Potassium: 3.3 mEq/L — ABNORMAL LOW (ref 3.7–5.3)
SODIUM: 136 meq/L — AB (ref 137–147)

## 2013-01-11 LAB — GLUCOSE, CAPILLARY
GLUCOSE-CAPILLARY: 121 mg/dL — AB (ref 70–99)
GLUCOSE-CAPILLARY: 147 mg/dL — AB (ref 70–99)
GLUCOSE-CAPILLARY: 131 mg/dL — AB (ref 70–99)
Glucose-Capillary: 108 mg/dL — ABNORMAL HIGH (ref 70–99)

## 2013-01-11 LAB — CBC WITH DIFFERENTIAL/PLATELET
BASOS ABS: 0 10*3/uL (ref 0.0–0.1)
Basophils Relative: 0 % (ref 0–1)
EOS ABS: 0.1 10*3/uL (ref 0.0–0.7)
Eosinophils Relative: 2 % (ref 0–5)
HCT: 22.6 % — ABNORMAL LOW (ref 39.0–52.0)
Hemoglobin: 7.6 g/dL — ABNORMAL LOW (ref 13.0–17.0)
Lymphocytes Relative: 33 % (ref 12–46)
Lymphs Abs: 1.9 10*3/uL (ref 0.7–4.0)
MCH: 29.7 pg (ref 26.0–34.0)
MCHC: 33.6 g/dL (ref 30.0–36.0)
MCV: 88.3 fL (ref 78.0–100.0)
Monocytes Absolute: 0.3 10*3/uL (ref 0.1–1.0)
Monocytes Relative: 5 % (ref 3–12)
Neutro Abs: 3.6 10*3/uL (ref 1.7–7.7)
Neutrophils Relative %: 61 % (ref 43–77)
PLATELETS: 208 10*3/uL (ref 150–400)
RBC: 2.56 MIL/uL — ABNORMAL LOW (ref 4.22–5.81)
RDW: 15.8 % — AB (ref 11.5–15.5)
WBC: 5.9 10*3/uL (ref 4.0–10.5)

## 2013-01-11 LAB — OCCULT BLOOD X 1 CARD TO LAB, STOOL: FECAL OCCULT BLD: NEGATIVE

## 2013-01-11 LAB — TROPONIN I

## 2013-01-11 LAB — PRO B NATRIURETIC PEPTIDE: PRO B NATRI PEPTIDE: 6537 pg/mL — AB (ref 0–450)

## 2013-01-11 MED ORDER — ONDANSETRON HCL 4 MG/2ML IJ SOLN: 4.0000 mg | Freq: Four times a day (QID) | INTRAMUSCULAR | Status: DC | PRN

## 2013-01-11 MED ORDER — SODIUM CHLORIDE 0.9 % IJ SOLN: 3.0000 mL | INTRAMUSCULAR | Status: DC | PRN

## 2013-01-11 MED ORDER — FUROSEMIDE 10 MG/ML IJ SOLN
40.0000 mg | Freq: Once | INTRAMUSCULAR | Status: AC
  Administered 2013-01-11: 40 mg via INTRAVENOUS
  Filled 2013-01-11: qty 4

## 2013-01-11 MED ORDER — OXYCODONE-ACETAMINOPHEN 5-325 MG PO TABS
1.0000 | ORAL_TABLET | Freq: Four times a day (QID) | ORAL | Status: DC | PRN
  Administered 2013-01-11: 1 via ORAL
  Administered 2013-01-12: 2 via ORAL
  Filled 2013-01-11: qty 2
  Filled 2013-01-11: qty 1

## 2013-01-11 MED ORDER — FUROSEMIDE 10 MG/ML IJ SOLN
40.0000 mg | Freq: Two times a day (BID) | INTRAMUSCULAR | Status: DC
  Administered 2013-01-11 – 2013-01-12 (×3): 40 mg via INTRAVENOUS
  Filled 2013-01-11 (×5): qty 4

## 2013-01-11 MED ORDER — LOSARTAN POTASSIUM 25 MG PO TABS
25.0000 mg | ORAL_TABLET | Freq: Every day | ORAL | Status: DC
  Administered 2013-01-11 – 2013-01-12 (×2): 25 mg via ORAL
  Filled 2013-01-11 (×2): qty 1

## 2013-01-11 MED ORDER — SODIUM CHLORIDE 0.9 % IV SOLN: 250.0000 mL | INTRAVENOUS | Status: DC | PRN

## 2013-01-11 MED ORDER — METOPROLOL TARTRATE 25 MG PO TABS
25.0000 mg | ORAL_TABLET | Freq: Two times a day (BID) | ORAL | Status: DC
  Administered 2013-01-11 – 2013-01-12 (×3): 25 mg via ORAL
  Filled 2013-01-11 (×4): qty 1

## 2013-01-11 MED ORDER — LINAGLIPTIN 5 MG PO TABS
5.0000 mg | ORAL_TABLET | Freq: Every day | ORAL | Status: DC
  Administered 2013-01-11 – 2013-01-12 (×2): 5 mg via ORAL
  Filled 2013-01-11 (×2): qty 1

## 2013-01-11 MED ORDER — TIMOLOL MALEATE 0.5 % OP SOLN
1.0000 [drp] | Freq: Every day | OPHTHALMIC | Status: DC
  Administered 2013-01-11 – 2013-01-12 (×2): 1 [drp] via OPHTHALMIC
  Filled 2013-01-11: qty 5

## 2013-01-11 MED ORDER — LATANOPROST 0.005 % OP SOLN
1.0000 [drp] | Freq: Every day | OPHTHALMIC | Status: DC
  Administered 2013-01-11: 22:00:00 1 [drp] via OPHTHALMIC
  Filled 2013-01-11: qty 2.5

## 2013-01-11 MED ORDER — DABIGATRAN ETEXILATE MESYLATE 75 MG PO CAPS
75.0000 mg | ORAL_CAPSULE | Freq: Two times a day (BID) | ORAL | Status: DC
  Administered 2013-01-11 – 2013-01-12 (×3): 75 mg via ORAL
  Filled 2013-01-11 (×4): qty 1

## 2013-01-11 MED ORDER — SODIUM CHLORIDE 0.9 % IJ SOLN
3.0000 mL | Freq: Two times a day (BID) | INTRAMUSCULAR | Status: DC
  Administered 2013-01-11 – 2013-01-12 (×3): 3 mL via INTRAVENOUS

## 2013-01-11 MED ORDER — ACETAMINOPHEN 325 MG PO TABS: 650.0000 mg | ORAL_TABLET | ORAL | Status: DC | PRN

## 2013-01-11 MED ORDER — POTASSIUM CHLORIDE CRYS ER 20 MEQ PO TBCR
40.0000 meq | EXTENDED_RELEASE_TABLET | Freq: Once | ORAL | Status: AC
  Administered 2013-01-11: 40 meq via ORAL
  Filled 2013-01-11: qty 2

## 2013-01-11 MED ORDER — POTASSIUM CHLORIDE CRYS ER 20 MEQ PO TBCR
20.0000 meq | EXTENDED_RELEASE_TABLET | Freq: Every day | ORAL | Status: DC
  Administered 2013-01-11 – 2013-01-12 (×2): 20 meq via ORAL
  Filled 2013-01-11 (×2): qty 1

## 2013-01-11 MED ORDER — DOCUSATE SODIUM 100 MG PO CAPS
100.0000 mg | ORAL_CAPSULE | Freq: Two times a day (BID) | ORAL | Status: DC
  Administered 2013-01-11 – 2013-01-12 (×3): 100 mg via ORAL
  Filled 2013-01-11 (×4): qty 1

## 2013-01-11 NOTE — Progress Notes (Signed)
Educated patient on ways to help signs and symptoms of heart failure. Encouraged patient to watch heart failure video but refused. HF booklet given to patient and encouraged him to go back over after giving to the patient and he understood. Will continue to monitor to end of shift.

## 2013-01-11 NOTE — H&P (Signed)
[facility] History and Physical  Alanson Hausmann WUJ:[id number] DOB: 14-Nov-1926 DOA: 01/11/2013  Referring physician: EDP PCP: [name], MD   Chief Complaint: Orthopnea   HPI: Craig Martin is a 80 y.o. male who presents to the ED with c/o SOB.  His symptoms have slowly and progressively worsened for the past couple of weeks.  He goes on to explain that his SOB only occurs when lying down.  He is able to breath normally when sitting up.  He also reports that his BLE edema is new, and states that he recently ran out of medications.  On further questioning and examination of his pill bottle, his lasix script was filled on 1/7, and before that he states he had been out of it for some time "about a week or two".  Review of Systems: Systems reviewed.  As above, otherwise negative  Past Medical History  Diagnosis Date  . Glaucoma   . Bradycardia   . Diabetes mellitus   . Hyperlipidemia   . Hypertension   . Shortness of breath     WITH EXERTION  . Stroke     "I had a stroke many yrs ago" -no residual problems  . BPH (benign prostatic hyperplasia)   . Difficulty voiding   . Dysrhythmia     AFIB  - TAKES PRADAXA FOLLOWED BY DR. Gwenlyn Found  . Critical lower limb ischemia   . Melanoma   . Arthritis   . Coronary artery disease    Past Surgical History  Procedure Laterality Date  . Stented coronary artery  2004  . Transurethral resection of prostate  01/24/2012    Procedure: TRANSURETHRAL RESECTION OF THE PROSTATE WITH GYRUS INSTRUMENTS;  Surgeon: Alexis Frock, MD;  Location: [facility];  Service: Urology;  Laterality: N/A;  . Cardiac catheterization  09/19/2002    moderate LAD and RCA and normal LV function  . Nm myocar perf wall motion  05/24/2011    showed inferior scar w/o ischemia  . Doppler echocardiography  05/24/2011    EF >55%  . Doppler echocardiography  09/28/2009    EF >55% lvh w/ left atrial enlargement,diastolic dysfunctio,elevated lv filling pressure  . Nm  myocar perf wall motion  09/09/2009    mild ischemia mid anterior,apical anterior and basal inferolateral  . Nm myocar perf wall motion  12/04/2002  . Nm myocar perf wall motion  05/21/2002  . Cardiac catheterization  07/03/2002  . Cataract extraction    . Melanoma excision Right 10/21/2012    Procedure: MELANOMA EXCISION;  Surgeon: Zenovia Jarred, MD;  Location: [place];  Service: General;  Laterality: Right;  . Inguinal lymph node biopsy Right 10/21/2012    Procedure: INGUINAL LYMPH NODE BIOPSY;  Surgeon: Zenovia Jarred, MD;  Location: [place];  Service: General;  Laterality: Right;  . Application of a-cell of extremity Right 10/21/2012    Procedure: APPLICATION OF A-CELL OF EXTREMITY;  Surgeon: Theodoro Kos, DO;  Location: [place];  Service: Plastics;  Laterality: Right;  . Incision and drainage of wound Right 12/04/2012    Procedure: IRRIGATION AND DEBRIDEMENT RIGHT FOOT WOUND WITH PLACEMENT OF ACELL/VAC;  Surgeon: Theodoro Kos, DO;  Location: [place];  Service: Plastics;  Laterality: Right;   Social History:  reports that he quit smoking about 27 years ago. His smoking use included Cigarettes. He smoked 1.50 packs per day. He has quit using smokeless tobacco. He reports that he does not drink alcohol or use illicit drugs.  No Known Allergies  Family History  Problem Relation Age of Onset  . Diabetes Mother   . Diabetes Brother      Prior to Admission medications   Medication Sig Start Date End Date Taking? Authorizing Provider  dabigatran (PRADAXA) 75 MG CAPS Take 75 mg by mouth 2 (two) times daily.    Yes Historical Provider, MD  furosemide (LASIX) 20 MG tablet Take 20 mg by mouth daily.   Yes Historical Provider, MD  linagliptin (TRADJENTA) 5 MG TABS tablet Take 5 mg by mouth daily.   Yes Historical Provider, MD  losartan (COZAAR) 25 MG tablet Take 25 mg by mouth daily.   Yes Historical Provider, MD  bimatoprost (LUMIGAN) 0.01 % SOLN Place 1 drop into both eyes  at bedtime.    Historical Provider, MD  docusate sodium 100 MG CAPS Take 100 mg by mouth 2 (two) times daily. 12/05/12   Shawn Rayburn, PA-C  metoprolol tartrate (LOPRESSOR) 25 MG tablet Take 1 tablet (25 mg total) by mouth 2 (two) times daily. 12/05/12   Shawn Rayburn, PA-C  Misc. Devices (HEART RATE MONITOR) MISC 1 Device by Does not apply route daily. Wear for 2 weeks as directed 10/24/12   Lyda Jester, PA-C  oxyCODONE-acetaminophen (PERCOCET/ROXICET) 5-325 MG per tablet Take 1-2 tablets by mouth every 6 (six) hours as needed for moderate pain. 12/05/12   Shawn Rayburn, PA-C  potassium chloride SA (K-DUR,KLOR-CON) 20 MEQ tablet Take 1 tablet (20 mEq total) by mouth daily. 12/04/12   Jasper Riling. Pickering, MD  timolol (TIMOPTIC) 0.5 % ophthalmic solution Place 1 drop into both eyes daily.  08/30/12   Historical Provider, MD   Physical Exam: Filed Vitals:   01/11/13 0131  BP: 151/82  Pulse: 99  Temp: 97.6 F (36.4 C)  Resp: 14    BP 151/82  Pulse 99  Temp(Src) 97.6 F (36.4 C)  Resp 14  SpO2 94%  General Appearance:    Alert, oriented, no distress, appears stated age  Head:    Normocephalic, atraumatic  Eyes:    PERRL, EOMI, sclera non-icteric        Nose:   Nares without drainage or epistaxis. Mucosa, turbinates normal  Throat:   Moist mucous membranes. Oropharynx without erythema or exudate.  Neck:   Supple. No carotid bruits.  No thyromegaly.  No lymphadenopathy.   Back:     No CVA tenderness, no spinal tenderness  Lungs:     Clear to auscultation bilaterally, without wheezes, rhonchi or rales  Chest wall:    No tenderness to palpitation  Heart:    Regular rate and rhythm without murmurs, gallops, rubs  Abdomen:     Soft, non-tender, nondistended, normal bowel sounds, no organomegaly  Genitalia:    deferred  Rectal:    deferred  Extremities:   No clubbing, cyanosis or edema.  Pulses:   2+ and symmetric all extremities  Skin:   BLE edema.  Lymph nodes:   Cervical,  supraclavicular, and axillary nodes normal  Neurologic:   CNII-XII intact. Normal strength, sensation and reflexes      throughout    Labs on Admission:  Basic Metabolic Panel:  Recent Labs Lab 01/11/13 0145  NA 136*  K 3.3*  CL 102  CO2 19  GLUCOSE 159*  BUN 19  CREATININE 1.48*  CALCIUM 7.5*   Liver Function Tests: No results found for this basename: AST, ALT, ALKPHOS, BILITOT, PROT, ALBUMIN,  in the last 168 hours No results found for this basename: LIPASE, AMYLASE,  in the last 168 hours No results found for this basename: AMMONIA,  in the last 168 hours CBC:  Recent Labs Lab 01/11/13 0145  WBC 5.9  NEUTROABS 3.6  HGB 7.6*  HCT 22.6*  MCV 88.3  PLT 208   Cardiac Enzymes:  Recent Labs Lab 01/11/13 0145  TROPONINI <0.30    BNP (last 3 results)  Recent Labs  01/11/13 0145  PROBNP 6537.0*   CBG: No results found for this basename: GLUCAP,  in the last 168 hours  Radiological Exams on Admission: Dg Chest Port 1 View  01/11/2013   CLINICAL DATA:  Dyspnea on exertion.  EXAM: PORTABLE CHEST - 1 VIEW  COMPARISON:  PA and lateral chest 01/17/2012.  FINDINGS: There is cardiomegaly without edema. Lungs are clear. No pneumothorax or pleural effusion.  IMPRESSION: Cardiomegaly without acute disease.   Electronically Signed   By: Inge Rise M.D.   On: 01/11/2013 02:09    EKG: Independently reviewed.  Assessment/Plan Principal Problem:   Acute diastolic CHF (congestive heart failure) Active Problems:   Leg ulcer   Orthopnea   1. Acute diastolic CHF - likely due to running out of medications at home including lasix.  Diuresing patient at this time. 2. Anemia - Recheck HGB in AM, secondary to blood loss from wound vs other source.  Also checking stool for occult blood, but no frank GIB at this time.  Given lack of pulmonary findings on CXR, clear relation of symptoms to missing medications, and subacute onset, want to hold off on transfusion at this  time. 3. Leg ulcer - wound care to evaluate patient  Code Status: Full Code  Family Communication: No family in room Disposition Plan: Admit to obs   Time spent: 70 min  GARDNER, JARED M. [facility] Pager [phone]  If 7AM-7PM, please contact the day team taking care of the patient [email or web address] Password [email or web address] 01/11/2013, 3:09 AM

## 2013-01-11 NOTE — Progress Notes (Signed)
Pt a/o, c/o right foot pain, PRN percocet given as ordered, wound vac intact, pts nephew brought charger for home wound vac, vss, pt stable

## 2013-01-11 NOTE — ED Notes (Signed)
MD at bedside. 

## 2013-01-11 NOTE — Plan of Care (Signed)
Problem: Phase I Progression Outcomes Goal: EF % per last Echo/documented,Core Reminder form on chart Outcome: Completed/Met Date Met:  01/11/13 EF 55 - 60 %

## 2013-01-11 NOTE — ED Provider Notes (Signed)
CSN: [id number]     Arrival date & time 01/11/13  0118 History   First MD Initiated Contact with Patient 01/11/13 0133     Chief Complaint  Patient presents with  . Shortness of Breath   (Consider location/radiation/quality/duration/timing/severity/associated sxs/prior Treatment) Patient is a 80 y.o. male presenting with shortness of breath. The history is provided by the patient.  Shortness of Breath He is a somewhat poor and vague historian, but he developed dyspnea when he rolled over in bed tonight. This happened several other times. He denies chest pain, heaviness, tightness, pressure. He has not noted exertional dyspnea but he is confined to a wheelchair. He states that he does not get dyspneic when he transfers to wheelchair. He states his brother has noted that he seems dyspneic when he is sleeping but has not had any paroxysmal nocturnal dyspnea. He has not noted any orthopnea. He is status post surgery on his right heel and has not been ambulatory since then. He is maintained on an anticoagulant.  Past Medical History  Diagnosis Date  . Glaucoma   . Bradycardia   . Diabetes mellitus   . Hyperlipidemia   . Hypertension   . Shortness of breath     WITH EXERTION  . Stroke     "I had a stroke many yrs ago" -no residual problems  . BPH (benign prostatic hyperplasia)   . Difficulty voiding   . Dysrhythmia     AFIB  - TAKES PRADAXA FOLLOWED BY DR. Gwenlyn Found  . Critical lower limb ischemia   . Melanoma   . Arthritis   . Coronary artery disease    Past Surgical History  Procedure Laterality Date  . Stented coronary artery  2004  . Transurethral resection of prostate  01/24/2012    Procedure: TRANSURETHRAL RESECTION OF THE PROSTATE WITH GYRUS INSTRUMENTS;  Surgeon: Alexis Frock, MD;  Location: [facility];  Service: Urology;  Laterality: N/A;  . Cardiac catheterization  09/19/2002    moderate LAD and RCA and normal LV function  . Nm myocar perf wall motion  05/24/2011    showed inferior  scar w/o ischemia  . Doppler echocardiography  05/24/2011    EF >55%  . Doppler echocardiography  09/28/2009    EF >55% lvh w/ left atrial enlargement,diastolic dysfunctio,elevated lv filling pressure  . Nm myocar perf wall motion  09/09/2009    mild ischemia mid anterior,apical anterior and basal inferolateral  . Nm myocar perf wall motion  12/04/2002  . Nm myocar perf wall motion  05/21/2002  . Cardiac catheterization  07/03/2002  . Cataract extraction    . Melanoma excision Right 10/21/2012    Procedure: MELANOMA EXCISION;  Surgeon: Zenovia Jarred, MD;  Location: [place];  Service: General;  Laterality: Right;  . Inguinal lymph node biopsy Right 10/21/2012    Procedure: INGUINAL LYMPH NODE BIOPSY;  Surgeon: Zenovia Jarred, MD;  Location: [place];  Service: General;  Laterality: Right;  . Application of a-cell of extremity Right 10/21/2012    Procedure: APPLICATION OF A-CELL OF EXTREMITY;  Surgeon: Theodoro Kos, DO;  Location: [place];  Service: Plastics;  Laterality: Right;  . Incision and drainage of wound Right 12/04/2012    Procedure: IRRIGATION AND DEBRIDEMENT RIGHT FOOT WOUND WITH PLACEMENT OF ACELL/VAC;  Surgeon: Theodoro Kos, DO;  Location: [place];  Service: Plastics;  Laterality: Right;   Family History  Problem Relation Age of Onset  . Diabetes Mother   . Diabetes Brother  History  Substance Use Topics  . Smoking status: Former Smoker -- 1.50 packs/day    Types: Cigarettes    Quit date: 01/16/1986  . Smokeless tobacco: Former Systems developer  . Alcohol Use: No    Review of Systems  Respiratory: Positive for shortness of breath.   All other systems reviewed and are negative.    Allergies  Review of patient's allergies indicates no known allergies.  Home Medications   Current Outpatient Rx  Name  Route  Sig  Dispense  Refill  . aspirin EC 81 MG EC tablet   Oral   Take 1 tablet (81 mg total) by mouth daily.         Marland Kitchen aspirin EC 81 MG tablet    Oral   Take 81 mg by mouth daily.         . bimatoprost (LUMIGAN) 0.01 % SOLN   Both Eyes   Place 1 drop into both eyes at bedtime.         . dabigatran (PRADAXA) 75 MG CAPS   Oral   Take by mouth every 12 (twelve) hours.         . docusate sodium 100 MG CAPS   Oral   Take 100 mg by mouth 2 (two) times daily.   60 capsule   1   . docusate sodium 100 MG CAPS   Oral   Take 100 mg by mouth 2 (two) times daily.   10 capsule   0   . furosemide (LASIX) 20 MG tablet   Oral   Take 20 mg by mouth daily.         Marland Kitchen linagliptin (TRADJENTA) 5 MG TABS tablet   Oral   Take 5 mg by mouth daily.         Marland Kitchen losartan (COZAAR) 25 MG tablet   Oral   Take 25 mg by mouth daily.         . metoprolol tartrate (LOPRESSOR) 25 MG tablet   Oral   Take 1 tablet (25 mg total) by mouth 2 (two) times daily.   30 tablet   1   . metoprolol tartrate (LOPRESSOR) 25 MG tablet   Oral   Take 1 tablet (25 mg total) by mouth 2 (two) times daily.   60 tablet   1   . Misc. Devices (HEART RATE MONITOR) MISC   Does not apply   1 Device by Does not apply route daily. Wear for 2 weeks as directed   1 each   0   . oxyCODONE-acetaminophen (PERCOCET/ROXICET) 5-325 MG per tablet   Oral   Take 1-2 tablets by mouth every 6 (six) hours as needed for pain.   24 tablet   0   . oxyCODONE-acetaminophen (PERCOCET/ROXICET) 5-325 MG per tablet   Oral   Take 1-2 tablets by mouth every 6 (six) hours as needed for moderate pain.   30 tablet   0   . potassium chloride SA (K-DUR,KLOR-CON) 20 MEQ tablet   Oral   Take 1 tablet (20 mEq total) by mouth daily.   14 tablet   0   . timolol (TIMOPTIC) 0.5 % ophthalmic solution   Both Eyes   Place 1 drop into both eyes daily.           BP 151/82  Pulse 99  Temp(Src) 97.6 F (36.4 C)  Resp 14  SpO2 94% Physical Exam  Nursing note and vitals reviewed.  80 year old male, resting comfortably and in  no acute distress. Vital signs are significant for  hypertension with blood pressure 151/82. Oxygen saturation is 94%, which is normal. Head is normocephalic and atraumatic. PERRLA, EOMI. Oropharynx is clear. Neck is nontender and supple without adenopathy. JVD is present at 90. Back is nontender and there is no CVA tenderness. Lungs are clear without rales, wheezes, or rhonchi. Chest is nontender. Heart has has an irregular rhythm without murmur. Abdomen is soft, flat, nontender without masses or hepatosplenomegaly and peristalsis is normoactive. Extremities: Right leg is in a boot which is not taken off. There is 2+ pitting edema bilaterally. Right calf circumferences 1 cm greater than left calf circumference. There is also 1-2+ presacral edema. Skin is warm and dry without rash. Neurologic: Mental status is normal, cranial nerves are intact, there are no motor or sensory deficits.  ED Course  Procedures (including critical care time) Labs Review Results for orders placed during the hospital encounter of 01/11/13  CBC WITH DIFFERENTIAL      Result Value Range   WBC 5.9  4.0 - 10.5 K/uL   RBC 2.56 (*) 4.22 - 5.81 MIL/uL   Hemoglobin 7.6 (*) 13.0 - 17.0 g/dL   HCT 22.6 (*) 39.0 - 52.0 %   MCV 88.3  78.0 - 100.0 fL   MCH 29.7  26.0 - 34.0 pg   MCHC 33.6  30.0 - 36.0 g/dL   RDW 15.8 (*) 11.5 - 15.5 %   Platelets 208  150 - 400 K/uL   Neutrophils Relative % 61  43 - 77 %   Neutro Abs 3.6  1.7 - 7.7 K/uL   Lymphocytes Relative 33  12 - 46 %   Lymphs Abs 1.9  0.7 - 4.0 K/uL   Monocytes Relative 5  3 - 12 %   Monocytes Absolute 0.3  0.1 - 1.0 K/uL   Eosinophils Relative 2  0 - 5 %   Eosinophils Absolute 0.1  0.0 - 0.7 K/uL   Basophils Relative 0  0 - 1 %   Basophils Absolute 0.0  0.0 - 0.1 K/uL  BASIC METABOLIC PANEL      Result Value Range   Sodium 136 (*) 137 - 147 mEq/L   Potassium 3.3 (*) 3.7 - 5.3 mEq/L   Chloride 102  96 - 112 mEq/L   CO2 19  19 - 32 mEq/L   Glucose, Bld 159 (*) 70 - 99 mg/dL   BUN 19  6 - 23 mg/dL    Creatinine, Ser 1.48 (*) 0.50 - 1.35 mg/dL   Calcium 7.5 (*) 8.4 - 10.5 mg/dL   GFR calc non Af Amer 42 (*) >90 mL/min   GFR calc Af Amer 48 (*) >90 mL/min  PRO B NATRIURETIC PEPTIDE      Result Value Range   Pro B Natriuretic peptide (BNP) 6537.0 (*) 0 - 450 pg/mL  TROPONIN I      Result Value Range   Troponin I <0.30  <0.30 ng/mL   Imaging Review Dg Chest Port 1 View  01/11/2013   CLINICAL DATA:  Dyspnea on exertion.  EXAM: PORTABLE CHEST - 1 VIEW  COMPARISON:  PA and lateral chest 01/17/2012.  FINDINGS: There is cardiomegaly without edema. Lungs are clear. No pneumothorax or pleural effusion.  IMPRESSION: Cardiomegaly without acute disease.   Electronically Signed   By: Inge Rise M.D.   On: 01/11/2013 02:09    EKG Interpretation    Date/Time:  Saturday January 11 2013 01:26:42 EST Ventricular Rate:  108  PR Interval:    QRS Duration: 88 QT Interval:  366 QTC Calculation: 491 R Axis:   -51 Text Interpretation:  Age not entered, assumed to be  80 years old for purpose of ECG interpretation Atrial fibrillation with  Premature ventricular complexes Left anterior fasicular block LVH with secondary repolarization abnormality Inferior infarct, old When compared with ECG of 12/04/2012, No significant change was found Confirmed by [facility]  MD, Shruthi Northrup ([id number]) on 01/11/2013 1:44:35 AM            MDM   1. CHF exacerbation   2. Anemia   3. Renal insufficiency   4. Hypokalemia   5. Hyponatremia    Dyspnea which seems most likely to be due due to congestive heart failure given his edema and neck vein distention. Chest x-ray and BNP will be checked. He ECG shows atrial fibrillation which is chronic. He would be at risk for pulmonary embolism but he is on an anticoagulant.  BNP has come back significantly elevated. Chest x-ray shows cardiomegaly without pulmonary vascular congestion. Hemoglobin has come back with a 3 g drop although current level is only about 1 g below what appears to  be his baseline. Some of his CHF may be related to low hemoglobin. He'll need to be evaluated for possible GI blood loss. He is given a dose of furosemide. Case is discussed with Dr. Alcario Drought of [facility] who agrees to come and evaluate the patient in the ED.  Delora Fuel, MD 32/99/24 [id number]

## 2013-01-11 NOTE — ED Notes (Signed)
Dr. Alcario Drought (hospitalist) at bedside to discuss admission with pt.

## 2013-01-11 NOTE — Progress Notes (Signed)
Patient transferred to room 4E16 via stretcher from emergency department. Oriented patient to room and equipment and to heart floor. Currently at this time, patient states no pain when asked. Patient weighed on bed scale due to having on a wound boot to secure wound vac and would weigh more than actual weight if stood on one leg on standing scale. Therefore will start over with bed scale weight and has been document. Will continue to monitor patient to end of shift.

## 2013-01-11 NOTE — Progress Notes (Addendum)
Patient seen and examined. Is admitted with acute on chronic diastolic CHF. He is almost 1 L negative since admission. Most recent ECHO with EF of 55-60% in 10/2012. Continue lasix and strive for negative fluid balance. Is WC bound so do not believe PT/OT consultations are necessary. Will replete K as needed. Has a chronic RLE ulcer that is being managed by Dr. Migdalia Dk. Has an appointment with her on Monday. Hopefully, will be out by then so he doesn't have to miss it. Will continue to follow. Hb is 7.6. Follow closely for need of transfusion.  Domingo Mend, MD [facility] Pager: [phone]

## 2013-01-11 NOTE — ED Notes (Signed)
Patient arrives via EMS from home with complaint of shortness of breath while lying down. Explains that he has been feeling this sensation for a number of weeks, and is supposed to be seeing his physician about it soon. Indicates that he was lying down this evening and upon repositioning in bed felt profound SOB which made him feel faint, and caused him to breath rapidly to catch his breath. Episode lasted a couple minute and resolved after patient propped himself up. Currently has no complaints aside from repeated episodes. NAD, VS WDL.

## 2013-01-12 LAB — BASIC METABOLIC PANEL
BUN: 23 mg/dL (ref 6–23)
CO2: 21 meq/L (ref 19–32)
CREATININE: 1.77 mg/dL — AB (ref 0.50–1.35)
Calcium: 7.3 mg/dL — ABNORMAL LOW (ref 8.4–10.5)
Chloride: 101 mEq/L (ref 96–112)
GFR calc Af Amer: 39 mL/min — ABNORMAL LOW (ref >90)
GFR calc non Af Amer: 34 mL/min — ABNORMAL LOW (ref >90)
Glucose, Bld: 126 mg/dL — ABNORMAL HIGH (ref 70–99)
Potassium: 4.2 mEq/L (ref 3.7–5.3)
Sodium: 138 mEq/L (ref 137–147)

## 2013-01-12 LAB — GLUCOSE, CAPILLARY
GLUCOSE-CAPILLARY: 141 mg/dL — AB (ref 70–99)
Glucose-Capillary: 127 mg/dL — ABNORMAL HIGH (ref 70–99)

## 2013-01-12 LAB — OCCULT BLOOD X 1 CARD TO LAB, STOOL: Fecal Occult Bld: NEGATIVE

## 2013-01-12 MED ORDER — DABIGATRAN ETEXILATE MESYLATE 75 MG PO CAPS: 75.0000 mg | ORAL_CAPSULE | Freq: Two times a day (BID) | ORAL | Status: DC

## 2013-01-12 MED ORDER — LINAGLIPTIN 5 MG PO TABS: 5.0000 mg | ORAL_TABLET | Freq: Every day | ORAL | Status: DC

## 2013-01-12 MED ORDER — METOPROLOL TARTRATE 25 MG PO TABS: 25.0000 mg | ORAL_TABLET | Freq: Two times a day (BID) | ORAL | Status: DC

## 2013-01-12 MED ORDER — FUROSEMIDE 40 MG PO TABS: 40.0000 mg | ORAL_TABLET | Freq: Every day | ORAL | Status: DC

## 2013-01-12 MED ORDER — LOSARTAN POTASSIUM 25 MG PO TABS: 25.0000 mg | ORAL_TABLET | Freq: Every day | ORAL | Status: DC

## 2013-01-12 MED ORDER — POTASSIUM CHLORIDE CRYS ER 20 MEQ PO TBCR: 20.0000 meq | EXTENDED_RELEASE_TABLET | Freq: Every day | ORAL | Status: DC

## 2013-01-12 NOTE — Consult Note (Addendum)
WOC wound consult note Reason for Consult: Consult requested for Vac to right foot.  Pt is followed as an outpatient by the wound care center and Dr Migdalia Dk of the plastics team; refer to previous progress notes from 12/3.  He uses a Vac machine to right foot full thickness wound prior to admission and it is due to be changed on Monday.  He is currently on his home  Freedom Vac machine at 173mm cont suction and dressing is intact with good seal.  He has machine with charger in place and is under the impression that he will be discharged soon.  If pt is still in the hospital on Monday, then [place] team can change the dressing at that time and put him on a hospital Vac machine if he will be admitted for several days. Julien Girt MSN, RN, [place], [place], [place]

## 2013-01-12 NOTE — Progress Notes (Signed)
   CARE MANAGEMENT NOTE 01/12/2013  Patient:  Craig Martin, Craig Martin   Account Number:  [id number]  Date Initiated:  01/12/2013  Documentation initiated by:  [facility]  Subjective/Objective Assessment:   adm: c/o SOB     Action/Plan:   discharge planning   Anticipated DC Date:  01/12/2013   Anticipated DC Plan:  [place]  CM consult      [facility]  HOME HEALTH   Choice offered to / List presented to:          [facility] arranged  HH-1 RN  [place]      [place].   Status of service:  Completed, signed off Medicare Important Message given?   (If response is "NO", the following Medicare IM given date fields will be blank) Date Medicare IM given:   Date Additional Medicare IM given:    Discharge Disposition:  [place]  Per UR Regulation:    If discussed at Long Length of Stay Meetings, dates discussed:    Comments:  01/12/13 14:25 CM met with pt in room to offer choice.  Pt chooses [facility] to render HHRN(disease mgmt-CHF)/aide.  Address and contact number was verified.  Referral faxed to [facility]. Pt has wheelchair and is NWB.  No other CM needs were communicated.  Mariane Masters, BSN, CM [phone].

## 2013-01-12 NOTE — Discharge Summary (Signed)
Physician Discharge Summary  [name] DOB: January 21, 1926 DOA: 01/11/2013  PCP: [name], MD  Admit date: 01/11/2013 Discharge date: 01/12/2013  Time spent: 45 minutes  Recommendations for Outpatient Follow-up:  -Will be discharged home today. -Advised to follow up with his PCP in 2 weeks and with Dr. Migdalia Dk as scheduled for tomorrow.   Discharge Diagnoses:  Principal Problem:   Acute diastolic CHF (congestive heart failure) Active Problems:   Leg ulcer   Orthopnea   Anemia   Discharge Condition: Stable and improved  Filed Weights   01/11/13 0435 01/11/13 0508 01/12/13 0642  Weight: 79.4 kg (175 lb 0.7 oz) 79.4 kg (175 lb 0.7 oz) 78.2 kg (172 lb 6.4 oz)    History of present illness:  Craig Martin is a 80 y.o. male who presents to the ED with c/o SOB. His symptoms have slowly and progressively worsened for the past couple of weeks. He goes on to explain that his SOB only occurs when lying down. He is able to breath normally when sitting up. He also reports that his BLE edema is new, and states that he recently ran out of medications. On further questioning and examination of his pill bottle, his lasix script was filled on 1/7, and before that he states he had been out of it for some time "about a week or two". Hospitalist admission was requested.   Hospital Course:   Acute on Chronic Diastolic CHF -Has diuresed 2 liters since admission. -2/2 non-compliance with his lasix. -ECHO 10/14 with an EF of 55-60%. -Continue BB.  Right Leg Wound -Currently has VAC in place and is being followed by Dr. Migdalia Dk with plastic surgery.  Anemia -Hb has remained in the 7 range. -Not at threshold for transfusion. -No GI bleeding.  Procedures:  None   Consultations:  None  Discharge Instructions  Discharge Orders   Future Appointments Provider Department Dept Phone   01/22/2013 11:10 AM Zenovia Jarred, MD [facility], [place] [phone]   Future Orders Complete By Expires   Diet - low sodium heart healthy  As directed    Discontinue IV  As directed    Increase activity slowly  As directed        Medication List    STOP taking these medications       Heart Rate Monitor Misc      TAKE these medications       dabigatran 75 MG Caps capsule  Commonly known as:  PRADAXA  Take 75 mg by mouth 2 (two) times daily.     DSS 100 MG Caps  Take 100 mg by mouth 2 (two) times daily.     furosemide 40 MG tablet  Commonly known as:  LASIX  Take 1 tablet (40 mg total) by mouth daily.     linagliptin 5 MG Tabs tablet  Commonly known as:  TRADJENTA  Take 5 mg by mouth daily.     losartan 25 MG tablet  Commonly known as:  COZAAR  Take 25 mg by mouth daily.     LUMIGAN 0.01 % Soln  Generic drug:  bimatoprost  Place 1 drop into both eyes at bedtime.     metoprolol tartrate 25 MG tablet  Commonly known as:  LOPRESSOR  Take 1 tablet (25 mg total) by mouth 2 (two) times daily.     oxyCODONE-acetaminophen 5-325 MG per tablet  Commonly known as:  PERCOCET/ROXICET  Take 1-2 tablets by mouth every 6 (six) hours as needed for  moderate pain.     potassium chloride SA 20 MEQ tablet  Commonly known as:  K-DUR,KLOR-CON  Take 1 tablet (20 mEq total) by mouth daily.     timolol 0.5 % ophthalmic solution  Commonly known as:  TIMOPTIC  Place 1 drop into both eyes daily.       No Known Allergies     Follow-up Information   Follow up with [name], MD. Schedule an appointment as soon as possible for a visit in 2 weeks.   Specialty:  Internal Medicine   Contact information:   [address] [place] [address] [phone]       Follow up with [facility], DO. (as scheduled for 01/13/13)    Specialty:  Plastic Surgery   Contact information:   [place] [address] [phone]        The results of significant diagnostics from this hospitalization (including imaging,  microbiology, ancillary and laboratory) are listed below for reference.    Significant Diagnostic Studies: Dg Chest Port 1 View  01/11/2013   CLINICAL DATA:  Dyspnea on exertion.  EXAM: PORTABLE CHEST - 1 VIEW  COMPARISON:  PA and lateral chest 01/17/2012.  FINDINGS: There is cardiomegaly without edema. Lungs are clear. No pneumothorax or pleural effusion.  IMPRESSION: Cardiomegaly without acute disease.   Electronically Signed   By: Inge Rise M.D.   On: 01/11/2013 02:09    Microbiology: No results found for this or any previous visit (from the past 240 hour(s)).   Labs: Basic Metabolic Panel:  Recent Labs Lab 01/11/13 0145 01/12/13 0417  NA 136* 138  K 3.3* 4.2  CL 102 101  CO2 19 21  GLUCOSE 159* 126*  BUN 19 23  CREATININE 1.48* 1.77*  CALCIUM 7.5* 7.3*   Liver Function Tests: No results found for this basename: AST, ALT, ALKPHOS, BILITOT, PROT, ALBUMIN,  in the last 168 hours No results found for this basename: LIPASE, AMYLASE,  in the last 168 hours No results found for this basename: AMMONIA,  in the last 168 hours CBC:  Recent Labs Lab 01/11/13 0145  WBC 5.9  NEUTROABS 3.6  HGB 7.6*  HCT 22.6*  MCV 88.3  PLT 208   Cardiac Enzymes:  Recent Labs Lab 01/11/13 0145  TROPONINI <0.30   BNP: BNP (last 3 results)  Recent Labs  01/11/13 0145  PROBNP 6537.0*   CBG:  Recent Labs Lab 01/11/13 1058 01/11/13 1614 01/11/13 2100 01/12/13 0548 01/12/13 1125  GLUCAP 147* 108* 131* 127* 141*       Signed:  HERNANDEZ ACOSTA,ESTELA  [facility] Pager: [phone] 01/12/2013, 1:05 PM

## 2013-01-12 NOTE — Progress Notes (Signed)
Patient alert and oriented x4 throughout shift.  Discharged to home with home health.  Patient's IV removed prior to discharge; IV site clean, dry, and intact.  Discharge instructions discussed with patient.  Patient voiced understanding of discharge instructions and education.  Medications and prescriptions discussed with patient prior to discharge.

## 2013-01-12 NOTE — Discharge Instructions (Signed)
Information on my medicine - Pradaxa (dabigatran)  This medication education was reviewed with me or my healthcare representative as part of my discharge preparation.  The pharmacist that spoke with me during my hospital stay was:  [facility]  Why was Pradaxa prescribed for you? Pradaxa was prescribed for you to reduce the risk of forming blood clots that cause a stroke if you have a medical condition called atrial fibrillation (a type of irregular heartbeat).  This can lead to blood clots forming and increase your risk of a stroke.  Pradaxa is a blood thinner medicine that lowers the chance of blood clots forming in your body.  What do you Need to know about PradAXa? Take your Pradaxa TWICE DAILY - one tablet in the morning and one tablet in the evening with or without food.  It would be best to take the doses about the same time each day.  The capsules should not be broken, chewed or opened - they must be swallowed whole.  Do not store Pradaxa in other medication containers - once the bottle is opened the Pradaxa should be used within FOUR months; throw away any capsules that havent been used within that time.  Take Pradaxa exactly as prescribed by your doctor.  DO NOT stop taking Pradaxa without talking to the doctor who prescribed the medication.  Stopping may increase your risk of developing a new clot or stroke.  Refill your prescription before you run out.  After discharge, you should have regular check-up appointments with your healthcare provider that is prescribing your Pradaxa.  In the future your dose may need to be changed if your kidney function or weight changes by a significant amount.  What do you do if you miss a dose? If you miss a dose, take it as soon as you remember on the same day.  If your next dose is less than 6 hours away, skip the missed dose.  Do not take two doses of PRADAXA at the same time.  Important Safety Information A possible side effect of  Pradaxa is bleeding. You should call your healthcare provider right away if you experience any of the following:   Bleeding from an injury or your nose that does not stop.   Unusual colored urine (red or dark brown) or unusual colored stools (red or black).   Unusual bruising for unknown reasons.   A serious fall or if you hit your head (even if there is no bleeding).  Some medicines may interact with Pradaxa and might increase your risk of bleeding or clotting while on Pradaxa. To help avoid this, consult your healthcare provider or pharmacist prior to using any new prescription or non-prescription medications, including herbals, vitamins, non-steroidal anti-inflammatory drugs (NSAIDs) and supplements.  This website has more information on Pradaxa (dabigatran): www.RuleEnforcement.cz.

## 2013-01-20 LAB — GLUCOSE, CAPILLARY: GLUCOSE-CAPILLARY: 126 mg/dL — AB (ref 70–99)

## 2013-01-21 NOTE — Progress Notes (Signed)
[facility]  NAME:  Craig Martin, Craig Martin                  ACCOUNT NO.:  MEDICAL RECORD NO.:  [id number]      DATE OF BIRTH:  [birth year]  PHYSICIAN:  Theodoro Kos, DO       VISIT DATE:  01/20/2013                                  OFFICE VISIT   The patient is an 80 year old male who is here for followup after excision of his melanoma on his right foot and placement of ACell and the VAC.  He is doing much better healing in as far as the depth and the look and smell of the wound, but now, he has got some breakdown on the dorsal aspect, as well as on his heel secondary to pressure and the boot, so we are going to make some changes, put a heel cuff on.  We will continue with the collagen and the VAC.  We will see him back in 1 week.     Theodoro Kos, DO     CS/MEDQ  D:  01/20/2013  T:  01/20/2013  Job:  [id number]

## 2013-01-22 ENCOUNTER — Ambulatory Visit (INDEPENDENT_AMBULATORY_CARE_PROVIDER_SITE_OTHER): Payer: Medicare Other | Admitting: General Surgery

## 2013-01-22 ENCOUNTER — Encounter (INDEPENDENT_AMBULATORY_CARE_PROVIDER_SITE_OTHER): Payer: Self-pay | Admitting: General Surgery

## 2013-01-22 VITALS — BP 126/74 | HR 72 | Resp 14

## 2013-01-22 DIAGNOSIS — C4371 Malignant melanoma of right lower limb, including hip: Secondary | ICD-10-CM

## 2013-01-22 DIAGNOSIS — C4370 Malignant melanoma of unspecified lower limb, including hip: Secondary | ICD-10-CM

## 2013-01-22 NOTE — Progress Notes (Signed)
Subjective:     Patient ID: Craig Martin, male   DOB: [birth year], 80 y.o.   MRN: [id number]  HPI Patient presents status post wide excision melanoma right foot with right inguinal lymph node biopsy. Dr. Migdalia Dk is continuing to follow him for wound care but his wife excision site. He currently has a VAC on it. Otherwise, he has no complaints in his right inguinal region. He is getting stronger. He is now returned home with home health nursing.  Review of Systems     Objective:   Physical Exam Right inguinal region is palpable adenopathy but incision is well-healed without signs of infection Right foot is in a boot with VAC in place. Toes warm and mobile. VAC was not removed.    Assessment:     Coming along status post wide excision melanoma right foot with right inguinal lymph node biopsy.    Plan:     Continue wound care with Dr. Migdalia Dk from plastic surgery. I will refer him to Dr. Benay Spice from oncology for further evaluation due to the stage of this tumor. I will see him back in 3 months.

## 2013-01-27 ENCOUNTER — Telehealth: Payer: Self-pay | Admitting: Internal Medicine

## 2013-01-27 NOTE — Telephone Encounter (Signed)
Called pt to scheduled np appt not able to leave message.

## 2013-01-28 ENCOUNTER — Telehealth: Payer: Self-pay | Admitting: Internal Medicine

## 2013-01-28 NOTE — Telephone Encounter (Signed)
CALLED PATIENT TO SCHEDULE NEW PATIENT APPOINTMENT. PATIENT DECLINE APPOINTMENT STATING HE DID NOT WHY HE WAS BEING REFERRED AND WANTED TO SPEAK WITH THE MD BEFORE SCHEDULING APPT. PATIENT ALSO STATED HE DID NOT HAVE THE [place]ONEY FOR ANYMORE DOCTOR VISITS AND BILLS WERE PILING UP. INFORMED PATIENT THAT WE DID HAVE [employer] HERE AT THE FACILITY FOR HIM TO SPEAK WITH ABOUT BILLS PATIENT STILL DECLINED APPT. CONTACT NUMBER WAS LEFT FOR PATIENT TO CALL BACK IF APPOINTMENT WANTED TO BE MADE.   CALLED REFERRING OFFICE TO INFORM.

## 2013-01-29 ENCOUNTER — Ambulatory Visit: Payer: Medicare Other

## 2013-01-29 ENCOUNTER — Other Ambulatory Visit: Payer: Medicare Other

## 2013-01-29 NOTE — Progress Notes (Signed)
[facility]  NAME:  ROBERTA, KELLY                  ACCOUNT NO.:  MEDICAL RECORD NO.:  [id number]      DATE OF BIRTH:  12-Sep-1926  PHYSICIAN:  Theodoro Kos, DO       VISIT DATE:  01/27/2013                                  OFFICE VISIT   The patient is an 80 year old male who is here for followup on his right lower extremity foot chronic ulcer secondary to melanoma.  Overall, the area is filling in very nicely.  He had a little bit of necrotic tissue. This was debrided as well.  We are going to put some Santyl on the dorsal wound, continue with the VAC, but add collagen to the foot, elevation, multivitamin, vitamin C, zinc, protein, and see him back in a week.     Theodoro Kos, DO     CS/MEDQ  D:  01/27/2013  T:  01/28/2013  Job:  [id number]

## 2013-02-03 ENCOUNTER — Encounter (HOSPITAL_BASED_OUTPATIENT_CLINIC_OR_DEPARTMENT_OTHER): Payer: Medicare Other | Attending: Plastic Surgery

## 2013-02-03 DIAGNOSIS — C4370 Malignant melanoma of unspecified lower limb, including hip: Secondary | ICD-10-CM | POA: Insufficient documentation

## 2013-02-03 DIAGNOSIS — L97509 Non-pressure chronic ulcer of other part of unspecified foot with unspecified severity: Secondary | ICD-10-CM | POA: Insufficient documentation

## 2013-02-04 LAB — GLUCOSE, CAPILLARY: Glucose-Capillary: 143 mg/dL — ABNORMAL HIGH (ref 70–99)

## 2013-02-10 LAB — GLUCOSE, CAPILLARY: Glucose-Capillary: 123 mg/dL — ABNORMAL HIGH (ref 70–99)

## 2013-02-17 LAB — GLUCOSE, CAPILLARY: Glucose-Capillary: 135 mg/dL — ABNORMAL HIGH (ref 70–99)

## 2013-02-18 NOTE — Progress Notes (Signed)
[facility]  NAME:  Craig Martin, Craig Martin             ACCOUNT NO.:  [id number]  MEDICAL RECORD NO.:  [id number]      DATE OF BIRTH:  1926/07/18  PHYSICIAN:  Theodoro Kos, DO       VISIT DATE:  02/17/2013                                  OFFICE VISIT   The patient is an 80 year old gentleman who is here for followup on his right foot chronic diabetic ulcer secondary to excision of melanoma. Overall it is filling in very well and healing, just slow for the epithelialization.  We will continue with the VAC, and add some silver collagen, apply for some Oasis on the skin, and then we will see him back in a week.     Theodoro Kos, DO     CS/MEDQ  D:  02/17/2013  T:  02/17/2013  Job:  [id number]

## 2013-02-24 LAB — GLUCOSE, CAPILLARY: Glucose-Capillary: 108 mg/dL — ABNORMAL HIGH (ref 70–99)

## 2013-02-25 NOTE — Progress Notes (Signed)
[facility]  NAME:  Craig Martin, HARTIS NO.:  [id number]  MEDICAL RECORD NO.:  [id number]      DATE OF BIRTH:  11-18-1926  PHYSICIAN:  Theodoro Kos, DO       VISIT DATE:  02/24/2013                                  OFFICE VISIT   The patient is an 80 year old gentleman, who is here for followup on his right foot ulcer.  He underwent excision of a melanoma and overall is doing well.  The area is slow to heal, but there is progress.  There has been no change in his medications or social history.  On exam he is alert, oriented, cooperative, not in any acute distress. He is pleasant.  Pupils are equal.  Extraocular muscles are intact.  No cervical lymphadenopathy.  His incision is healing well.  He has a heel ulcer and a dorsal ulcer. The heel ulcer was debrided and noted in the note.  We will continue with the VAC on the foot and Santyl on the other portions and with collagen and we will see him back in 1 week.     Theodoro Kos, DO     CS/MEDQ  D:  02/24/2013  T:  02/25/2013  Job:  [id number]

## 2013-03-03 ENCOUNTER — Encounter (HOSPITAL_BASED_OUTPATIENT_CLINIC_OR_DEPARTMENT_OTHER): Payer: Medicare Other | Attending: Plastic Surgery

## 2013-03-03 DIAGNOSIS — Z85820 Personal history of malignant melanoma of skin: Secondary | ICD-10-CM | POA: Insufficient documentation

## 2013-03-03 DIAGNOSIS — L97409 Non-pressure chronic ulcer of unspecified heel and midfoot with unspecified severity: Secondary | ICD-10-CM | POA: Insufficient documentation

## 2013-03-03 DIAGNOSIS — L97509 Non-pressure chronic ulcer of other part of unspecified foot with unspecified severity: Secondary | ICD-10-CM | POA: Insufficient documentation

## 2013-03-03 LAB — GLUCOSE, CAPILLARY: Glucose-Capillary: 177 mg/dL — ABNORMAL HIGH (ref 70–99)

## 2013-03-04 NOTE — Progress Notes (Signed)
[facility]  NAME:  Craig Martin, Craig Martin NO.:  [id number]  MEDICAL RECORD NO.:  [id number]      DATE OF BIRTH:  [birth year]  PHYSICIAN:  Theodoro Kos, DO       VISIT DATE:  03/03/2013                                  OFFICE VISIT   The patient is an 80 year old gentleman, who is here for followup on his right foot nail chronic ulcer secondary to excision of melanoma.  He is actually filling it in and it is getting smaller.  Unfortunately, he developed a heel ulcer and an ulcer on the top of his foot on the dorsal aspect as well.  The heel was debrided.  We will continue with the VAC. The debridement notes are noted in the chart.  We will put collagen under the [facility] and we will also apply for an Apligraf.  He was also educated on the importance of elevation, protein, multivitamin, vitamin C, and he will follow up in a week.     Theodoro Kos, DO     CS/MEDQ  D:  03/03/2013  T:  03/04/2013  Job:  [id number]

## 2013-03-17 NOTE — Progress Notes (Signed)
[facility]  NAME:  QUENTIN, SHOREY             ACCOUNT NO.:  [id number]  MEDICAL RECORD NO.:  [id number]      DATE OF BIRTH:  August 31, 1926  PHYSICIAN:  Theodoro Kos, DO       VISIT DATE:  03/17/2013                                  OFFICE VISIT   HISTORY OF PRESENT ILLNESS:  The patient is an 80 year old who is here for followup on his right lower extremity foot ulcer chronic secondary to excision of a melanoma.  He is doing much better.  All areas are improving, although slowly, but it is filling in and he is getting some improvement.  There has been no change in his medications.  SOCIAL HISTORY:  He is still at home and takes care of his brother. Home Health is coming and changing the VAC 1-2 times a week.  PHYSICAL EXAMINATION:  GENERAL:  He is alert, oriented, and cooperative. He is not in distress and he is handling the situation quite well. ABDOMEN:  Soft. LUNGS;  Breathing is unlabored. CARDIAC:  Heart rate is regular.  PLAN:  If there does not appear to be any infection and the VAC is still doing well, we will continue with the VAC. We will put  collagen underneath.  He will meet me in my office next week for ACell placement and then we will see him back here in 2 weeks     Theodoro Kos, DO     CS/MEDQ  D:  03/17/2013  T:  03/17/2013  Job:  [id number]

## 2013-03-31 NOTE — Progress Notes (Signed)
[facility]  NAME:  ARYAV, WIMBERLY NO.:  [id number]  MEDICAL RECORD NO.:  [id number]      DATE OF BIRTH:  1926-07-11  PHYSICIAN:  Theodoro Kos, DO       VISIT DATE:  03/31/2013                                  OFFICE VISIT   The patient is an 80 year old gentleman who is here for a followup after undergoing excision of a melanoma on his right foot.  He is pleasant like usual.  He has an ACell placed last week and is using the VAC at home.  There is no change in his medications or social history. He is still taking care of his brother.  He is alert, oriented, and cooperative, not in any acute distress.  He is pleasant like usual.  The wound is improving both in depth and size.  It is low but it is getting better.  There is no sign of infection, so we will put some collagen on there today with the VAC.  Continue with elevation, multivitamin, vitamin C, and protein drinks which he has not been taking this week, and we will see him back in 1 week.     Theodoro Kos, DO     CS/MEDQ  D:  03/31/2013  T:  03/31/2013  Job:  [id number]

## 2013-04-14 ENCOUNTER — Encounter (HOSPITAL_BASED_OUTPATIENT_CLINIC_OR_DEPARTMENT_OTHER): Payer: Medicare Other | Attending: Plastic Surgery

## 2013-04-14 DIAGNOSIS — Z85820 Personal history of malignant melanoma of skin: Secondary | ICD-10-CM | POA: Insufficient documentation

## 2013-04-14 DIAGNOSIS — L97509 Non-pressure chronic ulcer of other part of unspecified foot with unspecified severity: Secondary | ICD-10-CM | POA: Insufficient documentation

## 2013-04-15 NOTE — Progress Notes (Signed)
[facility]  NAME:  Craig Martin, Craig Martin NO.:  [id number]  MEDICAL RECORD NO.:  [id number]      DATE OF BIRTH:  Feb 14, 1926  PHYSICIAN:  Theodoro Kos, DO       VISIT DATE:  04/14/2013                                  OFFICE VISIT   The patient is an 80 year old male, here with for a followup on his right foot ulcer secondary to excision of a melanoma.  Overall, he is filling in and doing much better.  He is getting the VAC change 2 to 3 times a week.  We have been putting some collagen underneath the VAC, and he is doing very well with that.  He is alert, oriented, and cooperative.  He seems to be aware of his situation.  There is no sign of infection.  All the wounds are improving, so today I put Endoform on the area and the VAC.  We will have him come to my office.  We will have the VAC changed on Friday of this week with Home Health.  He will see me on Tuesday of next week and then we will see him the following Monday and he is aware of this.     Theodoro Kos, DO     CS/MEDQ  D:  04/14/2013  T:  04/15/2013  Job:  [id number]

## 2013-04-29 LAB — GLUCOSE, CAPILLARY: GLUCOSE-CAPILLARY: 180 mg/dL — AB (ref 70–99)

## 2013-05-02 ENCOUNTER — Inpatient Hospital Stay (HOSPITAL_COMMUNITY)
Admission: EM | Admit: 2013-05-02 | Discharge: 2013-05-06 | DRG: 377 | Disposition: A | Discharge status: Skilled Nursing Facility | Payer: Medicare Other | Attending: Family Medicine | Admitting: Family Medicine

## 2013-05-02 ENCOUNTER — Encounter (HOSPITAL_COMMUNITY): Payer: Self-pay | Admitting: Nurse Practitioner

## 2013-05-02 ENCOUNTER — Emergency Department (HOSPITAL_COMMUNITY): Payer: Medicare Other

## 2013-05-02 DIAGNOSIS — R791 Abnormal coagulation profile: Secondary | ICD-10-CM | POA: Diagnosis present

## 2013-05-02 DIAGNOSIS — I4460 Unspecified fascicular block: Secondary | ICD-10-CM | POA: Diagnosis present

## 2013-05-02 DIAGNOSIS — I2789 Other specified pulmonary heart diseases: Secondary | ICD-10-CM | POA: Diagnosis present

## 2013-05-02 DIAGNOSIS — I2489 Other forms of acute ischemic heart disease: Secondary | ICD-10-CM | POA: Diagnosis present

## 2013-05-02 DIAGNOSIS — D500 Iron deficiency anemia secondary to blood loss (chronic): Secondary | ICD-10-CM | POA: Diagnosis present

## 2013-05-02 DIAGNOSIS — I482 Chronic atrial fibrillation, unspecified: Secondary | ICD-10-CM

## 2013-05-02 DIAGNOSIS — K254 Chronic or unspecified gastric ulcer with hemorrhage: Secondary | ICD-10-CM | POA: Diagnosis present

## 2013-05-02 DIAGNOSIS — K922 Gastrointestinal hemorrhage, unspecified: Secondary | ICD-10-CM | POA: Diagnosis present

## 2013-05-02 DIAGNOSIS — Z6828 Body mass index (BMI) 28.0-28.9, adult: Secondary | ICD-10-CM

## 2013-05-02 DIAGNOSIS — Z833 Family history of diabetes mellitus: Secondary | ICD-10-CM

## 2013-05-02 DIAGNOSIS — I5031 Acute diastolic (congestive) heart failure: Secondary | ICD-10-CM

## 2013-05-02 DIAGNOSIS — I70229 Atherosclerosis of native arteries of extremities with rest pain, unspecified extremity: Secondary | ICD-10-CM

## 2013-05-02 DIAGNOSIS — T45515A Adverse effect of anticoagulants, initial encounter: Secondary | ICD-10-CM | POA: Diagnosis present

## 2013-05-02 DIAGNOSIS — I2510 Atherosclerotic heart disease of native coronary artery without angina pectoris: Secondary | ICD-10-CM | POA: Diagnosis present

## 2013-05-02 DIAGNOSIS — E669 Obesity, unspecified: Secondary | ICD-10-CM | POA: Diagnosis present

## 2013-05-02 DIAGNOSIS — I248 Other forms of acute ischemic heart disease: Secondary | ICD-10-CM | POA: Diagnosis present

## 2013-05-02 DIAGNOSIS — D689 Coagulation defect, unspecified: Secondary | ICD-10-CM | POA: Diagnosis present

## 2013-05-02 DIAGNOSIS — I509 Heart failure, unspecified: Secondary | ICD-10-CM | POA: Diagnosis present

## 2013-05-02 DIAGNOSIS — R590 Localized enlarged lymph nodes: Secondary | ICD-10-CM

## 2013-05-02 DIAGNOSIS — I129 Hypertensive chronic kidney disease with stage 1 through stage 4 chronic kidney disease, or unspecified chronic kidney disease: Secondary | ICD-10-CM | POA: Diagnosis present

## 2013-05-02 DIAGNOSIS — Z7901 Long term (current) use of anticoagulants: Secondary | ICD-10-CM

## 2013-05-02 DIAGNOSIS — R7989 Other specified abnormal findings of blood chemistry: Secondary | ICD-10-CM | POA: Diagnosis present

## 2013-05-02 DIAGNOSIS — Z8673 Personal history of transient ischemic attack (TIA), and cerebral infarction without residual deficits: Secondary | ICD-10-CM

## 2013-05-02 DIAGNOSIS — I5021 Acute systolic (congestive) heart failure: Secondary | ICD-10-CM

## 2013-05-02 DIAGNOSIS — I4891 Unspecified atrial fibrillation: Secondary | ICD-10-CM | POA: Diagnosis present

## 2013-05-02 DIAGNOSIS — D62 Acute posthemorrhagic anemia: Secondary | ICD-10-CM | POA: Diagnosis not present

## 2013-05-02 DIAGNOSIS — N400 Enlarged prostate without lower urinary tract symptoms: Secondary | ICD-10-CM | POA: Diagnosis present

## 2013-05-02 DIAGNOSIS — C4370 Malignant melanoma of unspecified lower limb, including hip: Secondary | ICD-10-CM

## 2013-05-02 DIAGNOSIS — R778 Other specified abnormalities of plasma proteins: Secondary | ICD-10-CM | POA: Diagnosis present

## 2013-05-02 DIAGNOSIS — H409 Unspecified glaucoma: Secondary | ICD-10-CM | POA: Diagnosis present

## 2013-05-02 DIAGNOSIS — Z7982 Long term (current) use of aspirin: Secondary | ICD-10-CM

## 2013-05-02 DIAGNOSIS — C4371 Malignant melanoma of right lower limb, including hip: Secondary | ICD-10-CM | POA: Diagnosis present

## 2013-05-02 DIAGNOSIS — R0601 Orthopnea: Secondary | ICD-10-CM

## 2013-05-02 DIAGNOSIS — R0902 Hypoxemia: Secondary | ICD-10-CM | POA: Diagnosis present

## 2013-05-02 DIAGNOSIS — E41 Nutritional marasmus: Secondary | ICD-10-CM | POA: Diagnosis present

## 2013-05-02 DIAGNOSIS — IMO0002 Reserved for concepts with insufficient information to code with codable children: Secondary | ICD-10-CM

## 2013-05-02 DIAGNOSIS — L97909 Non-pressure chronic ulcer of unspecified part of unspecified lower leg with unspecified severity: Secondary | ICD-10-CM

## 2013-05-02 DIAGNOSIS — I472 Ventricular tachycardia: Secondary | ICD-10-CM

## 2013-05-02 DIAGNOSIS — K31811 Angiodysplasia of stomach and duodenum with bleeding: Principal | ICD-10-CM | POA: Diagnosis present

## 2013-05-02 DIAGNOSIS — Z9861 Coronary angioplasty status: Secondary | ICD-10-CM

## 2013-05-02 DIAGNOSIS — I214 Non-ST elevation (NSTEMI) myocardial infarction: Secondary | ICD-10-CM | POA: Diagnosis present

## 2013-05-02 DIAGNOSIS — I5032 Chronic diastolic (congestive) heart failure: Secondary | ICD-10-CM | POA: Diagnosis present

## 2013-05-02 DIAGNOSIS — D649 Anemia, unspecified: Secondary | ICD-10-CM

## 2013-05-02 DIAGNOSIS — R195 Other fecal abnormalities: Secondary | ICD-10-CM

## 2013-05-02 DIAGNOSIS — K31819 Angiodysplasia of stomach and duodenum without bleeding: Secondary | ICD-10-CM

## 2013-05-02 DIAGNOSIS — E785 Hyperlipidemia, unspecified: Secondary | ICD-10-CM | POA: Diagnosis present

## 2013-05-02 DIAGNOSIS — I428 Other cardiomyopathies: Secondary | ICD-10-CM | POA: Diagnosis present

## 2013-05-02 DIAGNOSIS — Z6825 Body mass index (BMI) 25.0-25.9, adult: Secondary | ICD-10-CM

## 2013-05-02 DIAGNOSIS — N189 Chronic kidney disease, unspecified: Secondary | ICD-10-CM | POA: Diagnosis present

## 2013-05-02 DIAGNOSIS — I5033 Acute on chronic diastolic (congestive) heart failure: Secondary | ICD-10-CM | POA: Diagnosis present

## 2013-05-02 DIAGNOSIS — E119 Type 2 diabetes mellitus without complications: Secondary | ICD-10-CM | POA: Diagnosis present

## 2013-05-02 DIAGNOSIS — K259 Gastric ulcer, unspecified as acute or chronic, without hemorrhage or perforation: Secondary | ICD-10-CM

## 2013-05-02 DIAGNOSIS — C439 Malignant melanoma of skin, unspecified: Secondary | ICD-10-CM

## 2013-05-02 DIAGNOSIS — R578 Other shock: Secondary | ICD-10-CM | POA: Diagnosis present

## 2013-05-02 DIAGNOSIS — I10 Essential (primary) hypertension: Secondary | ICD-10-CM | POA: Diagnosis present

## 2013-05-02 DIAGNOSIS — R0602 Shortness of breath: Secondary | ICD-10-CM | POA: Diagnosis present

## 2013-05-02 DIAGNOSIS — I4729 Other ventricular tachycardia: Secondary | ICD-10-CM

## 2013-05-02 DIAGNOSIS — I998 Other disorder of circulatory system: Secondary | ICD-10-CM

## 2013-05-02 DIAGNOSIS — J189 Pneumonia, unspecified organism: Secondary | ICD-10-CM | POA: Diagnosis present

## 2013-05-02 DIAGNOSIS — Z87891 Personal history of nicotine dependence: Secondary | ICD-10-CM

## 2013-05-02 LAB — URINE MICROSCOPIC-ADD ON

## 2013-05-02 LAB — URINALYSIS, ROUTINE W REFLEX MICROSCOPIC
BILIRUBIN URINE: NEGATIVE
GLUCOSE, UA: NEGATIVE mg/dL
Hgb urine dipstick: NEGATIVE
KETONES UR: NEGATIVE mg/dL
NITRITE: NEGATIVE
Protein, ur: NEGATIVE mg/dL
Specific Gravity, Urine: 1.019 (ref 1.005–1.030)
Urobilinogen, UA: 1 mg/dL (ref 0.0–1.0)
pH: 5 (ref 5.0–8.0)

## 2013-05-02 LAB — COMPREHENSIVE METABOLIC PANEL
ALT: 17 U/L (ref 0–53)
AST: 19 U/L (ref 0–37)
Albumin: 2.3 g/dL — ABNORMAL LOW (ref 3.5–5.2)
Alkaline Phosphatase: 56 U/L (ref 39–117)
BUN: 36 mg/dL — AB (ref 6–23)
CO2: 13 mEq/L — ABNORMAL LOW (ref 19–32)
CREATININE: 1.77 mg/dL — AB (ref 0.50–1.35)
Calcium: 8.2 mg/dL — ABNORMAL LOW (ref 8.4–10.5)
Chloride: 103 mEq/L (ref 96–112)
GFR calc non Af Amer: 34 mL/min — ABNORMAL LOW (ref >90)
GFR, EST AFRICAN AMERICAN: 39 mL/min — AB (ref >90)
Glucose, Bld: 121 mg/dL — ABNORMAL HIGH (ref 70–99)
POTASSIUM: 4.4 meq/L (ref 3.7–5.3)
Sodium: 140 mEq/L (ref 137–147)
TOTAL PROTEIN: 8.5 g/dL — AB (ref 6.0–8.3)
Total Bilirubin: 1.5 mg/dL — ABNORMAL HIGH (ref 0.3–1.2)

## 2013-05-02 LAB — TROPONIN I: Troponin I: 0.34 ng/mL (ref <0.30)

## 2013-05-02 LAB — CBC WITH DIFFERENTIAL/PLATELET
Basophils Absolute: 0 10*3/uL (ref 0.0–0.1)
Basophils Relative: 0 % (ref 0–1)
EOS ABS: 0 10*3/uL (ref 0.0–0.7)
Eosinophils Relative: 0 % (ref 0–5)
HEMATOCRIT: 20.4 % — AB (ref 39.0–52.0)
HEMOGLOBIN: 6.3 g/dL — AB (ref 13.0–17.0)
LYMPHS ABS: 1.1 10*3/uL (ref 0.7–4.0)
Lymphocytes Relative: 15 % (ref 12–46)
MCH: 26.8 pg (ref 26.0–34.0)
MCHC: 30.9 g/dL (ref 30.0–36.0)
MCV: 86.8 fL (ref 78.0–100.0)
MONO ABS: 0.4 10*3/uL (ref 0.1–1.0)
MONOS PCT: 6 % (ref 3–12)
Neutro Abs: 5.7 10*3/uL (ref 1.7–7.7)
Neutrophils Relative %: 79 % — ABNORMAL HIGH (ref 43–77)
Platelets: 291 10*3/uL (ref 150–400)
RBC: 2.35 MIL/uL — AB (ref 4.22–5.81)
RDW: 19.5 % — ABNORMAL HIGH (ref 11.5–15.5)
WBC: 7.3 10*3/uL (ref 4.0–10.5)

## 2013-05-02 LAB — I-STAT TROPONIN, ED: TROPONIN I, POC: 0.18 ng/mL — AB (ref 0.00–0.08)

## 2013-05-02 LAB — PREPARE RBC (CROSSMATCH)

## 2013-05-02 LAB — RETICULOCYTES
RBC.: 2.34 MIL/uL — AB (ref 4.22–5.81)
Retic Count, Absolute: 79.6 10*3/uL (ref 19.0–186.0)
Retic Ct Pct: 3.4 % — ABNORMAL HIGH (ref 0.4–3.1)

## 2013-05-02 LAB — PROTIME-INR
INR: 1.89 — AB (ref 0.00–1.49)
PROTHROMBIN TIME: 21.1 s — AB (ref 11.6–15.2)

## 2013-05-02 LAB — HEMOGLOBIN AND HEMATOCRIT, BLOOD
HCT: 19.1 % — ABNORMAL LOW (ref 39.0–52.0)
Hemoglobin: 6.2 g/dL — CL (ref 13.0–17.0)

## 2013-05-02 LAB — ABO/RH: ABO/RH(D): O POS

## 2013-05-02 LAB — APTT: aPTT: 56 seconds — ABNORMAL HIGH (ref 24–37)

## 2013-05-02 LAB — PRO B NATRIURETIC PEPTIDE: PRO B NATRI PEPTIDE: 5942 pg/mL — AB (ref 0–450)

## 2013-05-02 MED ORDER — PANTOPRAZOLE SODIUM 40 MG IV SOLR
40.0000 mg | Freq: Two times a day (BID) | INTRAVENOUS | Status: DC
  Administered 2013-05-02 – 2013-05-04 (×5): 40 mg via INTRAVENOUS
  Filled 2013-05-02 (×7): qty 40

## 2013-05-02 MED ORDER — ACETAMINOPHEN 325 MG PO TABS
650.0000 mg | ORAL_TABLET | Freq: Four times a day (QID) | ORAL | Status: DC | PRN
  Administered 2013-05-03 – 2013-05-05 (×2): 650 mg via ORAL
  Filled 2013-05-02 (×2): qty 2

## 2013-05-02 MED ORDER — SODIUM CHLORIDE 0.9 % IV SOLN
INTRAVENOUS | Status: AC
  Administered 2013-05-02: 20:00:00 via INTRAVENOUS

## 2013-05-02 MED ORDER — SODIUM CHLORIDE 0.9 % IJ SOLN
3.0000 mL | Freq: Two times a day (BID) | INTRAMUSCULAR | Status: DC
  Administered 2013-05-03 – 2013-05-06 (×7): 3 mL via INTRAVENOUS

## 2013-05-02 MED ORDER — LATANOPROST 0.005 % OP SOLN
1.0000 [drp] | Freq: Every day | OPHTHALMIC | Status: DC
  Administered 2013-05-02 – 2013-05-05 (×4): 1 [drp] via OPHTHALMIC
  Filled 2013-05-02: qty 2.5

## 2013-05-02 MED ORDER — ONDANSETRON HCL 4 MG/2ML IJ SOLN: 4.0000 mg | Freq: Four times a day (QID) | INTRAMUSCULAR | Status: DC | PRN

## 2013-05-02 MED ORDER — ACETAMINOPHEN 325 MG PO TABS
650.0000 mg | ORAL_TABLET | Freq: Once | ORAL | Status: AC
  Administered 2013-05-03: 650 mg via ORAL
  Filled 2013-05-02: qty 2

## 2013-05-02 MED ORDER — FUROSEMIDE 10 MG/ML IJ SOLN
20.0000 mg | Freq: Once | INTRAMUSCULAR | Status: AC
  Administered 2013-05-03: 20 mg via INTRAVENOUS

## 2013-05-02 MED ORDER — ALUM & MAG HYDROXIDE-SIMETH 200-200-20 MG/5ML PO SUSP: 30.0000 mL | Freq: Four times a day (QID) | ORAL | Status: DC | PRN

## 2013-05-02 MED ORDER — ACETAMINOPHEN 650 MG RE SUPP: 650.0000 mg | Freq: Four times a day (QID) | RECTAL | Status: DC | PRN

## 2013-05-02 MED ORDER — OXYCODONE HCL 5 MG PO TABS: 5.0000 mg | ORAL_TABLET | ORAL | Status: DC | PRN

## 2013-05-02 MED ORDER — HYDROMORPHONE HCL PF 1 MG/ML IJ SOLN: 0.5000 mg | INTRAMUSCULAR | Status: DC | PRN

## 2013-05-02 MED ORDER — DIPHENHYDRAMINE HCL 25 MG PO CAPS
25.0000 mg | ORAL_CAPSULE | Freq: Once | ORAL | Status: AC
  Administered 2013-05-03: 25 mg via ORAL
  Filled 2013-05-02: qty 1

## 2013-05-02 MED ORDER — ONDANSETRON HCL 4 MG PO TABS: 4.0000 mg | ORAL_TABLET | Freq: Four times a day (QID) | ORAL | Status: DC | PRN

## 2013-05-02 MED ORDER — SODIUM CHLORIDE 0.9 % IV SOLN
INTRAVENOUS | Status: DC
  Administered 2013-05-02: 21:00:00 via INTRAVENOUS

## 2013-05-02 NOTE — ED Provider Notes (Signed)
Medical screening examination/treatment/procedure(s) were performed by non-physician practitioner and as supervising physician I was immediately available for consultation/collaboration.   EKG Interpretation   Date/Time:  Friday May 02 2013 16:40:23 EDT Ventricular Rate:  110 PR Interval:    QRS Duration: 89 QT Interval:  374 QTC Calculation: 506 R Axis:   -59 Text Interpretation:  Atrial fibrillation Ventricular premature complex  Left anterior fascicular block Probable anterior infarct, age  indeterminate No significant change since 01/11/13 Confirmed by Beau Fanny  MD,  Vyolet Sakuma ([id number]) on 05/02/2013 5:05:49 PM       Veryl Speak, MD 05/02/13 2321

## 2013-05-02 NOTE — ED Provider Notes (Signed)
CSN: [id number]     Arrival date & time 05/02/13  1638 History   First MD Initiated Contact with Patient 05/02/13 1641     Chief Complaint  Patient presents with  . Shortness of Breath  . Atrial Fibrillation     (Consider location/radiation/quality/duration/timing/severity/associated sxs/prior Treatment) Patient is a 80 y.o. male presenting with shortness of breath and atrial fibrillation. The history is provided by the patient. No language interpreter was used.  Shortness of Breath Severity:  Moderate Onset quality:  Gradual Duration:  1 week Progression:  Worsening Associated symptoms: no abdominal pain, no chest pain, no cough, no fever, no rash, no sore throat, no vomiting and no wheezing   Associated symptoms comment:  He presents to the ED with complaint of SOB without chest pain, cough, fever, palpitations. He states he has been undergoing treatment for a lower extremity ulceration by Dr. Migdalia Dk and that treatment has been doing well. He reports a history of CAD with stenting, hypertension. Atrial Fibrillation Associated symptoms include fatigue and weakness. Pertinent negatives include no abdominal pain, chest pain, congestion, coughing, fever, myalgias, nausea, rash, sore throat or vomiting.    Past Medical History  Diagnosis Date  . Glaucoma   . Bradycardia   . Diabetes mellitus   . Hyperlipidemia   . Hypertension   . Shortness of breath     WITH EXERTION  . Stroke     "I had a stroke many yrs ago" -no residual problems  . BPH (benign prostatic hyperplasia)   . Difficulty voiding   . Dysrhythmia     AFIB  - TAKES PRADAXA FOLLOWED BY DR. Gwenlyn Found  . Critical lower limb ischemia   . Melanoma   . Arthritis   . Coronary artery disease    Past Surgical History  Procedure Laterality Date  . Stented coronary artery  2004  . Transurethral resection of prostate  01/24/2012    Procedure: TRANSURETHRAL RESECTION OF THE PROSTATE WITH GYRUS INSTRUMENTS;  Surgeon: Alexis Frock, MD;  Location: [facility];  Service: Urology;  Laterality: N/A;  . Cardiac catheterization  09/19/2002    moderate LAD and RCA and normal LV function  . Nm myocar perf wall motion  05/24/2011    showed inferior scar w/o ischemia  . Doppler echocardiography  05/24/2011    EF >55%  . Doppler echocardiography  09/28/2009    EF >55% lvh w/ left atrial enlargement,diastolic dysfunctio,elevated lv filling pressure  . Nm myocar perf wall motion  09/09/2009    mild ischemia mid anterior,apical anterior and basal inferolateral  . Nm myocar perf wall motion  12/04/2002  . Nm myocar perf wall motion  05/21/2002  . Cardiac catheterization  07/03/2002  . Cataract extraction    . Melanoma excision Right 10/21/2012    Procedure: MELANOMA EXCISION;  Surgeon: Zenovia Jarred, MD;  Location: [place];  Service: General;  Laterality: Right;  . Inguinal lymph node biopsy Right 10/21/2012    Procedure: INGUINAL LYMPH NODE BIOPSY;  Surgeon: Zenovia Jarred, MD;  Location: [place];  Service: General;  Laterality: Right;  . Application of a-cell of extremity Right 10/21/2012    Procedure: APPLICATION OF A-CELL OF EXTREMITY;  Surgeon: Theodoro Kos, DO;  Location: [place];  Service: Plastics;  Laterality: Right;  . Incision and drainage of wound Right 12/04/2012    Procedure: IRRIGATION AND DEBRIDEMENT RIGHT FOOT WOUND WITH PLACEMENT OF ACELL/VAC;  Surgeon: Theodoro Kos, DO;  Location: [place];  Service: Plastics;  Laterality: Right;   Family History  Problem Relation Age of Onset  . Diabetes Mother   . Diabetes Brother    History  Substance Use Topics  . Smoking status: Former Smoker -- 1.50 packs/day    Types: Cigarettes    Quit date: 01/16/1986  . Smokeless tobacco: Former Systems developer  . Alcohol Use: No    Review of Systems  Constitutional: Positive for fatigue. Negative for fever and appetite change.  HENT: Negative for congestion and sore throat.   Respiratory: Positive for shortness of  breath. Negative for cough and wheezing.   Cardiovascular: Negative for chest pain and palpitations.  Gastrointestinal: Negative for nausea, vomiting and abdominal pain.  Musculoskeletal: Negative for myalgias.  Skin: Negative for rash.  Neurological: Positive for weakness. Negative for light-headedness.  All other systems reviewed and are negative.     Allergies  Review of patient's allergies indicates no known allergies.  Home Medications   Prior to Admission medications   Medication Sig Start Date End Date Taking? Authorizing Provider  acetaminophen (TYLENOL) 500 MG tablet Take 1,000 mg by mouth every 6 (six) hours as needed.   Yes Historical Provider, MD  aspirin 81 MG tablet Take 81 mg by mouth daily.   Yes Historical Provider, MD  bimatoprost (LUMIGAN) 0.01 % SOLN Place 1 drop into both eyes at bedtime.   Yes Historical Provider, MD  dabigatran (PRADAXA) 75 MG CAPS capsule Take 75 mg by mouth daily. 01/12/13  Yes Estela Leonie Green, MD  docusate sodium 100 MG CAPS Take 100 mg by mouth 2 (two) times daily. 12/05/12  Yes Shawn Rayburn, PA-C  furosemide (LASIX) 20 MG tablet Take 20 mg by mouth daily.   Yes Historical Provider, MD  linagliptin (TRADJENTA) 5 MG TABS tablet Take 1 tablet (5 mg total) by mouth daily. 01/12/13  Yes Estela Leonie Green, MD  losartan (COZAAR) 25 MG tablet Take 1 tablet (25 mg total) by mouth daily. 01/12/13  Yes Estela Leonie Green, MD  traMADol (ULTRAM) 50 MG tablet Take 50 mg by mouth every 6 (six) hours as needed for moderate pain.   Yes Historical Provider, MD   BP 131/76  Pulse 33  Temp(Src) 97.6 F (36.4 C) (Oral)  Resp 28  SpO2 89% Physical Exam  Constitutional: He is oriented to person, place, and time. He appears well-developed and well-nourished. No distress.  HENT:  Head: Normocephalic.  Neck: Normal range of motion. Neck supple.  Cardiovascular: Normal rate and regular rhythm.   No murmur heard. Pulmonary/Chest: Effort  normal and breath sounds normal. He has no wheezes.  Abdominal: Soft. Bowel sounds are normal. There is no tenderness. There is no rebound and no guarding.  Musculoskeletal: Normal range of motion. He exhibits no edema.  Neurological: He is alert and oriented to person, place, and time.  Skin: Skin is warm and dry. No rash noted.  Psychiatric: He has a normal mood and affect.    ED Course  Procedures (including critical care time) Labs Review Labs Reviewed  CBC WITH DIFFERENTIAL - Abnormal; Notable for the following:    RBC 2.35 (*)    Hemoglobin 6.3 (*)    HCT 20.4 (*)    RDW 19.5 (*)    Neutrophils Relative % 79 (*)    All other components within normal limits  PRO B NATRIURETIC PEPTIDE - Abnormal; Notable for the following:    Pro B Natriuretic peptide (BNP) 5942.0 (*)    All other components within normal  limits  I-STAT TROPOININ, ED - Abnormal; Notable for the following:    Troponin i, poc 0.18 (*)    All other components within normal limits  COMPREHENSIVE METABOLIC PANEL  URINALYSIS, ROUTINE W REFLEX MICROSCOPIC  PROTIME-INR  APTT  TYPE AND SCREEN  SAMPLE TO BLOOD BANK  PREPARE RBC (CROSSMATCH)   Results for orders placed during the hospital encounter of 05/02/13  CBC WITH DIFFERENTIAL      Result Value Ref Range   WBC 7.3  4.0 - 10.5 K/uL   RBC 2.35 (*) 4.22 - 5.81 MIL/uL   Hemoglobin 6.3 (*) 13.0 - 17.0 g/dL   HCT 20.4 (*) 39.0 - 52.0 %   MCV 86.8  78.0 - 100.0 fL   MCH 26.8  26.0 - 34.0 pg   MCHC 30.9  30.0 - 36.0 g/dL   RDW 19.5 (*) 11.5 - 15.5 %   Platelets 291  150 - 400 K/uL   Neutrophils Relative % 79 (*) 43 - 77 %   Neutro Abs 5.7  1.7 - 7.7 K/uL   Lymphocytes Relative 15  12 - 46 %   Lymphs Abs 1.1  0.7 - 4.0 K/uL   Monocytes Relative 6  3 - 12 %   Monocytes Absolute 0.4  0.1 - 1.0 K/uL   Eosinophils Relative 0  0 - 5 %   Eosinophils Absolute 0.0  0.0 - 0.7 K/uL   Basophils Relative 0  0 - 1 %   Basophils Absolute 0.0  0.0 - 0.1 K/uL   COMPREHENSIVE METABOLIC PANEL      Result Value Ref Range   Sodium 140  137 - 147 mEq/L   Potassium 4.4  3.7 - 5.3 mEq/L   Chloride 103  96 - 112 mEq/L   CO2 13 (*) 19 - 32 mEq/L   Glucose, Bld 121 (*) 70 - 99 mg/dL   BUN 36 (*) 6 - 23 mg/dL   Creatinine, Ser 1.77 (*) 0.50 - 1.35 mg/dL   Calcium 8.2 (*) 8.4 - 10.5 mg/dL   Total Protein 8.5 (*) 6.0 - 8.3 g/dL   Albumin 2.3 (*) 3.5 - 5.2 g/dL   AST 19  0 - 37 U/L   ALT 17  0 - 53 U/L   Alkaline Phosphatase 56  39 - 117 U/L   Total Bilirubin 1.5 (*) 0.3 - 1.2 mg/dL   GFR calc non Af Amer 34 (*) >90 mL/min   GFR calc Af Amer 39 (*) >90 mL/min  PRO B NATRIURETIC PEPTIDE      Result Value Ref Range   Pro B Natriuretic peptide (BNP) 5942.0 (*) 0 - 450 pg/mL  URINALYSIS, ROUTINE W REFLEX MICROSCOPIC      Result Value Ref Range   Color, Urine YELLOW  YELLOW   APPearance HAZY (*) CLEAR   Specific Gravity, Urine 1.019  1.005 - 1.030   pH 5.0  5.0 - 8.0   Glucose, UA NEGATIVE  NEGATIVE mg/dL   Hgb urine dipstick NEGATIVE  NEGATIVE   Bilirubin Urine NEGATIVE  NEGATIVE   Ketones, ur NEGATIVE  NEGATIVE mg/dL   Protein, ur NEGATIVE  NEGATIVE mg/dL   Urobilinogen, UA 1.0  0.0 - 1.0 mg/dL   Nitrite NEGATIVE  NEGATIVE   Leukocytes, UA MODERATE (*) NEGATIVE  PROTIME-INR      Result Value Ref Range   Prothrombin Time 21.1 (*) 11.6 - 15.2 seconds   INR 1.89 (*) 0.00 - 1.49  APTT      Result  Value Ref Range   aPTT 56 (*) 24 - 37 seconds  URINE MICROSCOPIC-ADD ON      Result Value Ref Range   Squamous Epithelial / LPF FEW (*) RARE   WBC, UA 21-50  <3 WBC/hpf   Bacteria, UA MANY (*) RARE   Casts HYALINE CASTS (*) NEGATIVE  I-STAT TROPOININ, ED      Result Value Ref Range   Troponin i, poc 0.18 (*) 0.00 - 0.08 ng/mL   Comment NOTIFIED PHYSICIAN     Comment 3           TYPE AND SCREEN      Result Value Ref Range   ABO/RH(D) O POS     Antibody Screen PENDING     Sample Expiration 05/05/2013     Dg Chest 2 View  05/02/2013   CLINICAL  DATA:  Worsening shortness of breath.  Ex-smoker.  EXAM: CHEST  2 VIEW  COMPARISON:  01/11/2013.  FINDINGS: Stable enlarged cardiac silhouette. Small amount of airspace opacity at the posterior lung bases on the lateral view, most likely on left, medially on the frontal view. The remainder of the lungs are clear with normal vascularity. Mild thoracic spine degenerative changes. Mild central peribronchial thickening.  IMPRESSION: 1. Mild left lower lobe pneumonia or patchy atelectasis. 2. Mild bronchitic changes. 3. Cardiomegaly.   Electronically Signed   By: Enrique Sack M.D.   On: 05/02/2013 18:50    Imaging Review No results found.   EKG Interpretation   Date/Time:  Friday May 02 2013 16:40:23 EDT Ventricular Rate:  110 PR Interval:    QRS Duration: 89 QT Interval:  374 QTC Calculation: 506 R Axis:   -59 Text Interpretation:  Atrial fibrillation Ventricular premature complex  Left anterior fascicular block Probable anterior infarct, age  indeterminate No significant change since 01/11/13 Confirmed by Beau Fanny  MD,  DOUGLAS ([id number]) on 05/02/2013 5:05:49 PM      MDM   Final diagnoses:  None    1. Anemia 2. Guaiac positive stool 3. Atrial fibrillation, chronic 4. Coagulopathy 5. Elevated troponin  The patient appears comfortable. Initially hypoxic, improved with O2 via nasal canula. No pain at any time. He is found to be profoundly anemic - transfusion ordered. Found to be guaiac positive. GI consulted. Troponin found to elevated, cardiology consulted. Thought to be demand ischemia secondary to anemia. Discussed with [facility] who accepts for admission.    Dewaine Oats, PA-C 05/02/13 [id number]

## 2013-05-02 NOTE — ED Notes (Signed)
Attempted report 

## 2013-05-02 NOTE — H&P (Signed)
[facility] History and Physical  Craig Martin YTK:[id number] DOB: 08/16/1926 DOA: 05/02/2013  Referring physician: EDP PCP: [name], MD  Specialists:  Dr.  Fransico Him  Cardiology   Dr.  Meriel Pica Gastroenterology  Chief Complaint: SOB  HPI: Craig Martin is a 80 y.o. male with a history of CAD, chronic Atrial fibrillation on Pradaxa Rx, DM2, HTN, and Hyperlipidemia who presents to the ED with complaints of SOB and DOE which has been worsening over the past week.   He denied any chest pain fever or chills.  He does report having generalized weakness a fatigue.  He was evalauated in the ED and was found to have  A hemoglobin level of 6.3 and was Guaiac positive on examination.  When asked he about his stools he reports having black stools in the AM, and sometime will see red blood.   He denied having any ABD Pain or Rectal pain.     He was also found to have an elevated troponin level of 0.18 .   He was referred for medical admission and Cardiology and GI were consulted.       Review of Systems:  Constitutional: No Weight Loss, No Weight Gain, Night Sweats, Fevers, Chills, +Fatigue, or +Generalized Weakness HEENT: No Headaches, Difficulty Swallowing,Tooth/Dental Problems,Sore Throat,  No Sneezing, Rhinitis, Ear Ache, Nasal Congestion, or Post Nasal Drip,  Cardio-vascular:  No Chest pain, Orthopnea, PND, Edema in lower extremities, Anasarca, Dizziness, Palpitations  Resp: +Dyspnea, +DOE, No Cough, No Hemoptysis,  No Wheezing.    GI: No Heartburn, Indigestion, Abdominal Pain, Nausea, Vomiting, +Melena, +Hematochezia, No Hematemesis, No Diarrhea, No Loss of Appetite  GU: No Dysuria, Change in Color of Urine, No Urgency or Frequency, No Flank pain.  Musculoskeletal: No Joint Pain or Swelling.  No Decreased Range of Motion,  No Back Pain.  Neurologic: No Syncope, No Seizures, Muscle Weakness, Paresthesia, Vision Disturbance or Loss, No Diplopia, No Vertigo, No Difficulty  Walking,  Skin: No Rash or Lesions. Psych: No Change in Mood or Affect. No Depression or Anxiety. No Memory loss. No Confusion or Hallucinations   Past Medical History  Diagnosis Date  . Glaucoma   . Bradycardia   . Diabetes mellitus   . Hyperlipidemia   . Hypertension   . Shortness of breath     WITH EXERTION  . Stroke     "I had a stroke many yrs ago" -no residual problems  . BPH (benign prostatic hyperplasia)   . Difficulty voiding   . Dysrhythmia     AFIB  - TAKES PRADAXA FOLLOWED BY DR. Gwenlyn Found  . Critical lower limb ischemia   . Melanoma   . Arthritis   . Coronary artery disease       Past Surgical History  Procedure Laterality Date  . Stented coronary artery  2004  . Transurethral resection of prostate  01/24/2012    Procedure: TRANSURETHRAL RESECTION OF THE PROSTATE WITH GYRUS INSTRUMENTS;  Surgeon: Alexis Frock, MD;  Location: [facility];  Service: Urology;  Laterality: N/A;  . Cardiac catheterization  09/19/2002    moderate LAD and RCA and normal LV function  . Nm myocar perf wall motion  05/24/2011    showed inferior scar w/o ischemia  . Doppler echocardiography  05/24/2011    EF >55%  . Doppler echocardiography  09/28/2009    EF >55% lvh w/ left atrial enlargement,diastolic dysfunctio,elevated lv filling pressure  . Nm myocar perf wall motion  09/09/2009    mild ischemia mid anterior,apical  anterior and basal inferolateral  . Nm myocar perf wall motion  12/04/2002  . Nm myocar perf wall motion  05/21/2002  . Cardiac catheterization  07/03/2002  . Cataract extraction    . Melanoma excision Right 10/21/2012    Procedure: MELANOMA EXCISION;  Surgeon: Zenovia Jarred, MD;  Location: [place];  Service: General;  Laterality: Right;  . Inguinal lymph node biopsy Right 10/21/2012    Procedure: INGUINAL LYMPH NODE BIOPSY;  Surgeon: Zenovia Jarred, MD;  Location: [place];  Service: General;  Laterality: Right;  . Application of a-cell of extremity Right 10/21/2012     Procedure: APPLICATION OF A-CELL OF EXTREMITY;  Surgeon: Theodoro Kos, DO;  Location: [place];  Service: Plastics;  Laterality: Right;  . Incision and drainage of wound Right 12/04/2012    Procedure: IRRIGATION AND DEBRIDEMENT RIGHT FOOT WOUND WITH PLACEMENT OF ACELL/VAC;  Surgeon: Theodoro Kos, DO;  Location: [place];  Service: Plastics;  Laterality: Right;       Prior to Admission medications   Medication Sig Start Date End Date Taking? Authorizing Provider  acetaminophen (TYLENOL) 500 MG tablet Take 1,000 mg by mouth every 6 (six) hours as needed.   Yes Historical Provider, MD  aspirin 81 MG tablet Take 81 mg by mouth daily.   Yes Historical Provider, MD  bimatoprost (LUMIGAN) 0.01 % SOLN Place 1 drop into both eyes at bedtime.   Yes Historical Provider, MD  dabigatran (PRADAXA) 75 MG CAPS capsule Take 75 mg by mouth daily. 01/12/13  Yes Estela Leonie Green, MD  docusate sodium 100 MG CAPS Take 100 mg by mouth 2 (two) times daily. 12/05/12  Yes Shawn Rayburn, PA-C  furosemide (LASIX) 20 MG tablet Take 20 mg by mouth daily.   Yes Historical Provider, MD  linagliptin (TRADJENTA) 5 MG TABS tablet Take 1 tablet (5 mg total) by mouth daily. 01/12/13  Yes Estela Leonie Green, MD  losartan (COZAAR) 25 MG tablet Take 1 tablet (25 mg total) by mouth daily. 01/12/13  Yes Estela Leonie Green, MD  traMADol (ULTRAM) 50 MG tablet Take 50 mg by mouth every 6 (six) hours as needed for moderate pain.   Yes Historical Provider, MD      No Known Allergies   Social History:  reports that he quit smoking about 27 years ago. His smoking use included Cigarettes. He smoked 1.50 packs per day. He has quit using smokeless tobacco. He reports that he does not drink alcohol or use illicit drugs.     Family History  Problem Relation Age of Onset  . Diabetes Mother   . Diabetes Brother        Physical Exam:  GEN:  Pleasant Elderly Well Nourished and Well Developed  79 y.o.  African American male  examined  and in no acute distress; cooperative with exam Filed Vitals:   05/02/13 1945 05/02/13 1959 05/02/13 2000 05/02/13 2100  BP: 139/91  136/73 120/76  Pulse: 73   118  Temp:  97.6 F (36.4 C)  97.3 F (36.3 C)  TempSrc:  Oral  Oral  Resp: 19  25 18   Height:    5\' 9"  (1.753 m)  Weight:    79.379 kg (175 lb)  SpO2: 93%   98%   Blood pressure 120/76, pulse 118, temperature 97.3 F (36.3 C), temperature source Oral, resp. rate 18, height 5\' 9"  (1.753 m), weight 79.379 kg (175 lb), SpO2 98.00%. PSYCH: He is alert and oriented x4;  does not appear anxious does not appear depressed; affect is normal HEENT: Normocephalic and Atraumatic, Mucous membranes pink; PERRLA; EOM intact; Fundi:  Benign;  No scleral icterus, Nares: Patent, Oropharynx: Clear, Fair Dentition, Neck:  FROM, no cervical lymphadenopathy nor thyromegaly or carotid bruit; no JVD; Breasts:: Not examined CHEST WALL: No tenderness CHEST: Normal respiration, clear to auscultation bilaterally HEART: Regular rate and rhythm; no murmurs rubs or gallops BACK: No kyphosis or scoliosis; no CVA tenderness ABDOMEN: Positive Bowel Sounds, Obese, soft non-tender; no masses, no organomegaly.    Rectal Exam: Not done EXTREMITIES: + Surgical Wound Right Foot,  Dressed and Clean dry and Intact.    Otherwise No bone or joint deformity; No cyanosis, clubbing or edema.   Genitalia: not examined PULSES: 2+ and symmetric SKIN: Normal hydration no rash;  +Ulcer Right Foot.   CNS:  Alert and Oriented x 4 , No Focal Deficits.    Vascular: pulses palpable throughout    Labs on Admission:  Basic Metabolic Panel:  Recent Labs Lab 05/02/13 1746  NA 140  K 4.4  CL 103  CO2 13*  GLUCOSE 121*  BUN 36*  CREATININE 1.77*  CALCIUM 8.2*   Liver Function Tests:  Recent Labs Lab 05/02/13 1746  AST 19  ALT 17  ALKPHOS 56  BILITOT 1.5*  PROT 8.5*  ALBUMIN 2.3*   No results found for this basename: LIPASE,  AMYLASE,  in the last 168 hours No results found for this basename: AMMONIA,  in the last 168 hours CBC:  Recent Labs Lab 05/02/13 1746  WBC 7.3  NEUTROABS 5.7  HGB 6.3*  HCT 20.4*  MCV 86.8  PLT 291   Cardiac Enzymes: No results found for this basename: CKTOTAL, CKMB, CKMBINDEX, TROPONINI,  in the last 168 hours  BNP (last 3 results)  Recent Labs  01/11/13 0145 05/02/13 1746  PROBNP 6537.0* 5942.0*   CBG:  Recent Labs Lab 04/28/13 1119  GLUCAP 180*    Radiological Exams on Admission: Dg Chest 2 View  05/02/2013   CLINICAL DATA:  Worsening shortness of breath.  Ex-smoker.  EXAM: CHEST  2 VIEW  COMPARISON:  01/11/2013.  FINDINGS: Stable enlarged cardiac silhouette. Small amount of airspace opacity at the posterior lung bases on the lateral view, most likely on left, medially on the frontal view. The remainder of the lungs are clear with normal vascularity. Mild thoracic spine degenerative changes. Mild central peribronchial thickening.  IMPRESSION: 1. Mild left lower lobe pneumonia or patchy atelectasis. 2. Mild bronchitic changes. 3. Cardiomegaly.   Electronically Signed   By: Enrique Sack M.D.   On: 05/02/2013 18:50      EKG: Independently reviewed. Atrial Fibrillation rate 110,     Assessment/Plan:   80 y.o. male with  Principal Problem:   Anemia due to blood loss Active Problems:   GI bleed   SOB (shortness of breath)   Elevated troponin   Coagulopathy   Atrial fibrillation   Chronic diastolic heart failure   Coronary artery disease   Essential hypertension   Hyperlipidemia   Diabetes   Malignant melanoma of right foot     CKD    1.   Anemia due to Blood Loss-  Gi Bleed,   Transfuse 2 units, Monitor H/Hs,  IV Protonix,  Discontinue Pradaxa for now.   GI consult in  AM Dr. Collene Mares to see.    2.   GI Bleed-  See #2.    3.   SOB - due to #  1.     4.  Elevated Troponin-  Due to Cardia Strain and Tachycardia,  Continue to cycle Troponins,  Cardiology  Consultant saw patient in Ed Dr. Fransico Him.   See Note.    5.  Coagulopathy- due to Pradaxa Rx,  Pradaxa and Aspirin Rx discontinued for now due to #2.    6.   Atrial Fibrillation-  Chronic, now with mild RVR ,  Transfuse to increase volume which should resolve the tachycardia.  Monitor.  NO beta blocker for now,  And Pradaxa and Aspirin RX on hold for now.     7.   Chronic Diastolic CHF- on daily Lasix, and ARB Rx.   Lasix also being given between units of blood transfuse to prevent overload.     8.   CAD-  Hx,  See # 4.    9.   HTN-  Monitor BPs,  On lasix and Losartan Rx for HTN.      10.  DM2-  Hold Tadjenta for now SSI coverage PRN,a dn Check HbA1c in AM.     11.  Malignant Melanoma of Right Foot S/P  Recent Excision-  Wound Care.     12.   CKD-  Monitor BUN/Cr,  Has some degree of CKD compound with Influence of an Upper GI Bleed.     12.   DVT prophylaxis with SCDs.        Code Status:   FULL CODE Family Communication:    No Family at Bedside Disposition Plan:    Inpatient     Time spent:  [facility] Pager [phone]  If 7PM-7AM, please contact night-coverage [email or web address] Password [facility] 05/02/2013, 9:48 PM

## 2013-05-02 NOTE — Consult Note (Signed)
Admit date: 05/02/2013 Referring Physician  Dr. Stark Jock Primary Cardiologist  Dr. Sallyanne Kuster Reason for Consultation  Elevated troponin and afib with RVR  HPI: This is a 80 y.o. male with a past medical history significant for PAD and CAD s/p PCI to the LCx in 2004 by Dr. Tami Ribas and moderate LAD and RCA disease. He recently underwent a melanoma excision and lymph node biopsy in Dec 2014. At that time he was noted to have post-op a-fib and PVC's. He also had NSVT - a few runs of 8-9 beats, generally asymptomatic. EKG showed a-fib, LAFB, and Right bundle morphology PVC's, suggesting an LV origin. His echocardiogram at his last admission was normal, with EF of 60%. Dr. Debara Pickett saw him in December in consultation and b-blocker.  He was readmitted in January 2015 with SOB and was found to be in acute on chronic diastolic CHF after running out of his meds.  He was diuresed and sent home.  Of note he has had chronic anemia with a hemoglobin in the 7 range with no evidence of GI bleeding and is on chronic Pradaxa for chronic afib.  Today he presents to the ER with complaints of increased SOB over several days with increased LE edema.  He denies any chest pain.  He was found in the ER to be mildly tachycardic with an elevated troponin at 0.18 and elevated BNP at 5942 c/w acute on chronic diastolic CHF.  He was also found to have a hemoglobin of 6.3.  His Hbg 12/04/2012 was 10.5 and 7.6 on 01/11/2013.  His chest xray shows a LLL PNA.  Cardiology is now asked to consult for elevated troponin.      PMH:   Past Medical History  Diagnosis Date  . Glaucoma   . Bradycardia   . Diabetes mellitus   . Hyperlipidemia   . Hypertension   . Shortness of breath     WITH EXERTION  . Stroke     "I had a stroke many yrs ago" -no residual problems  . BPH (benign prostatic hyperplasia)   . Difficulty voiding   . Dysrhythmia     AFIB  - TAKES PRADAXA FOLLOWED BY DR. Gwenlyn Found  . Critical lower limb ischemia   . Melanoma   .  Arthritis   . Coronary artery disease      PSH:   Past Surgical History  Procedure Laterality Date  . Stented coronary artery  2004  . Transurethral resection of prostate  01/24/2012    Procedure: TRANSURETHRAL RESECTION OF THE PROSTATE WITH GYRUS INSTRUMENTS;  Surgeon: Alexis Frock, MD;  Location: [facility];  Service: Urology;  Laterality: N/A;  . Cardiac catheterization  09/19/2002    moderate LAD and RCA and normal LV function  . Nm myocar perf wall motion  05/24/2011    showed inferior scar w/o ischemia  . Doppler echocardiography  05/24/2011    EF >55%  . Doppler echocardiography  09/28/2009    EF >55% lvh w/ left atrial enlargement,diastolic dysfunctio,elevated lv filling pressure  . Nm myocar perf wall motion  09/09/2009    mild ischemia mid anterior,apical anterior and basal inferolateral  . Nm myocar perf wall motion  12/04/2002  . Nm myocar perf wall motion  05/21/2002  . Cardiac catheterization  07/03/2002  . Cataract extraction    . Melanoma excision Right 10/21/2012    Procedure: MELANOMA EXCISION;  Surgeon: Zenovia Jarred, MD;  Location: [place];  Service: General;  Laterality: Right;  . Inguinal lymph  node biopsy Right 10/21/2012    Procedure: INGUINAL LYMPH NODE BIOPSY;  Surgeon: Zenovia Jarred, MD;  Location: [place];  Service: General;  Laterality: Right;  . Application of a-cell of extremity Right 10/21/2012    Procedure: APPLICATION OF A-CELL OF EXTREMITY;  Surgeon: Theodoro Kos, DO;  Location: [place];  Service: Plastics;  Laterality: Right;  . Incision and drainage of wound Right 12/04/2012    Procedure: IRRIGATION AND DEBRIDEMENT RIGHT FOOT WOUND WITH PLACEMENT OF ACELL/VAC;  Surgeon: Theodoro Kos, DO;  Location: [place];  Service: Plastics;  Laterality: Right;    Allergies:  Review of patient's allergies indicates no known allergies. Prior to Admit Meds:   (Not in a hospital admission) Fam HX:    Family History  Problem Relation Age of Onset    . Diabetes Mother   . Diabetes Brother    Social HX:    History   Social History  . Marital Status: Single    Spouse Name: N/A    Number of Children: N/A  . Years of Education: N/A   Occupational History  . Not on file.   Social History Main Topics  . Smoking status: Former Smoker -- 1.50 packs/day    Types: Cigarettes    Quit date: 01/16/1986  . Smokeless tobacco: Former Systems developer  . Alcohol Use: No  . Drug Use: No  . Sexual Activity: No   Other Topics Concern  . Not on file   Social History Narrative  . No narrative on file     ROS:  All 11 ROS were addressed and are negative except what is stated in the HPI  Physical Exam: Blood pressure 131/76, pulse 33, temperature 97.6 F (36.4 C), temperature source Oral, resp. rate 28, SpO2 89.00%.    General: Well developed, well nourished, in no acute distress Head: Eyes PERRLA, No xanthomas.   Normal cephalic and atramatic  Lungs:   Crackles at bases bilaterally worse on the left Heart:   Irregularly irregular S1 S2 Pulses are 2+ & equal.            No carotid bruit. No JVD.  No abdominal bruits. No femoral bruits. Abdomen: Bowel sounds are positive, abdomen soft and non-tender without masses  Extremities:   Trace edema of LLE and RLE bandaged from melanoma surgery Neuro: Alert and oriented X 3. Psych:  Good affect, responds appropriately    Labs:   Lab Results  Component Value Date   WBC 7.3 05/02/2013   HGB 6.3* 05/02/2013   HCT 20.4* 05/02/2013   MCV 86.8 05/02/2013   PLT 291 05/02/2013    Recent Labs Lab 05/02/13 1746  NA 140  K 4.4  CL 103  CO2 13*  BUN 36*  CREATININE 1.77*  CALCIUM 8.2*  PROT 8.5*  BILITOT 1.5*  ALKPHOS 56  ALT 17  AST 19  GLUCOSE 121*   No results found for this basename: PTT   Lab Results  Component Value Date   INR 1.09 01/24/2012   INR 1.42 01/17/2012   Lab Results  Component Value Date   TROPONINI <0.30 01/11/2013     No results found for this basename: CHOL   No results  found for this basename: HDL   No results found for this basename: LDLCALC   No results found for this basename: TRIG   No results found for this basename: CHOLHDL   No results found for this basename: LDLDIRECT      Radiology:  Dg Chest 2 View  05/02/2013   CLINICAL DATA:  Worsening shortness of breath.  Ex-smoker.  EXAM: CHEST  2 VIEW  COMPARISON:  01/11/2013.  FINDINGS: Stable enlarged cardiac silhouette. Small amount of airspace opacity at the posterior lung bases on the lateral view, most likely on left, medially on the frontal view. The remainder of the lungs are clear with normal vascularity. Mild thoracic spine degenerative changes. Mild central peribronchial thickening.  IMPRESSION: 1. Mild left lower lobe pneumonia or patchy atelectasis. 2. Mild bronchitic changes. 3. Cardiomegaly.   Electronically Signed   By: Enrique Sack M.D.   On: 05/02/2013 18:50    EKG:  Atrial fibrillation at 110bpm with anterior infarct, PVC and nonspecific ST abnormality  ASSESSMENT:  1.  Chronic atrial fibrillation now with mildly increased rate secondary to underlying PNA and significant anemia. 2.  Acute on chronic diastolic CHF most likely secondary exacerbated by PNA and anemia 3.  LLL Pnemonia 4.  Significant anemia which may be multifactorial from slow blood loss from melanoma resection vs GI loss.   5.  Elevated troponin secondary to demand ischemia in the setting of profound PNA, acute CHF and severe anemia  PLAN:   1.  Would not try to suppress HR at this time since he is very anemic and needs the higher HR.   2.  Would hold Pradaxa for now 3.  Transfusion of PRBCs per IM 4.  Check iron studies, heme check stools.   5.  Would get GI consult since he needs to be on long term anticoagulation for his afib. 6.  Check 2D echo 7.  Continue to cycle cardiac enzymes  Sueanne Margarita, MD  05/02/2013  6:57 PM

## 2013-05-02 NOTE — ED Notes (Signed)
Pt in from home c/o SOB onset x 1 wk worsening, denies CP, n/v/d, c/o productive cough, in a fib  In route rate 100-140 without A fib hx, a&o x4, follows commands, speaks in complete sentences

## 2013-05-02 NOTE — ED Notes (Signed)
hgb value of 6.3 reported to Anderson Malta, and primary RN

## 2013-05-03 DIAGNOSIS — D500 Iron deficiency anemia secondary to blood loss (chronic): Secondary | ICD-10-CM

## 2013-05-03 DIAGNOSIS — I369 Nonrheumatic tricuspid valve disorder, unspecified: Secondary | ICD-10-CM

## 2013-05-03 LAB — BLOOD GAS, ARTERIAL
Acid-base deficit: 3.7 mmol/L — ABNORMAL HIGH (ref 0.0–2.0)
Bicarbonate: 19.9 mEq/L — ABNORMAL LOW (ref 20.0–24.0)
DRAWN BY: 313941
O2 CONTENT: 2 L/min
O2 Saturation: 98.6 %
PCO2 ART: 30.3 mmHg — AB (ref 35.0–45.0)
PO2 ART: 117 mmHg — AB (ref 80.0–100.0)
Patient temperature: 98.6
TCO2: 20.8 mmol/L (ref 0–100)
pH, Arterial: 7.432 (ref 7.350–7.450)

## 2013-05-03 LAB — VITAMIN B12: VITAMIN B 12: 1621 pg/mL — AB (ref 211–911)

## 2013-05-03 LAB — GLUCOSE, CAPILLARY
GLUCOSE-CAPILLARY: 172 mg/dL — AB (ref 70–99)
GLUCOSE-CAPILLARY: 154 mg/dL — AB (ref 70–99)
Glucose-Capillary: 162 mg/dL — ABNORMAL HIGH (ref 70–99)

## 2013-05-03 LAB — BASIC METABOLIC PANEL
BUN: 41 mg/dL — ABNORMAL HIGH (ref 6–23)
CALCIUM: 8.2 mg/dL — AB (ref 8.4–10.5)
CO2: 15 mEq/L — ABNORMAL LOW (ref 19–32)
Chloride: 109 mEq/L (ref 96–112)
Creatinine, Ser: 1.82 mg/dL — ABNORMAL HIGH (ref 0.50–1.35)
GFR calc Af Amer: 38 mL/min — ABNORMAL LOW (ref >90)
GFR, EST NON AFRICAN AMERICAN: 32 mL/min — AB (ref >90)
Glucose, Bld: 132 mg/dL — ABNORMAL HIGH (ref 70–99)
Potassium: 4.5 mEq/L (ref 3.7–5.3)
SODIUM: 143 meq/L (ref 137–147)

## 2013-05-03 LAB — FERRITIN: Ferritin: 119 ng/mL (ref 22–322)

## 2013-05-03 LAB — IRON AND TIBC
Iron: 26 ug/dL — ABNORMAL LOW (ref 42–135)
Saturation Ratios: 9 % — ABNORMAL LOW (ref 20–55)
TIBC: 292 ug/dL (ref 215–435)
UIBC: 266 ug/dL (ref 125–400)

## 2013-05-03 LAB — TROPONIN I
TROPONIN I: 0.53 ng/mL — AB (ref <0.30)
TROPONIN I: 0.74 ng/mL — AB (ref <0.30)

## 2013-05-03 LAB — FOLATE

## 2013-05-03 LAB — PREPARE RBC (CROSSMATCH)

## 2013-05-03 LAB — HEMOGLOBIN AND HEMATOCRIT, BLOOD
HEMATOCRIT: 24.2 % — AB (ref 39.0–52.0)
Hemoglobin: 7.8 g/dL — ABNORMAL LOW (ref 13.0–17.0)

## 2013-05-03 MED ORDER — DIPHENHYDRAMINE HCL 25 MG PO CAPS
25.0000 mg | ORAL_CAPSULE | Freq: Once | ORAL | Status: AC
  Administered 2013-05-03: 25 mg via ORAL
  Filled 2013-05-03: qty 1

## 2013-05-03 MED ORDER — INSULIN ASPART 100 UNIT/ML ~~LOC~~ SOLN
0.0000 [IU] | SUBCUTANEOUS | Status: DC
  Administered 2013-05-03 – 2013-05-04 (×4): 2 [IU] via SUBCUTANEOUS
  Administered 2013-05-05 – 2013-05-06 (×3): 1 [IU] via SUBCUTANEOUS
  Administered 2013-05-06: 2 [IU] via SUBCUTANEOUS

## 2013-05-03 MED ORDER — ACETAMINOPHEN 325 MG PO TABS
650.0000 mg | ORAL_TABLET | Freq: Once | ORAL | Status: AC
Start: 2013-05-03 — End: 2013-05-03
  Administered 2013-05-03: 650 mg via ORAL
  Filled 2013-05-03: qty 2

## 2013-05-03 MED ORDER — FUROSEMIDE 10 MG/ML IJ SOLN
20.0000 mg | Freq: Once | INTRAMUSCULAR | Status: AC
  Administered 2013-05-03: 20 mg via INTRAVENOUS

## 2013-05-03 NOTE — Consult Note (Addendum)
Cross cover for [employer] patient Reason for Consult: Anemia with guaiac positive stool. Referring Physician: THP  Craig Martin is an 80 y.o. male.  HPI: 80 year old black male, with multiple medical issues listed below, presented to the ER with worsening shortness of breath and DOE. He is unable to give me any details on his medical history. He was found to have anemia with guaiac positive stools on evaluation in the ER. He is on Pradaxa for chronic atrial fibrillation. He received 2 units of PBRC's on admission. He denies any active GI complaints but seems to be a poor historian.    Past Medical History  Diagnosis Date  . Glaucoma   . Bradycardia   . Diabetes mellitus   . Hyperlipidemia   . Hypertension   . Shortness of breath     WITH EXERTION  . Stroke     "I had a stroke many yrs ago" -no residual problems  . BPH (benign prostatic hyperplasia)   . Difficulty voiding   . Dysrhythmia     Atrial fibrillation followed by Dr. Quay Burow  . Critical lower limb ischemia   . Melanoma   . Arthritis   . Coronary artery disease    Past Surgical History  Procedure Laterality Date  . Stented coronary artery  2004  . Transurethral resection of prostate  01/24/2012    Procedure: TRANSURETHRAL RESECTION OF THE PROSTATE WITH GYRUS INSTRUMENTS;  Surgeon: Alexis Frock, MD;  Location: [facility];  Service: Urology;  Laterality: N/A;  . Cardiac catheterization  09/19/2002    moderate LAD and RCA and normal LV function  . Nm myocar perf wall motion  05/24/2011    showed inferior scar w/o ischemia  . Doppler echocardiography  05/24/2011    EF >55%  . Doppler echocardiography  09/28/2009    EF >55% lvh w/ left atrial enlargement,diastolic dysfunctio,elevated lv filling pressure  . Nm myocar perf wall motion  09/09/2009    mild ischemia mid anterior,apical anterior and basal inferolateral  . Nm myocar perf wall motion  12/04/2002  . Nm myocar perf wall motion  05/21/2002  . Cardiac  catheterization  07/03/2002  . Cataract extraction    . Melanoma excision Right 10/21/2012    Procedure: MELANOMA EXCISION;  Surgeon: Zenovia Jarred, MD;  Location: [place];  Service: General;  Laterality: Right;  . Inguinal lymph node biopsy Right 10/21/2012    Procedure: INGUINAL LYMPH NODE BIOPSY;  Surgeon: Zenovia Jarred, MD;  Location: [place];  Service: General;  Laterality: Right;  . Application of a-cell of extremity Right 10/21/2012    Procedure: APPLICATION OF A-CELL OF EXTREMITY;  Surgeon: Theodoro Kos, DO;  Location: [place];  Service: Plastics;  Laterality: Right;  . Incision and drainage of wound Right 12/04/2012    Procedure: IRRIGATION AND DEBRIDEMENT RIGHT FOOT WOUND WITH PLACEMENT OF ACELL/VAC;  Surgeon: Theodoro Kos, DO;  Location: [place];  Service: Plastics;  Laterality: Right;   Family History  Problem Relation Age of Onset  . Diabetes Mother   . Diabetes Brother    Social History:  reports that he quit smoking about 27 years ago. His smoking use included Cigarettes. He smoked 1.50 packs per day. He has quit using smokeless tobacco. He reports that he does not drink alcohol or use illicit drugs.  Allergies: No Known Allergies  Medications: I have reviewed the patient's current medications.  Results for orders placed during the hospital encounter of 05/02/13 (from the  past 48 hour(s))  CBC WITH DIFFERENTIAL     Status: Abnormal   Collection Time    05/02/13  5:46 PM      Result Value Ref Range   WBC 7.3  4.0 - 10.5 K/uL   RBC 2.35 (*) 4.22 - 5.81 MIL/uL   Hemoglobin 6.3 (*) 13.0 - 17.0 g/dL   Comment: REPEATED TO VERIFY     CRITICAL RESULT CALLED TO, READ BACK BY AND VERIFIED WITH:     Sandria Senter RN [id number] 05/02/13 A BROWNING   HCT 20.4 (*) 39.0 - 52.0 %   MCV 86.8  78.0 - 100.0 fL   MCH 26.8  26.0 - 34.0 pg   MCHC 30.9  30.0 - 36.0 g/dL   RDW 19.5 (*) 11.5 - 15.5 %   Platelets 291  150 - 400 K/uL   Neutrophils Relative % 79 (*) 43 - 77 %    Neutro Abs 5.7  1.7 - 7.7 K/uL   Lymphocytes Relative 15  12 - 46 %   Lymphs Abs 1.1  0.7 - 4.0 K/uL   Monocytes Relative 6  3 - 12 %   Monocytes Absolute 0.4  0.1 - 1.0 K/uL   Eosinophils Relative 0  0 - 5 %   Eosinophils Absolute 0.0  0.0 - 0.7 K/uL   Basophils Relative 0  0 - 1 %   Basophils Absolute 0.0  0.0 - 0.1 K/uL  COMPREHENSIVE METABOLIC PANEL     Status: Abnormal   Collection Time    05/02/13  5:46 PM      Result Value Ref Range   Sodium 140  137 - 147 mEq/L   Potassium 4.4  3.7 - 5.3 mEq/L   Chloride 103  96 - 112 mEq/L   CO2 13 (*) 19 - 32 mEq/L   Glucose, Bld 121 (*) 70 - 99 mg/dL   BUN 36 (*) 6 - 23 mg/dL   Creatinine, Ser 1.77 (*) 0.50 - 1.35 mg/dL   Calcium 8.2 (*) 8.4 - 10.5 mg/dL   Total Protein 8.5 (*) 6.0 - 8.3 g/dL   Albumin 2.3 (*) 3.5 - 5.2 g/dL   AST 19  0 - 37 U/L   ALT 17  0 - 53 U/L   Alkaline Phosphatase 56  39 - 117 U/L   Total Bilirubin 1.5 (*) 0.3 - 1.2 mg/dL   GFR calc non Af Amer 34 (*) >90 mL/min   GFR calc Af Amer 39 (*) >90 mL/min   Comment: (NOTE)     The eGFR has been calculated using the CKD EPI equation.     This calculation has not been validated in all clinical situations.     eGFR's persistently <90 mL/min signify possible Chronic Kidney     Disease.  PRO B NATRIURETIC PEPTIDE     Status: Abnormal   Collection Time    05/02/13  5:46 PM      Result Value Ref Range   Pro B Natriuretic peptide (BNP) 5942.0 (*) 0 - 450 pg/mL  I-STAT TROPOININ, ED     Status: Abnormal   Collection Time    05/02/13  5:57 PM      Result Value Ref Range   Troponin i, poc 0.18 (*) 0.00 - 0.08 ng/mL   Comment NOTIFIED PHYSICIAN     Comment 3            Comment: Due to the release kinetics of cTnI,     a negative result  within the first hours     of the onset of symptoms does not rule out     myocardial infarction with certainty.     If myocardial infarction is still suspected,     repeat the test at appropriate intervals.  URINALYSIS, ROUTINE W  REFLEX MICROSCOPIC     Status: Abnormal   Collection Time    05/02/13  6:08 PM      Result Value Ref Range   Color, Urine YELLOW  YELLOW   APPearance HAZY (*) CLEAR   Specific Gravity, Urine 1.019  1.005 - 1.030   pH 5.0  5.0 - 8.0   Glucose, UA NEGATIVE  NEGATIVE mg/dL   Hgb urine dipstick NEGATIVE  NEGATIVE   Bilirubin Urine NEGATIVE  NEGATIVE   Ketones, ur NEGATIVE  NEGATIVE mg/dL   Protein, ur NEGATIVE  NEGATIVE mg/dL   Urobilinogen, UA 1.0  0.0 - 1.0 mg/dL   Nitrite NEGATIVE  NEGATIVE   Leukocytes, UA MODERATE (*) NEGATIVE  URINE MICROSCOPIC-ADD ON     Status: Abnormal   Collection Time    05/02/13  6:08 PM      Result Value Ref Range   Squamous Epithelial / LPF FEW (*) RARE   WBC, UA 21-50  <3 WBC/hpf   Bacteria, UA MANY (*) RARE   Casts HYALINE CASTS (*) NEGATIVE  PROTIME-INR     Status: Abnormal   Collection Time    05/02/13  6:50 PM      Result Value Ref Range   Prothrombin Time 21.1 (*) 11.6 - 15.2 seconds   INR 1.89 (*) 0.00 - 1.49  APTT     Status: Abnormal   Collection Time    05/02/13  6:50 PM      Result Value Ref Range   aPTT 56 (*) 24 - 37 seconds   Comment:            IF BASELINE aPTT IS ELEVATED,     SUGGEST PATIENT RISK ASSESSMENT     BE USED TO DETERMINE APPROPRIATE     ANTICOAGULANT THERAPY.  PREPARE RBC (CROSSMATCH)     Status: None   Collection Time    05/02/13  6:50 PM      Result Value Ref Range   Order Confirmation ORDER PROCESSED BY BLOOD BANK    TYPE AND SCREEN     Status: None   Collection Time    05/02/13  7:00 PM      Result Value Ref Range   ABO/RH(D) O POS     Antibody Screen NEG     Sample Expiration 05/05/2013     Unit Number [id number]     Blood Component Type RBC LR PHER1     Unit division 00     Status of Unit ISSUED     Transfusion Status OK TO TRANSFUSE     Crossmatch Result Compatible     Unit Number [id number]     Blood Component Type RED CELLS,LR     Unit division 00     Status of Unit ISSUED      Transfusion Status OK TO TRANSFUSE     Crossmatch Result Compatible    ABO/RH     Status: None   Collection Time    05/02/13  7:00 PM      Result Value Ref Range   ABO/RH(D) O POS    IRON AND TIBC     Status: Abnormal   Collection Time    05/02/13 11:00 PM  Result Value Ref Range   Iron 26 (*) 42 - 135 ug/dL   TIBC 292  215 - 435 ug/dL   Saturation Ratios 9 (*) 20 - 55 %   UIBC 266  125 - 400 ug/dL   Comment: Performed at [facility]     Status: Abnormal   Collection Time    05/02/13 11:00 PM      Result Value Ref Range   Retic Ct Pct 3.4 (*) 0.4 - 3.1 %   RBC. 2.34 (*) 4.22 - 5.81 MIL/uL   Retic Count, Manual 79.6  19.0 - 186.0 K/uL  TROPONIN I     Status: Abnormal   Collection Time    05/02/13 11:00 PM      Result Value Ref Range   Troponin I 0.34 (*) <0.30 ng/mL   Comment:            Due to the release kinetics of cTnI,     a negative result within the first hours     of the onset of symptoms does not rule out     myocardial infarction with certainty.     If myocardial infarction is still suspected,     repeat the test at appropriate intervals.     CRITICAL RESULT CALLED TO, READ BACK BY AND VERIFIED WITH:     GARNETT E,RN 05/02/13 2[address] [place]  HEMOGLOBIN AND HEMATOCRIT, BLOOD     Status: Abnormal   Collection Time    05/02/13 11:00 PM      Result Value Ref Range   Hemoglobin 6.2 (*) 13.0 - 17.0 g/dL   Comment: REPEATED TO VERIFY     CRITICAL RESULT CALLED TO, READ BACK BY AND VERIFIED WITH:     Royetta Asal RN [id number] GREEN R   HCT 19.1 (*) 39.0 - 52.0 %  TROPONIN I     Status: Abnormal   Collection Time    05/03/13  4:07 AM      Result Value Ref Range   Troponin I 0.53 (*) <0.30 ng/mL   Comment:            Due to the release kinetics of cTnI,     a negative result within the first hours     of the onset of symptoms does not rule out     myocardial infarction with certainty.     If myocardial infarction is still suspected,      repeat the test at appropriate intervals.     CRITICAL VALUE NOTED.  VALUE IS CONSISTENT WITH PREVIOUSLY REPORTED AND CALLED VALUE.  BASIC METABOLIC PANEL     Status: Abnormal   Collection Time    05/03/13  4:09 AM      Result Value Ref Range   Sodium 143  137 - 147 mEq/L   Potassium 4.5  3.7 - 5.3 mEq/L   Chloride 109  96 - 112 mEq/L   CO2 15 (*) 19 - 32 mEq/L   Glucose, Bld 132 (*) 70 - 99 mg/dL   BUN 41 (*) 6 - 23 mg/dL   Creatinine, Ser 1.82 (*) 0.50 - 1.35 mg/dL   Calcium 8.2 (*) 8.4 - 10.5 mg/dL   GFR calc non Af Amer 32 (*) >90 mL/min   GFR calc Af Amer 38 (*) >90 mL/min   Comment: (NOTE)     The eGFR has been calculated using the CKD EPI equation.     This calculation has not been validated in  all clinical situations.     eGFR's persistently <90 mL/min signify possible Chronic Kidney     Disease.  TROPONIN I     Status: Abnormal   Collection Time    05/03/13  9:35 AM      Result Value Ref Range   Troponin I 0.74 (*) <0.30 ng/mL   Comment:            Due to the release kinetics of cTnI,     a negative result within the first hours     of the onset of symptoms does not rule out     myocardial infarction with certainty.     If myocardial infarction is still suspected,     repeat the test at appropriate intervals.     CRITICAL VALUE NOTED.  VALUE IS CONSISTENT WITH PREVIOUSLY REPORTED AND CALLED VALUE.  BLOOD GAS, ARTERIAL     Status: Abnormal   Collection Time    05/03/13 10:35 AM      Result Value Ref Range   O2 Content 2.0     Delivery systems NASAL CANNULA     pH, Arterial 7.432  7.350 - 7.450   pCO2 arterial 30.3 (*) 35.0 - 45.0 mmHg   pO2, Arterial 117.0 (*) 80.0 - 100.0 mmHg   Bicarbonate 19.9 (*) 20.0 - 24.0 mEq/L   TCO2 20.8  0 - 100 mmol/L   Acid-base deficit 3.7 (*) 0.0 - 2.0 mmol/L   O2 Saturation 98.6     Patient temperature 98.6     Collection site RIGHT RADIAL     Drawn by [phone]     Sample type ARTERIAL DRAW     Allens test (pass/fail) PASS  PASS   GLUCOSE, CAPILLARY     Status: Abnormal   Collection Time    05/03/13 11:26 AM      Result Value Ref Range   Glucose-Capillary 162 (*) 70 - 99 mg/dL  HEMOGLOBIN AND HEMATOCRIT, BLOOD     Status: Abnormal   Collection Time    05/03/13  2:00 PM      Result Value Ref Range   Hemoglobin 7.8 (*) 13.0 - 17.0 g/dL   Comment: POST TRANSFUSION SPECIMEN   HCT 24.2 (*) 39.0 - 52.0 %   Dg Chest 2 View  05/02/2013   CLINICAL DATA:  Worsening shortness of breath.  Ex-smoker.  EXAM: CHEST  2 VIEW  COMPARISON:  01/11/2013.  FINDINGS: Stable enlarged cardiac silhouette. Small amount of airspace opacity at the posterior lung bases on the lateral view, most likely on left, medially on the frontal view. The remainder of the lungs are clear with normal vascularity. Mild thoracic spine degenerative changes. Mild central peribronchial thickening.  IMPRESSION: 1. Mild left lower lobe pneumonia or patchy atelectasis. 2. Mild bronchitic changes. 3. Cardiomegaly.   Electronically Signed   By: Enrique Sack M.D.   On: 05/02/2013 18:50   Review of Systems  Unable to perform ROS  Blood pressure 124/67, pulse 74, temperature 98 F (36.7 C), temperature source Oral, resp. rate 20, height 5' 9"  (1.753 m), weight 78.3 kg (172 lb 9.9 oz), SpO2 100.00%. Physical Exam  Constitutional: He appears well-developed and well-nourished.  HENT:  Head: Normocephalic and atraumatic.  Eyes: EOM are normal.  Neck: Normal range of motion. Neck supple.  Cardiovascular: Normal rate, regular rhythm, S1 normal, S2 normal and normal heart sounds.   Respiratory: Effort normal.  GI: Soft. Normal appearance and bowel sounds are normal. There is no hepatosplenomegaly. There  is no tenderness. There is no rebound and no CVA tenderness.  Musculoskeletal:  Bandaged right ankle and foot ?foot ulcer   Assessment/Plan: 1) Anemia with guaiac positive stools: Will plan an EGD/Colonoscopy in Monday-to be done by Dr. Rozetta Nunnery. This could be an UGI  bleed BUN 41. 2) History of chronic atrial fibrillation-on Pradaxa at home-currently on hold. 3) CAD/HTN/Hyperlipidemia. 4) Malignant melanoma-right foot. 5) Foot ulcer-right leg. 6) Diabetes mellitus.  Juanita Craver 05/03/2013, 3:18 PM

## 2013-05-03 NOTE — Progress Notes (Signed)
Primary cardiologist: Dr. Dani Gobble Croitoru  Subjective:   Denies chest pain or palpitations, shortness of breath improved.   Objective:   Temp:  [97.3 F (36.3 C)-98.3 F (36.8 C)] 98 F (36.7 C) (05/02 0529) Pulse Rate:  [25-119] 74 (05/02 0529) Resp:  [18-28] 20 (05/02 0529) BP: (107-139)/(67-91) 124/67 mmHg (05/02 0529) SpO2:  [88 %-100 %] 100 % (05/02 0529) Weight:  [172 lb 9.9 oz (78.3 kg)-175 lb (79.379 kg)] 172 lb 9.9 oz (78.3 kg) (05/02 0701) Last BM Date: 05/02/13  [facility] Weights   05/02/13 2100 05/03/13 0701  Weight: 175 lb (79.379 kg) 172 lb 9.9 oz (78.3 kg)    Intake/Output Summary (Last 24 hours) at 05/03/13 1140 Last data filed at 05/03/13 0909  Gross per 24 hour  Intake 1371.25 ml  Output    851 ml  Net 520.25 ml    Telemetry: Atrial fibrillation.  Exam:  General: Chronically ill-appearing.  Lungs: Decreased breath sounds, coarse, nonlabored.  Cardiac: Irregularly irregular.  Extremities: Trace edema.   Lab Results:  Basic Metabolic Panel:  Recent Labs Lab 05/02/13 1746 05/03/13 0409  NA 140 143  K 4.4 4.5  CL 103 109  CO2 13* 15*  GLUCOSE 121* 132*  BUN 36* 41*  CREATININE 1.77* 1.82*  CALCIUM 8.2* 8.2*    Liver Function Tests:  Recent Labs Lab 05/02/13 1746  AST 19  ALT 17  ALKPHOS 56  BILITOT 1.5*  PROT 8.5*  ALBUMIN 2.3*    CBC:  Recent Labs Lab 05/02/13 1746 05/02/13 2300  WBC 7.3  --   HGB 6.3* 6.2*  HCT 20.4* 19.1*  MCV 86.8  --   PLT 291  --     Cardiac Enzymes:  Recent Labs Lab 05/02/13 2300 05/03/13 0407 05/03/13 0935  TROPONINI 0.34* 0.53* 0.74*    BNP:  Recent Labs  01/11/13 0145 05/02/13 1746  PROBNP 6537.0* 5942.0*    Coagulation:  Recent Labs Lab 05/02/13 1850  INR 1.89*    ECG: Atrial fibrillation with rapid ventricular response, left anterior fascicular block, poor R wave progression, rule out old anterior infarct pattern, nonspecific ST-T changes,  PVC.   Medications:   Scheduled Medications: . insulin aspart  0-9 Units Subcutaneous 6 times per day  . latanoprost  1 drop Both Eyes QHS  . pantoprazole (PROTONIX) IV  40 mg Intravenous Q12H  . sodium chloride  3 mL Intravenous Q12H     Infusions: . sodium chloride 75 mL/hr at 05/02/13 2115     PRN Medications:  acetaminophen, acetaminophen, alum & mag hydroxide-simeth, ondansetron (ZOFRAN) IV, ondansetron, oxyCODONE   Assessment:   1. Mild elevation of troponin I, probable type II NSTEMI in the setting of severe anemia and rapid atrial fibrillation.  2. Severe anemia, initial hemoglobin 6.3, GI bleed suspected. Pradaxa has been discontinued and GI consult pending with packed red cell transfusions.  3. Acute on chronic diastolic heart failure in the setting of above comorbidities. LVEF 55-60% as of October 2014, followup study pending.  4. Malignant melanoma of right foot status post recent excision.   Plan/Discussion:    Reviewed chart. Pradaxa has been held in the setting of severe anemia and suspected GI bleed. He is also not on Cozaar at this time, blood pressure in normal range. Currently on no medications for rate control. Heart rate may come down with improvement of anemia. Would continue to follow for now. Echocardiogram pending. At this point no plan for additional ischemic workup unless the  situation changes.   Satira Sark, M.D., F.A.C.C.

## 2013-05-03 NOTE — Progress Notes (Signed)
Nutrition Brief Note  Patient identified on the Malnutrition Screening Tool (MST) Report  Wt Readings from Last 15 Encounters:  05/03/13 172 lb 9.9 oz (78.3 kg)  01/12/13 172 lb 6.4 oz (78.2 kg)  12/05/12 171 lb 11.2 oz (77.883 kg)  12/05/12 171 lb 11.2 oz (77.883 kg)  11/19/12 182 lb (82.555 kg)  10/23/12 183 lb 3.2 oz (83.1 kg)  10/23/12 183 lb 3.2 oz (83.1 kg)  10/14/12 176 lb 9.6 oz (80.105 kg)  11/19/12 182 lb (82.555 kg)  09/30/12 178 lb (80.74 kg)  09/25/12 177 lb 6 oz (80.457 kg)  08/19/12 176 lb (79.833 kg)  01/24/12 179 lb 15.7 oz (81.64 kg)  01/24/12 179 lb 15.7 oz (81.64 kg)  01/17/12 180 lb (81.647 kg)    Body mass index is 25.48 kg/(m^2). Patient meets criteria for overweight based on current BMI.   Current diet order is clear liquids, patient is consuming approximately 100% of meals at this time. Labs and medications reviewed.   Pt admitted with acute blood loss anemia r/t dark stools.  GI to see.  Currently on clear liquids with good appetite. Weight has recently been stable; typically ranges from 175-180; now at 172 lbs. Pt states he has experienced change in functional status since having a injury to his foot.  He is now using a wheelchair at home and lives with brother who is also in a wheelchair.  States he is taking care of his brother at home. Pt states they have "people" around for support with food.  Also go to restaurants.  RD is concerned for care at home, however pt with recent wt, reporting adequate intake, and currently eating well. No nutrition interventions warranted at this time. Will monitor for diet advancement. If nutrition issues arise, please consult RD.   Brynda Greathouse, MS RD LDN Clinical Inpatient Dietitian Pager: [phone] Weekend/After hours pager: [phone]

## 2013-05-03 NOTE — H&P (Signed)
Note: This document was prepared with digital dictation and possible smart phrase technology. Any transcriptional errors that result from this process are unintentional.   Craig Martin KXF:[id number] DOB: 12-03-1926 DOA: 05/02/2013 PCP: [name], MD  Brief narrative:  80 y/o ?, Known H/o CAD s/p Circ stent 2004, Non-insulin dependant ty 2 DM, Htn, Hld, Afib CHad2Vasc2 score=7 on pradaxa, critical R LE edema with Melanoma [follwed by Plastic surgery], h/p TURP recent admission CHF exacerbation 01/11/13, admitted with Dark and tarry stools over a two-day period of time. At baseline he is very poor health literacy and does not seem to understand some of his medical conditions including atrial fibrillation and his need for Pradaxa and other medications  Past medical history-As per Problem list Chart reviewed as below- None  Consultants:  Cardiology  Procedures:  None  Antibiotics:  None   Subjective  Alert pleasant tolerating diet. Does not hurt her that any further dark stools Denies chest pain Main complaint currently is overall weakness which is improved since transfusion No nausea vomiting  Objective    Interim History: None  Telemetry: none   Objective: Filed Vitals:   05/03/13 0305 05/03/13 0450 05/03/13 0529 05/03/13 0701  BP: 107/77 113/68 124/67   Pulse: 112 97 74   Temp: 98 F (36.7 C) 98.3 F (36.8 C) 98 F (36.7 C)   TempSrc: Oral Oral Oral   Resp: 18 20 20    Height:      Weight:    78.3 kg (172 lb 9.9 oz)  SpO2: 100% 100% 100%     Intake/Output Summary (Last 24 hours) at 05/03/13 6967 Last data filed at 05/03/13 0700  Gross per 24 hour  Intake     25 ml  Output    300 ml  Net   -275 ml    Exam:  General: Alert pleasant oriented no apparent distress Cardiovascular: S1-S2 no murmur rub or gallop Respiratory: Clinically clear no added sound Abdomen: Soft nontender nondistended no rebound lower quadrant scar Skin intact Neuro  grossly intact, moving all 4 limbs equally  Data Reviewed: Basic Metabolic Panel:  Recent Labs Lab 05/02/13 1746 05/03/13 0409  NA 140 143  K 4.4 4.5  CL 103 109  CO2 13* 15*  GLUCOSE 121* 132*  BUN 36* 41*  CREATININE 1.77* 1.82*  CALCIUM 8.2* 8.2*   Liver Function Tests:  Recent Labs Lab 05/02/13 1746  AST 19  ALT 17  ALKPHOS 56  BILITOT 1.5*  PROT 8.5*  ALBUMIN 2.3*   No results found for this basename: LIPASE, AMYLASE,  in the last 168 hours No results found for this basename: AMMONIA,  in the last 168 hours CBC:  Recent Labs Lab 05/02/13 1746 05/02/13 2300  WBC 7.3  --   NEUTROABS 5.7  --   HGB 6.3* 6.2*  HCT 20.4* 19.1*  MCV 86.8  --   PLT 291  --    Cardiac Enzymes:  Recent Labs Lab 05/02/13 2300 05/03/13 0407  TROPONINI 0.34* 0.53*   BNP: No components found with this basename: POCBNP,  CBG:  Recent Labs Lab 04/28/13 1119  GLUCAP 180*    No results found for this or any previous visit (from the past 240 hour(s)).   Studies:              All Imaging reviewed and is as per above notation   Scheduled Meds: . latanoprost  1 drop Both Eyes QHS  . pantoprazole (PROTONIX) IV  40 mg  Intravenous Q12H  . sodium chloride  3 mL Intravenous Q12H   Continuous Infusions: . sodium chloride 75 mL/hr at 05/02/13 2115     Assessment/Plan: 1. Acute GI bleed-potentially lower source. Cannot tell me if he's ever had a colonoscopy, however note that he's been seen by D.r Collene Mares in the past. Gastroenterology has been consulted for further recommendations and it seems like this may be more of a lower source as he does not have an elevation of BUN/creatinine.  Continue Protonix 40 twice a day IV for suspected upper source although unlikely-he might need both endoscopy and colonoscopy 2. Anemia of acute blood loss-has been transfused 2 units blood 05/02/48. Recheck hematocrit hemoglobin every 8 hours.  Nursing to notify  If he has dark stools 3. CAD-aspirin  obviously on hold for now. Given his borderline hypotensive as well hold other medications as well 4.  hypertension with borderline hypotension-hold off on Cozaar 25 daily for now 5. Non Insulin-dependent diabetes mellitus on oral agents. Keep on clear liquids and check every 4 hourly blood sugars. Oral agent on hold 6. Afib, Chad2Vasc=7-Needs AC, however timing of the same needs to be determined by GI studies as well as when to re-start meds 7. Significant right lower extremity ischemia-has had ABIs performed 05/21/12 and has occluded a suture the proximal portion reconstituted-followed by general surgery/plastic surgery. Wound Care nurse has been consulted for further management 8. Right lower extremity melanoma-will need close followup with oncologist  Code Status: Full Family Communication: None at bedside Disposition Plan: Therapy to evaluate him for potential feasibility of discharge home. Patient lives with his brother in both get around in a wheelchair making this not the safest plan   Verneita Griffes, MD  [facility] Pager [phone] 05/03/2013, 8:08 AM    LOS: 1 day

## 2013-05-03 NOTE — Progress Notes (Signed)
[facility]  2D Echocardiogram completed.  Tool, [place] 05/03/2013 2:50 PM

## 2013-05-04 DIAGNOSIS — I5021 Acute systolic (congestive) heart failure: Secondary | ICD-10-CM

## 2013-05-04 LAB — COMPREHENSIVE METABOLIC PANEL
ALBUMIN: 1.9 g/dL — AB (ref 3.5–5.2)
ALT: 48 U/L (ref 0–53)
AST: 53 U/L — ABNORMAL HIGH (ref 0–37)
Alkaline Phosphatase: 46 U/L (ref 39–117)
BILIRUBIN TOTAL: 1.4 mg/dL — AB (ref 0.3–1.2)
BUN: 41 mg/dL — AB (ref 6–23)
CHLORIDE: 108 meq/L (ref 96–112)
CO2: 18 mEq/L — ABNORMAL LOW (ref 19–32)
CREATININE: 2 mg/dL — AB (ref 0.50–1.35)
Calcium: 7.5 mg/dL — ABNORMAL LOW (ref 8.4–10.5)
GFR calc Af Amer: 34 mL/min — ABNORMAL LOW (ref >90)
GFR, EST NON AFRICAN AMERICAN: 29 mL/min — AB (ref >90)
GLUCOSE: 82 mg/dL (ref 70–99)
Potassium: 3.8 mEq/L (ref 3.7–5.3)
Sodium: 142 mEq/L (ref 137–147)
Total Protein: 7 g/dL (ref 6.0–8.3)

## 2013-05-04 LAB — GLUCOSE, CAPILLARY
GLUCOSE-CAPILLARY: 122 mg/dL — AB (ref 70–99)
Glucose-Capillary: 171 mg/dL — ABNORMAL HIGH (ref 70–99)
Glucose-Capillary: 68 mg/dL — ABNORMAL LOW (ref 70–99)
Glucose-Capillary: 80 mg/dL (ref 70–99)
Glucose-Capillary: 86 mg/dL (ref 70–99)
Glucose-Capillary: 89 mg/dL (ref 70–99)
Glucose-Capillary: 91 mg/dL (ref 70–99)
Glucose-Capillary: 99 mg/dL (ref 70–99)

## 2013-05-04 LAB — HEMOGLOBIN AND HEMATOCRIT, BLOOD
HCT: 23.8 % — ABNORMAL LOW (ref 39.0–52.0)
HCT: 24.9 % — ABNORMAL LOW (ref 39.0–52.0)
Hemoglobin: 7.5 g/dL — ABNORMAL LOW (ref 13.0–17.0)
Hemoglobin: 8.1 g/dL — ABNORMAL LOW (ref 13.0–17.0)

## 2013-05-04 LAB — PREPARE RBC (CROSSMATCH)

## 2013-05-04 MED ORDER — FUROSEMIDE 10 MG/ML IJ SOLN
20.0000 mg | Freq: Once | INTRAMUSCULAR | Status: AC
  Administered 2013-05-04: 20 mg via INTRAVENOUS

## 2013-05-04 MED ORDER — FUROSEMIDE 40 MG PO TABS
40.0000 mg | ORAL_TABLET | Freq: Once | ORAL | Status: AC
  Administered 2013-05-04: 40 mg via ORAL
  Filled 2013-05-04: qty 1

## 2013-05-04 MED ORDER — CARVEDILOL 3.125 MG PO TABS
3.1250 mg | ORAL_TABLET | Freq: Two times a day (BID) | ORAL | Status: DC
  Administered 2013-05-04 – 2013-05-06 (×6): 3.125 mg via ORAL
  Filled 2013-05-04 (×7): qty 1

## 2013-05-04 MED ORDER — DIPHENHYDRAMINE HCL 25 MG PO CAPS
25.0000 mg | ORAL_CAPSULE | Freq: Once | ORAL | Status: AC
  Administered 2013-05-04: 25 mg via ORAL
  Filled 2013-05-04: qty 1

## 2013-05-04 MED ORDER — ACETAMINOPHEN 325 MG PO TABS
650.0000 mg | ORAL_TABLET | Freq: Once | ORAL | Status: AC
  Administered 2013-05-04: 650 mg via ORAL
  Filled 2013-05-04: qty 2

## 2013-05-04 NOTE — H&P (Signed)
Note: This document was prepared with digital dictation and possible smart phrase technology. Any transcriptional errors that result from this process are unintentional.   Craig Martin KGU:[id number] DOB: 1926-08-09 DOA: 05/02/2013 PCP: [name], MD  Brief narrative:  80 y/o ?, Known H/o CAD s/p Circ stent 2004, Non-insulin dependant ty 2 DM, Htn, Hld, Afib CHad2Vasc2 score=7 on pradaxa, critical R LE edema with Melanoma [follwed by Plastic surgery], h/p TURP recent admission CHF exacerbation 01/11/13, admitted with Dark and tarry stools over a two-day period of time. At baseline he is very poor health literacy and does not seem to understand some of his medical conditions including atrial fibrillation and his need for Pradaxa and other medications  Past medical history-As per Problem list Chart reviewed as below- None  Consultants:  Cardiology  Procedures: ECHO 5/2 Study Conclusions  - Left ventricle: The cavity size was moderately dilated. There was mild focal basal hypertrophy of the septum. Systolic function was severely reduced. The estimated ejection fraction was in the range of 25% to 30%. Diffuse hypokinesis. There is akinesis of the basalinferior myocardium. - Aortic valve: Moderate thickening and calcification, consistent with sclerosis. - Mitral valve: Mild to moderate regurgitation. - Left atrium: The atrium was mildly dilated. - Right atrium: The atrium was moderately to severely dilated. - Tricuspid valve: Moderate-severe regurgitation. - Pulmonary arteries: PA peak pressure: 50mm Hg (S). Impressions:  - The right ventricular systolic pressure was increased consistent with severe pulmonary hypertension.   Antibiotics:  None   Subjective  Alert pleasant tolerating diet.  Sleepy and also poor historian States on questioning no cp, no palpitation no n,v Nursing doesn't report any Dark stools as yet but have noted some low cbg's as patient only on  clears   Objective    Interim History: None  Telemetry: none   Objective: Filed Vitals:   05/03/13 2050 05/03/13 2100 05/04/13 0637 05/04/13 0823  BP: 105/64 101/67 133/55   Pulse: 95 88 79   Temp: 98.6 F (37 C) 98 F (36.7 C) 97.6 F (36.4 C)   TempSrc: Oral Oral Oral   Resp: 20 20 20    Height:      Weight:   85.5 kg (188 lb 7.9 oz) 83.3 kg (183 lb 10.3 oz)  SpO2: 100% 100% 100%     Intake/Output Summary (Last 24 hours) at 05/04/13 0905 Last data filed at 05/04/13 0020  Gross per 24 hour  Intake 2408.5 ml  Output    502 ml  Net 1906.5 ml    Exam:  General: Alert pleasant oriented no apparent distress Cardiovascular: S1-S2 no murmur rub or gallop, JVD 1/2 way up neck Respiratory: Clinically clear no added sound Abdomen: Soft nontender nondistended no rebound lower quadrant scar Skin intact Neuro grossly intact, moving all 4 limbs equally  Data Reviewed: Basic Metabolic Panel:  Recent Labs Lab 05/02/13 1746 05/03/13 0409  NA 140 143  K 4.4 4.5  CL 103 109  CO2 13* 15*  GLUCOSE 121* 132*  BUN 36* 41*  CREATININE 1.77* 1.82*  CALCIUM 8.2* 8.2*   Liver Function Tests:  Recent Labs Lab 05/02/13 1746  AST 19  ALT 17  ALKPHOS 56  BILITOT 1.5*  PROT 8.5*  ALBUMIN 2.3*   No results found for this basename: LIPASE, AMYLASE,  in the last 168 hours No results found for this basename: AMMONIA,  in the last 168 hours CBC:  Recent Labs Lab 05/02/13 1746 05/02/13 2300 05/03/13 1400 05/03/13 2341 05/04/13 0745  WBC 7.3  --   --   --   --   NEUTROABS 5.7  --   --   --   --   HGB 6.3* 6.2* 7.8* 8.1* 7.5*  HCT 20.4* 19.1* 24.2* 24.9* 23.8*  MCV 86.8  --   --   --   --   PLT 291  --   --   --   --    Cardiac Enzymes:  Recent Labs Lab 05/02/13 2300 05/03/13 0407 05/03/13 0935  TROPONINI 0.34* 0.53* 0.74*   BNP: No components found with this basename: POCBNP,  CBG:  Recent Labs Lab 05/04/13 0016 05/04/13 0200 05/04/13 0458  05/04/13 0631 05/04/13 0753  GLUCAP 68* 89 91 86 80    No results found for this or any previous visit (from the past 240 hour(s)).   Studies:              All Imaging reviewed and is as per above notation   Scheduled Meds: . carvedilol  3.125 mg Oral BID WC  . insulin aspart  0-9 Units Subcutaneous 6 times per day  . latanoprost  1 drop Both Eyes QHS  . pantoprazole (PROTONIX) IV  40 mg Intravenous Q12H  . sodium chloride  3 mL Intravenous Q12H   Continuous Infusions: . sodium chloride 75 mL/hr at 05/02/13 2115     Assessment/Plan:  1. Acute GI bleed leading to hypovolemic shock- Now stabilized.  Gastroenterology has been consulted for further recommendations, patient not on the gutters, ibuprofen or any other medication that might precipitate-potential scope am 5/3.  Continue Protonix 40 twice a day IV for suspected upper source-he might need both endoscopy and colonoscopy.  No further stools reported.  IV fluids D/c. 2. Anemia of acute blood loss-has been transfused 2 units blood 05/02/13. Recheck hematocrit hemoglobin in am.  Will transfuse 1 more unit 05/04/13 3. CAD-aspirin obviously on hold for now. Cardiology has started low dose coreg as mild sinus tach with PC's on 05/04/13 am 4. Mildly decompensated Acute Systolic heart rapidly/CHF-Echo shows EF of 25-30% bedside JVD is elevated. One-time dose Lasix 40 mg in addition to Lasix after transfusion. We'll reevaluate in the morning-may need ischemic workup as an outpatient. 5. Hypertension with borderline hypotension-hold off on Cozaar 25 daily for now-see above note 6. Non Insulin-dependent diabetes mellitus on oral agents. Cover with SSI only if CBG above 200.  Nursing made aware 7. Afib, Chad2Vasc=7-Needs AC, however timing of the same needs to be determined by GI studies as well as when to re-start meds 8. Significant right lower extremity ischemia-has had ABIs performed 05/21/12 with poor blood flow-followed by general  surgery/plastic surgery. Wound Care nurse has been consulted for further management as has a wound VAC 9. Right lower extremity melanoma-will need close followup with oncologist  Code Status: Full Family Communication: None at bedside, called ? # fr HCPOA-Countess,[name] Brother [phone] Disposition Plan: Therapy to evaluate him for potential feasibility of discharge home. Patient lives with his brother in both get around in a wheelchair making this not the safest plan   Verneita Griffes, MD  [facility] Pager [phone] 05/04/2013, 9:05 AM    LOS: 2 days

## 2013-05-04 NOTE — Progress Notes (Signed)
Primary cardiologist: Dr. Dani Gobble Croitoru  Subjective:   No breathlessness at rest. No chest pain or palpitations.   Objective:   Temp:  [97.3 F (36.3 C)-98.6 F (37 C)] 97.6 F (36.4 C) (05/03 0637) Pulse Rate:  [77-104] 79 (05/03 0637) Resp:  [18-20] 20 (05/03 0637) BP: (100-133)/(52-77) 133/55 mmHg (05/03 0637) SpO2:  [100 %] 100 % (05/03 0637) Weight:  [188 lb 7.9 oz (85.5 kg)] 188 lb 7.9 oz (85.5 kg) (05/03 0637) Last BM Date: 05/03/13  Filed Weights   05/02/13 2100 05/03/13 0701 05/04/13 2952  Weight: 175 lb (79.379 kg) 172 lb 9.9 oz (78.3 kg) 188 lb 7.9 oz (85.5 kg)    Intake/Output Summary (Last 24 hours) at 05/04/13 0806 Last data filed at 05/04/13 0020  Gross per 24 hour  Intake 2408.5 ml  Output    502 ml  Net 1906.5 ml    Telemetry: Atrial fibrillation.  Exam:  General: Chronically ill-appearing.  Lungs: Decreased breath sounds, coarse, nonlabored.  Cardiac: Irregularly irregular.  Extremities: Trace edema.   Lab Results:  Basic Metabolic Panel:  Recent Labs Lab 05/02/13 1746 05/03/13 0409  NA 140 143  K 4.4 4.5  CL 103 109  CO2 13* 15*  GLUCOSE 121* 132*  BUN 36* 41*  CREATININE 1.77* 1.82*  CALCIUM 8.2* 8.2*    Liver Function Tests:  Recent Labs Lab 05/02/13 1746  AST 19  ALT 17  ALKPHOS 56  BILITOT 1.5*  PROT 8.5*  ALBUMIN 2.3*    CBC:  Recent Labs Lab 05/02/13 1746 05/02/13 2300 05/03/13 1400 05/03/13 2341  WBC 7.3  --   --   --   HGB 6.3* 6.2* 7.8* 8.1*  HCT 20.4* 19.1* 24.2* 24.9*  MCV 86.8  --   --   --   PLT 291  --   --   --     Cardiac Enzymes:  Recent Labs Lab 05/02/13 2300 05/03/13 0407 05/03/13 0935  TROPONINI 0.34* 0.53* 0.74*    BNP:  Recent Labs  01/11/13 0145 05/02/13 1746  PROBNP 6537.0* 5942.0*    Coagulation:  Recent Labs Lab 05/02/13 1850  INR 1.89*    ECG: Atrial fibrillation with rapid ventricular response, left anterior fascicular block, poor R wave  progression, rule out old anterior infarct pattern, nonspecific ST-T changes, PVC.  Echocardiogram (05/03/13): Study Conclusions  - Left ventricle: The cavity size was moderately dilated. There was mild focal basal hypertrophy of the septum. Systolic function was severely reduced. The estimated ejection fraction was in the range of 25% to 30%. Diffuse hypokinesis. There is akinesis of the basalinferior myocardium. - Aortic valve: Moderate thickening and calcification, consistent with sclerosis. - Mitral valve: Mild to moderate regurgitation. - Left atrium: The atrium was mildly dilated. - Right atrium: The atrium was moderately to severely dilated. - Tricuspid valve: Moderate-severe regurgitation. - Pulmonary arteries: PA peak pressure: 31mm Hg (S). Impressions:  - The right ventricular systolic pressure was increased consistent with severe pulmonary hypertension.   Medications:   Scheduled Medications: . insulin aspart  0-9 Units Subcutaneous 6 times per day  . latanoprost  1 drop Both Eyes QHS  . pantoprazole (PROTONIX) IV  40 mg Intravenous Q12H  . sodium chloride  3 mL Intravenous Q12H    Infusions: . sodium chloride 75 mL/hr at 05/02/13 2115    PRN Medications: acetaminophen, acetaminophen, alum & mag hydroxide-simeth, ondansetron (ZOFRAN) IV, ondansetron, oxyCODONE   Assessment:   1. Mild elevation of troponin I,  probable type II NSTEMI in the setting of severe anemia and rapid atrial fibrillation.  2. Severe anemia, initial hemoglobin 6.3, GI bleed suspected. Pradaxa has been discontinued and GI consult pending with packed red cell transfusions. Hemoglobin up to 8.1.  3. Acute systolic heart failure in the setting of above comorbidities. LVEF down to range of 25-30% by recent followup echocardiogram. Also severe pulmonary hypertension. He has known history of CAD, moderate multivessel status post BMS to circumflex in 2004. Has likely had progression.  4.  Malignant melanoma of right foot status post recent excision.   Plan/Discussion:    Pradaxa has been held in the setting of severe anemia and suspected GI bleed. He is also not on Cozaar at this time, blood pressure in normal range. Will begin low-dose Coreg 3.125 mg twice daily for now. GI workup pending - plan for EGD/colonoscopy potentially on Monday. He should be able to proceed with this from a cardiac perspective. Depending on results, can determine further candidacy for long-term anticoagulation, and also continue to make adjustments in cardiac regimen and management of cardiomyopathy. Will ultimately need followup ischemic workup.    Satira Sark, M.D., F.A.C.C.

## 2013-05-04 NOTE — Evaluation (Signed)
Physical Therapy Evaluation Patient Details Name: Craig Martin MRN: [id number] DOB: [birth year] Today's Date: 05/04/2013   History of Present Illness  Dark and tarry stools over a two-day period of time; Pt with decr Hgb, 7.5 the am of 5/3; has recieved blood transfusion  Clinical Impression  Pt admitted with above. Pt currently with functional limitations due to the deficits listed below (see PT Problem List).  Pt will benefit from skilled PT to increase their independence and safety with mobility to allow discharge to the venue listed below.   It's worth noting that pt is an inconsistent historian, and I'd like a clearer picture of how he and his brother function at home. DC to SNF for postacute rehab is a more favorable option to maximize independence and safety with mobility prior to dc home, but still that does not address all concerns;   Will request SW consult for consideration of SNF and for community resources -- does the pt and his brother qualify for Meals on Wheels?     Follow Up Recommendations SNF(states he would rather not go to his previous SNF)    Equipment Recommendations  Wheelchair cushion (measurements PT)    Recommendations for Other Services OT consult     Precautions / Restrictions Precautions Precautions: Fall Precaution Comments: VAC to R foot; PRAFO boot in room Required Braces or Orthoses: Other Brace/Splint Other Brace/Splint: PRAFO boot (has walking sole) for getting up      Mobility  Bed Mobility Overal bed mobility: Needs Assistance Bed Mobility: Supine to Sit     Supine to sit: Min assist     General bed mobility comments: Handheld assist to pull up and come to sit  Transfers Overall transfer level: Needs assistance Equipment used: None Transfers: Lateral/Scoot Transfers          Lateral/Scoot Transfers: Min assist General transfer comment: Performed lateral scoot transfer bed to recliner with drop arm down; Overall, pt moved well,  and needed guard assist for safety and min assist for optimal positioning in chair; Pt demonstrated for this therapist how he crosses R LE over LLE to keep from putting weight on his R foot during transfers  Ambulation/Gait                Stairs            Wheelchair Mobility    Modified Rankin (Stroke Patients Only)       Balance Overall balance assessment: Needs assistance Sitting-balance support: Single extremity supported;Bilateral upper extremity supported Sitting balance-Leahy Scale: Fair                                       Pertinent Vitals/Pain no apparent distress     Home Living Family/patient expects to be discharged to:: Private residence Living Arrangements: Other relatives (Brother) Available Help at Discharge: Family;Other (Comment) (brother is w/c bound; a freind checks on them regularly) Type of Home: House Home Access: Ramped entrance     Home Layout: One level Home Equipment: None;Wheelchair - [employer] - 2 wheels      Prior Function Level of Independence: Independent with assistive device(s)         Comments: Independent with simple mobility and transfers, but when asked about grocery shopping, pt answered he and his brother don't worry about grocery shopping and don't eat much; apparently a friend brings them fast food (?) daily; Poor historian and at  times difficult to understand     Hand Dominance        Extremity/Trunk Assessment   Upper Extremity Assessment: Defer to OT evaluation           Lower Extremity Assessment: Generalized weakness (has not stood since last admission (January))         Communication   Communication: No difficulties;Expressive difficulties (at times hard to understand)  Cognition Arousal/Alertness: Awake/alert Behavior During Therapy: WFL for tasks assessed/performed Overall Cognitive Status: Within Functional Limits for tasks assessed                       General Comments General comments (skin integrity, edema, etc.): VAC to R foot    Exercises        Assessment/Plan    PT Assessment Patient needs continued PT services  PT Diagnosis Generalized weakness   PT Problem List Decreased strength;Decreased activity tolerance;Decreased balance;Decreased mobility;Decreased coordination;Decreased skin integrity  PT Treatment Interventions DME instruction;Functional mobility training;Therapeutic activities;Therapeutic exercise;Balance training;Patient/family education;Wheelchair mobility training   PT Goals (Current goals can be found in the Care Plan section) Acute Rehab PT Goals Patient Stated Goal: Did not state PT Goal Formulation: With patient Time For Goal Achievement: 05/18/13 Potential to Achieve Goals: Good Additional Goals Additional Goal #1: Pt will demonstrate use of wheelchair including propulsion, and parts management independently    Frequency Min 3X/week   Barriers to discharge Decreased caregiver support Pt must be modified independent at least at wheelchair level; This therapist has concern that his basic needs might not be being met    Co-evaluation               End of Session Equipment Utilized During Treatment: Gait belt Activity Tolerance: Patient tolerated treatment well Patient left: in chair;with call bell/phone within reach Nurse Communication: Mobility status         Time: 6073-7106 PT Time Calculation (min): 25 min   Charges:   PT Evaluation $Initial PT Evaluation Tier I: 1 Procedure PT Treatments $Therapeutic Activity: 8-22 mins   PT G Codes:          [facility] [place] 05/04/2013, 7:51 PM Roney Marion, [place] Pager [phone] Office [phone]

## 2013-05-04 NOTE — Progress Notes (Signed)
Cross cover [facility] Subjective: Since I last evaluated the patient, he seems to be about the same. Unable to voice any complaints. Claims he does not know how he feels. He is receiving a blood transfusion at the present time.  Objective: Vital signs in last 24 hours: Temp:  [97.1 F (36.2 C)-98.6 F (37 C)] 97.1 F (36.2 C) (05/03 1017) Pulse Rate:  [53-104] 53 (05/03 1017) Resp:  [18-20] 20 (05/03 1017) BP: (100-133)/(52-76) 108/76 mmHg (05/03 1017) SpO2:  [100 %] 100 % (05/03 1017) Weight:  [83.3 kg (183 lb 10.3 oz)-85.5 kg (188 lb 7.9 oz)] 83.3 kg (183 lb 10.3 oz) (05/03 0823) Last BM Date: 05/03/13  Intake/Output from previous day: 05/02 0701 - 05/03 0700 In: 3214.8 [P.O.:1196; I.V.:2006.3; Blood:12.5] Out: 502 [Urine:500; Stool:2] Intake/Output this shift: Total I/O In: 360 [P.O.:360] Out: -   General appearance: cooperative, appears stated age, fatigued, no distress and slowed mentation Resp: clear to auscultation bilaterally Cardio: regular rate and rhythm, S1, S2 normal, no murmur, click, rub or gallop GI: soft, obese, non-tender; bowel sounds normal; no masses,  no organomegaly  Lab Results:  Recent Labs  05/02/13 1746  05/03/13 1400 05/03/13 2341 05/04/13 0745  WBC 7.3  --   --   --   --   HGB 6.3*  < > 7.8* 8.1* 7.5*  HCT 20.4*  < > 24.2* 24.9* 23.8*  PLT 291  --   --   --   --   < > = values in this interval not displayed. BMET  Recent Labs  05/02/13 1746 05/03/13 0409 05/04/13 0745  NA 140 143 142  K 4.4 4.5 3.8  CL 103 109 108  CO2 13* 15* 18*  GLUCOSE 121* 132* 82  BUN 36* 41* 41*  CREATININE 1.77* 1.82* 2.00*  CALCIUM 8.2* 8.2* 7.5*   LFT  Recent Labs  05/04/13 0745  PROT 7.0  ALBUMIN 1.9*  AST 53*  ALT 48  ALKPHOS 46  BILITOT 1.4*   PT/INR  Recent Labs  05/02/13 1850  LABPROT 21.1*  INR 1.89*   Studies/Results: Dg Chest 2 View  05/02/2013   CLINICAL DATA:  Worsening shortness of breath.  Ex-smoker.  EXAM: CHEST  2 VIEW   COMPARISON:  01/11/2013.  FINDINGS: Stable enlarged cardiac silhouette. Small amount of airspace opacity at the posterior lung bases on the lateral view, most likely on left, medially on the frontal view. The remainder of the lungs are clear with normal vascularity. Mild thoracic spine degenerative changes. Mild central peribronchial thickening.  IMPRESSION: 1. Mild left lower lobe pneumonia or patchy atelectasis. 2. Mild bronchitic changes. 3. Cardiomegaly.   Electronically Signed   By: Enrique Sack M.D.   On: 05/02/2013 18:50   Medications: I have reviewed the patient's current medications.  Assessment/Plan: 1) Anemia and guaiac positive stools: Will schedule him for an EGD tomorrow as he does not seem to understand the importance of a prep; maybe we can find a source of blood loss on the EGD. Continue PPI's for now.  2) Severe malnutrition. He may benefit from a nutrition consult. Check prealbumin levels.  3) Chronic atrial fibrillation was on Pradaxa INR 1.89 today. 4) CAD/HTN/ Hyperlipidemia. 5) Malignant melanoma of the right foot.  6) Renal insufficiency.  LOS: 2 days   Juanita Craver 05/04/2013, 10:21 AM

## 2013-05-05 ENCOUNTER — Encounter (HOSPITAL_COMMUNITY): Payer: Self-pay | Admitting: *Deleted

## 2013-05-05 ENCOUNTER — Inpatient Hospital Stay (HOSPITAL_COMMUNITY): Payer: Medicare Other | Admitting: Anesthesiology

## 2013-05-05 ENCOUNTER — Encounter (HOSPITAL_COMMUNITY)
Admission: EM | Disposition: A | Discharge status: Skilled Nursing Facility | Payer: Self-pay | Source: Home / Self Care | Attending: Family Medicine

## 2013-05-05 ENCOUNTER — Encounter (HOSPITAL_COMMUNITY): Payer: Medicare Other | Admitting: Anesthesiology

## 2013-05-05 DIAGNOSIS — R195 Other fecal abnormalities: Secondary | ICD-10-CM

## 2013-05-05 DIAGNOSIS — K31819 Angiodysplasia of stomach and duodenum without bleeding: Secondary | ICD-10-CM

## 2013-05-05 DIAGNOSIS — D649 Anemia, unspecified: Secondary | ICD-10-CM

## 2013-05-05 DIAGNOSIS — K259 Gastric ulcer, unspecified as acute or chronic, without hemorrhage or perforation: Secondary | ICD-10-CM

## 2013-05-05 HISTORY — PX: ESOPHAGOGASTRODUODENOSCOPY: SHX5428

## 2013-05-05 LAB — TYPE AND SCREEN
ABO/RH(D): O POS
Antibody Screen: NEGATIVE
UNIT DIVISION: 0
Unit division: 0
Unit division: 0
Unit division: 0

## 2013-05-05 LAB — COMPREHENSIVE METABOLIC PANEL
ALK PHOS: 44 U/L (ref 39–117)
ALT: 41 U/L (ref 0–53)
AST: 36 U/L (ref 0–37)
Albumin: 1.8 g/dL — ABNORMAL LOW (ref 3.5–5.2)
BUN: 35 mg/dL — ABNORMAL HIGH (ref 6–23)
CHLORIDE: 105 meq/L (ref 96–112)
CO2: 21 meq/L (ref 19–32)
Calcium: 7.5 mg/dL — ABNORMAL LOW (ref 8.4–10.5)
Creatinine, Ser: 1.85 mg/dL — ABNORMAL HIGH (ref 0.50–1.35)
GFR, EST AFRICAN AMERICAN: 37 mL/min — AB (ref >90)
GFR, EST NON AFRICAN AMERICAN: 32 mL/min — AB (ref >90)
Glucose, Bld: 85 mg/dL (ref 70–99)
Potassium: 3.4 mEq/L — ABNORMAL LOW (ref 3.7–5.3)
SODIUM: 138 meq/L (ref 137–147)
Total Bilirubin: 1.4 mg/dL — ABNORMAL HIGH (ref 0.3–1.2)
Total Protein: 7 g/dL (ref 6.0–8.3)

## 2013-05-05 LAB — GLUCOSE, CAPILLARY
GLUCOSE-CAPILLARY: 123 mg/dL — AB (ref 70–99)
GLUCOSE-CAPILLARY: 146 mg/dL — AB (ref 70–99)
GLUCOSE-CAPILLARY: 166 mg/dL — AB (ref 70–99)
GLUCOSE-CAPILLARY: 54 mg/dL — AB (ref 70–99)
GLUCOSE-CAPILLARY: 75 mg/dL (ref 70–99)
GLUCOSE-CAPILLARY: 81 mg/dL (ref 70–99)
Glucose-Capillary: 70 mg/dL (ref 70–99)
Glucose-Capillary: 115 mg/dL — ABNORMAL HIGH (ref 70–99)
Glucose-Capillary: 64 mg/dL — ABNORMAL LOW (ref 70–99)

## 2013-05-05 LAB — CBC
HCT: 25.6 % — ABNORMAL LOW (ref 39.0–52.0)
HEMOGLOBIN: 8.3 g/dL — AB (ref 13.0–17.0)
MCH: 27.8 pg (ref 26.0–34.0)
MCHC: 32.4 g/dL (ref 30.0–36.0)
MCV: 85.6 fL (ref 78.0–100.0)
Platelets: 194 10*3/uL (ref 150–400)
RBC: 2.99 MIL/uL — AB (ref 4.22–5.81)
RDW: 17.7 % — ABNORMAL HIGH (ref 11.5–15.5)
WBC: 4.9 10*3/uL (ref 4.0–10.5)

## 2013-05-05 LAB — OCCULT BLOOD, POC DEVICE: Fecal Occult Bld: POSITIVE — AB

## 2013-05-05 SURGERY — EGD (ESOPHAGOGASTRODUODENOSCOPY)
Anesthesia: Monitor Anesthesia Care

## 2013-05-05 SURGERY — EGD (ESOPHAGOGASTRODUODENOSCOPY)
Anesthesia: Moderate Sedation

## 2013-05-05 MED ORDER — PROPOFOL 10 MG/ML IV BOLUS
INTRAVENOUS | Status: DC | PRN
  Administered 2013-05-05: 20 mg via INTRAVENOUS
  Administered 2013-05-05: 10 mg via INTRAVENOUS
  Administered 2013-05-05: 20 mg via INTRAVENOUS

## 2013-05-05 MED ORDER — PROMETHAZINE HCL 25 MG/ML IJ SOLN: 6.2500 mg | INTRAMUSCULAR | Status: DC | PRN

## 2013-05-05 MED ORDER — SODIUM CHLORIDE 0.9 % IV SOLN: INTRAVENOUS | Status: DC

## 2013-05-05 MED ORDER — PHENYLEPHRINE HCL 10 MG/ML IJ SOLN
INTRAMUSCULAR | Status: DC | PRN
  Administered 2013-05-05: 40 ug via INTRAVENOUS

## 2013-05-05 MED ORDER — LACTATED RINGERS IV SOLN
INTRAVENOUS | Status: DC | PRN
  Administered 2013-05-05: 11:00:00 via INTRAVENOUS

## 2013-05-05 MED ORDER — LACTATED RINGERS IV SOLN
INTRAVENOUS | Status: DC
  Administered 2013-05-05: 10:00:00 via INTRAVENOUS

## 2013-05-05 MED ORDER — DEXTROSE 50 % IV SOLN: 25.0000 mL | Freq: Once | INTRAVENOUS | Status: AC | PRN

## 2013-05-05 MED ORDER — DEXTROSE 50 % IV SOLN
INTRAVENOUS | Status: AC
  Filled 2013-05-05: qty 50

## 2013-05-05 MED ORDER — ALBUMIN HUMAN 5 % IV SOLN
12.5000 g | Freq: Once | INTRAVENOUS | Status: AC
  Administered 2013-05-05: 12.5 g via INTRAVENOUS
  Filled 2013-05-05: qty 250

## 2013-05-05 MED ORDER — DEXTROSE 50 % IV SOLN
INTRAVENOUS | Status: AC
  Administered 2013-05-05: 50 mL
  Filled 2013-05-05: qty 50

## 2013-05-05 MED ORDER — PANTOPRAZOLE SODIUM 40 MG PO TBEC
40.0000 mg | DELAYED_RELEASE_TABLET | Freq: Two times a day (BID) | ORAL | Status: DC
  Administered 2013-05-05 – 2013-05-06 (×3): 40 mg via ORAL
  Filled 2013-05-05 (×3): qty 1

## 2013-05-05 MED ORDER — SODIUM CHLORIDE 0.9 % IV SOLN
INTRAVENOUS | Status: DC
  Administered 2013-05-05: 05:00:00 via INTRAVENOUS

## 2013-05-05 MED ORDER — MORPHINE SULFATE 2 MG/ML IJ SOLN: 1.0000 mg | INTRAMUSCULAR | Status: DC | PRN

## 2013-05-05 NOTE — Anesthesia Postprocedure Evaluation (Signed)
Anesthesia Post Note  Patient: Craig Martin  Procedure(s) Performed: Procedure(s) (LRB): ESOPHAGOGASTRODUODENOSCOPY (EGD) (N/A)  Anesthesia type: MAC  Patient location: PACU  Post pain: Pain level controlled  Post assessment: Patient's Cardiovascular Status Stable  Last Vitals:  Filed Vitals:   05/05/13 1200  BP: 103/68  Pulse: 85  Temp:   Resp: 19    Post vital signs: Reviewed and stable  Level of consciousness: sedated  Complications: No apparent anesthesia complications

## 2013-05-05 NOTE — Op Note (Signed)
[facility] [place], [address]   ENDOSCOPY PROCEDURE REPORT  PATIENT: Craig, Martin  MR#: [id number] BIRTHDATE: 02/05/1926 , 89  yrs. old GENDER: Male ENDOSCOPIST: Jerene Bears, MD REFERRED BY:  [facility] PROCEDURE DATE:  05/05/2013 PROCEDURE:  EGD w/ biopsy and EGD w/ ablation ASA CLASS:     Class III INDICATIONS:  Anemia.   Heme positive stool.  Hx of metastatic melanoma MEDICATIONS: MAC sedation, administered by CRNA and See Anesthesia Report. TOPICAL ANESTHETIC: none  DESCRIPTION OF PROCEDURE: After the risks benefits and alternatives of the procedure were thoroughly explained, informed consent was obtained.  The Pentax Gastroscope [id number] endoscope was introduced through the mouth and advanced to the second portion of the duodenum. Without limitations.  The instrument was slowly withdrawn as the mucosa was fully examined.     ESOPHAGUS: The mucosa of the esophagus appeared normal.  STOMACH: Multiple small non-bleeding linear, round, shallow and clean-based ulcers were found in the gastric antrum and prepyloric region of the stomach.  Biopsies were taken around the ulcers and from the gastric body, incisura and antrum.   An angioectasia small, measuring 4 X 42mm in size, was found at the incisura.  APC ablation with ERBE gastric setting was performed with success. There was no bleeding.  DUODENUM: The duodenal mucosa showed no abnormalities in the bulb and second portion of the duodenum.  Retroflexed views revealed no abnormalities.     The scope was then withdrawn from the patient and the procedure completed.  COMPLICATIONS: There were no complications.  ENDOSCOPIC IMPRESSION: 1.   The mucosa of the esophagus appeared normal 2.   Multiple small non-bleeding ulcers were found in the gastric antrum and prepyloric region of the stomach; multiple biopsies 3.   Gastric angioectasia was found at the incisura;  ablated with APC 4.   The duodenal mucosa showed no abnormalities in the bulb and second portion of the duodenum  RECOMMENDATIONS: 1.  Await biopsy results 2.  Follow-up of helicobacter pylori status, treat if indicated 3.  BID PPI for 1 month, then daily thereafter 4.  Avoid NSAIDs 5.  Consider CT abd/pelvis given history of metastatic melanoma  eSigned:  Jerene Bears, MD 05/05/2013 11:49 AM   CC:The Patient  PATIENT NAME:  Craig, Martin MR#: [id number]

## 2013-05-05 NOTE — Consult Note (Signed)
WOC wound consult note Reason for Consult: VAC in place from home. Followed by Dr. Migdalia Dk in the [facility]. Surgical site from excision of melanoma.  Dr. Migdalia Dk using collagen under VAC at times.  Wound type:Surgical   Measurement:right latera foot: 8cm x 9cm x 0.5cm; right heel: 4cm x 3.5cm x 0.2cm  Wound bed: both are clean, pink, moist with 100% granulation at the heel and 95% granulation at the lateral foot, 5% yellow slough at the most distal edge of the foot wound, some remaining Endform over the wound bed.  Drainage (amount, consistency, odor) none in VAC canister  Periwound:macerated and evidence of healing at the wound edges. Dressing procedure/placement/frequency:Drape to protect the skin bridge between the heel and the lateral foot.; 3 pcs of black foam used to fill the lateral foot, heel and bridge the two wounds together, draped and sealed at 195mmHG. Pt tolerated well. Will plan for change again on Wednesday, will notify Dr. Leafy Ro staff patient is in the hospital.   [facility] RN,CWOCN [phone]

## 2013-05-05 NOTE — Progress Notes (Signed)
Patient ID: Aalijah Lanphere, male   DOB: 02-29-1926, 80 y.o.   MRN: [id number]  Wound care follow up:  Patient well known to Dr. Migdalia Dk following excision of a melanoma of the right foot.  He has a slow healing wound over the right foot and this has been responding well to Acell, endoform, collagen and the VAC dressing.  He was admitted with CHF, A-fib with RVR, UGIB and anemia. He is improving and reports that he will likely be discharged home tomorrow.  If he is discharged tomorrow, will need HH to resume for his VAC dressing changes.  If he is not DC tomorrow, I will plan to come on Wed to change the VAC.   He will need to follow up in the next couple of weeks with Dr. Migdalia Dk in the [facility] on a Monday morning [phone].   Please do not hesitate to call for questions,   RAYBURN,SHAWN,PA-C for Dr. Migdalia Dk Plastic Surgery [phone]

## 2013-05-05 NOTE — Anesthesia Preprocedure Evaluation (Addendum)
Anesthesia Evaluation  Patient identified by MRN, date of birth, ID band Patient awake    Reviewed: Allergy & Precautions, H&P , NPO status , Patient's Chart, lab work & pertinent test results  History of Anesthesia Complications Negative for: history of anesthetic complications  Airway Mallampati: II TM Distance: >3 FB Neck ROM: full    Dental  (+) Dental Advidsory Given, Poor Dentition   Pulmonary former smoker,    Pulmonary exam normal       Cardiovascular hypertension, + CAD + dysrhythmias Atrial Fibrillation Rhythm:irregular     Neuro/Psych CVA, No Residual Symptoms    GI/Hepatic Neg liver ROS,   Endo/Other  diabetes  Renal/GU negative Renal ROS     Musculoskeletal   Abdominal   Peds  Hematology   Anesthesia Other Findings   Reproductive/Obstetrics                         Anesthesia Physical Anesthesia Plan  ASA: III  Anesthesia Plan: MAC   Post-op Pain Management:    Induction: Intravenous  Airway Management Planned:   Additional Equipment:   Intra-op Plan:   Post-operative Plan:   Informed Consent:   Dental Advisory Given  Plan Discussed with: CRNA, Anesthesiologist and Surgeon  Anesthesia Plan Comments:        Anesthesia Quick Evaluation

## 2013-05-05 NOTE — Progress Notes (Signed)
Contact by Dr. Percival Spanish regarding when to resume NOAC. Patient is endoscopy today which revealed small, though multiple, distal gastric ulcers. Biopsies pending to rule out H. Pylori. He did have one angiodysplastic lesion on the lesser curve which was ablated with APC He should have twice a day PPI for at least a month and then daily indefinitely thereafter I would be okay resuming anticoagulation in 3-5 days, but would continue PPI indefinitely. He will also need close monitoring of his hemoglobin as an outpatient

## 2013-05-05 NOTE — Transfer of Care (Signed)
Immediate Anesthesia Transfer of Care Note  Patient: Craig Martin  Procedure(s) Performed: Procedure(s): ESOPHAGOGASTRODUODENOSCOPY (EGD) (N/A)  Patient Location: PACU and Endoscopy Unit  Anesthesia Type:MAC  Level of Consciousness: sedated  Airway & Oxygen Therapy: Patient Spontanous Breathing and Patient connected to nasal cannula oxygen  Post-op Assessment: Report given to PACU RN, Post -op Vital signs reviewed and stable and Patient moving all extremities X 4  Post vital signs: Reviewed and stable  Complications: No apparent anesthesia complications

## 2013-05-05 NOTE — H&P (Signed)
Note: This document was prepared with digital dictation and possible smart phrase technology. Any transcriptional errors that result from this process are unintentional.   Craig Martin PIR:[id number] DOB: [birth year] DOA: 05/02/2013 PCP: [name], MD  Brief narrative:  80 y/o ?, Known H/o CAD s/p Circ stent 2004, Non-insulin dependant ty 2 DM, Htn, Hld, Afib CHad2Vasc2 score=7 on pradaxa, critical R LE edema with Melanoma [follwed by Plastic surgery], h/p TURP recent admission CHF exacerbation 01/11/13, admitted with Dark and tarry stools over a two-day period of time. At baseline he is very poor health literacy and does not seem to understand some of his medical conditions including atrial fibrillation and his need for Pradaxa and other medications  Past medical history-As per Problem list Chart reviewed as below- None  Consultants:  Cardiology  Procedures: ECHO 5/2 Study Conclusions  - Left ventricle: The cavity size was moderately dilated. There was mild focal basal hypertrophy of the septum. Systolic function was severely reduced. The estimated ejection fraction was in the range of 25% to 30%. Diffuse hypokinesis. There is akinesis of the basalinferior myocardium. - Aortic valve: Moderate thickening and calcification, consistent with sclerosis. - Mitral valve: Mild to moderate regurgitation. - Left atrium: The atrium was mildly dilated. - Right atrium: The atrium was moderately to severely dilated. - Tricuspid valve: Moderate-severe regurgitation. - Pulmonary arteries: PA peak pressure: 59mm Hg (S). Impressions:  - The right ventricular systolic pressure was increased consistent with severe pulmonary hypertension.   Antibiotics:  None   Subjective  Alert pleasant tolerating diet.  Tolerated clears after Endo Didn't understand meaning of test-tried to explain in layman terms    Objective    Interim  History: None  Telemetry: none   Objective: Filed Vitals:   05/05/13 1040 05/05/13 1146 05/05/13 1150 05/05/13 1200  BP: 96/45 98/67 98/67  103/68  Pulse: 75 79 84 85  Temp:  97.9 F (36.6 C)    TempSrc:  Oral    Resp: 14  22 19   Height:      Weight:      SpO2: 99% 97% 97% 96%    Intake/Output Summary (Last 24 hours) at 05/05/13 1359 Last data filed at 05/05/13 1138  Gross per 24 hour  Intake    440 ml  Output   4500 ml  Net  -4060 ml    Exam:  General: Alert pleasant oriented no apparent distress Cardiovascular: S1-S2 no murmur rub or gallop, JVD improved Respiratory: Clinically clear no added sound Abdomen: Soft nontender nondistended no rebound lower quadrant scar Skin intact Neuro grossly intact, moving all 4 limbs equally  Data Reviewed: Basic Metabolic Panel:  Recent Labs Lab 05/02/13 1746 05/03/13 0409 05/04/13 0745 05/05/13 0526  NA 140 143 142 138  K 4.4 4.5 3.8 3.4*  CL 103 109 108 105  CO2 13* 15* 18* 21  GLUCOSE 121* 132* 82 85  BUN 36* 41* 41* 35*  CREATININE 1.77* 1.82* 2.00* 1.85*  CALCIUM 8.2* 8.2* 7.5* 7.5*   Liver Function Tests:  Recent Labs Lab 05/02/13 1746 05/04/13 0745 05/05/13 0526  AST 19 53* 36  ALT 17 48 41  ALKPHOS 56 46 44  BILITOT 1.5* 1.4* 1.4*  PROT 8.5* 7.0 7.0  ALBUMIN 2.3* 1.9* 1.8*   No results found for this basename: LIPASE, AMYLASE,  in the last 168 hours No results found for this basename: AMMONIA,  in the last 168 hours CBC:  Recent Labs Lab 05/02/13 1746 05/02/13 2300 05/03/13 1400 05/03/13 2341  05/04/13 0745 05/05/13 0526  WBC 7.3  --   --   --   --  4.9  NEUTROABS 5.7  --   --   --   --   --   HGB 6.3* 6.2* 7.8* 8.1* 7.5* 8.3*  HCT 20.4* 19.1* 24.2* 24.9* 23.8* 25.6*  MCV 86.8  --   --   --   --  85.6  PLT 291  --   --   --   --  194   Cardiac Enzymes:  Recent Labs Lab 05/02/13 2300 05/03/13 0407 05/03/13 0935  TROPONINI 0.34* 0.53* 0.74*   BNP: No components found with this  basename: POCBNP,  CBG:  Recent Labs Lab 05/05/13 0531 05/05/13 0820 05/05/13 0939 05/05/13 1046 05/05/13 1102  GLUCAP 81 75 70 64* 115*    No results found for this or any previous visit (from the past 240 hour(s)).   Studies:              All Imaging reviewed and is as per above notation   Scheduled Meds: . carvedilol  3.125 mg Oral BID WC  . insulin aspart  0-9 Units Subcutaneous 6 times per day  . latanoprost  1 drop Both Eyes QHS  . pantoprazole  40 mg Oral BID AC  . sodium chloride  3 mL Intravenous Q12H   Continuous Infusions: . sodium chloride 20 mL/hr at 05/05/13 0525  . sodium chloride    . lactated ringers 50 mL/hr at 05/05/13 0942     Assessment/Plan:  1. Acute GI bleed leading to hypovolemic shock- Now stabilized.  Gastroenterology dide scope am 5/3-shallow ulcers.  transition Protonix 40 twice a day IV-->40 bid PO for suspected upper source-he might need both endoscopy and colonoscopy.  No further stools reported.  IV fluids D/c. 2. Anemia of acute blood loss-has been transfused 2 units blood 05/02/13. Recheck hematocrit hemoglobin in am.  Will transfuse 1 more unit 05/04/13 3. CAD-aspirin obviously on hold for now. Cardiology has started low dose coreg as mild sinus tach with PC's on 05/04/13 am 4. Mildly decompensated Acute Systolic heart rapidly/CHF-Echo shows EF of 25-30% bedside JVD is elevated. One-time dose Lasix 40 mg in addition to Lasix after transfusion. We'll reevaluate in the morning-may need ischemic workup as an outpatient. 5. Hypertension with borderline hypotension-hold off on Cozaar 25 daily for now-see above note 6. Non Insulin-dependent diabetes mellitus on oral agents. Cover with SSI only if CBG above 200.  Nursing made aware 7. Afib, Chad2Vasc=7-Needs AC about 3-5 days s/p Upper GI-probably 05/10/13 would restart 8. Significant right lower extremity ischemia-has had ABIs performed 05/21/12 with poor blood flow-followed by general surgery/plastic  surgery. Wound Care nurse has been consulted for further management as has a wound VAC 9. Right lower extremity melanoma-will need close followup with oncologist  Code Status: Full Family Communication: Called ? # fr HCPOA-Glad,[name] Brother [phone] with brother safest plan is for potentially home-will discuss further with therapy Disposition Plan: SNF  Verneita Griffes, MD  [facility] Pager [phone] 05/05/2013, 1:59 PM    LOS: 3 days

## 2013-05-05 NOTE — Progress Notes (Signed)
Hypoglycemic Event  CBG: 64  Treatment: D50 IV 25 mL  Symptoms: None  Follow-up CBG: Time: 1105 CBG Result: 115  Possible Reasons for Event: Inadequate meal intake  Comments/MD notified:Dr. Tobias Alexander notified, ordered D50 32ml    Lavena Bullion  Remember to initiate Hypoglycemia Order Set & complete

## 2013-05-05 NOTE — Care Management Note (Addendum)
  Page 2 of 2   05/06/2013     5:08:36 PM CARE MANAGEMENT NOTE 05/06/2013  Patient:  UPTON, RUSSEY   Account Number:  [id number]  Date Initiated:  05/05/2013  Documentation initiated by:  Mariann Laster  Subjective/Objective Assessment:   admitted with anemia     Action/Plan:   CM to follow for dispositon needs   Anticipated DC Date:  05/08/2013   Anticipated DC Plan:  HOME/SELF CARE  In-house referral  Clinical Social Worker      DC Forensic scientist  CM consult      [facility] Choice  HOME HEALTH   Choice offered to / List presented to:  C-1 Patient        [place] arranged  HH-1 RN  [place]      [place].   Status of service:  Completed, signed off Medicare Important Message given?   (If response is "NO", the following Medicare IM given date fields will be blank) Date Medicare IM given:   Date Additional Medicare IM given:    Discharge Disposition:  [place]  Per UR Regulation:  Reviewed for med. necessity/level of care/duration of stay  If discussed at [place] of Stay Meetings, dates discussed:    Comments:  Dylanie Quesenberry RN, BSN, MSHL, CCM  Nurse - Case Manager, ([facility] [phone]  05/06/2013 SNF/[facility] - refused Dispositon:  Home / RN, PT, CNA, SW ([place] / Butch Penny notified) DME RECS:  Wheelchair cushion (measurements PT)  added to HHS:  PT order.

## 2013-05-05 NOTE — Progress Notes (Signed)
Patient Profile: This is a 80 y.o. male with a past medical history significant for PAD and CAD s/p PCI to the LCx in 2004 by Dr. Tami Ribas and moderate LAD and RCA disease. He recently underwent a melanoma excision and lymph node biopsy in Dec 2014. At that time he was noted to have post-op a-fib and PVC's. He also had NSVT - a few runs of 8-9 beats, generally asymptomatic. EKG showed a-fib, LAFB, and Right bundle morphology PVC's, suggesting an LV origin. His echocardiogram at his last admission was normal, with EF of 60%.  He was readmitted in January 2015 with SOB and was found to be in acute on chronic diastolic CHF after running out of his meds. He was diuresed and sent home. Of note he has had chronic anemia with a hemoglobin in the 7 range with no evidence of GI bleeding and is on chronic Pradaxa for chronic afib.   [facility] with complaints of increased SOB over several days with increased LE edema. He denied any chest pain. He was found in the ER to be mildly tachycardic with an elevated troponin at 0.18 and elevated BNP at 5942 c/w acute on chronic diastolic CHF. He was also found to have a hemoglobin of 6.3. His Hbg 12/04/2012 was 10.5 and 7.6 on 01/11/2013. His chest xray shows a LLL PNA.    Subjective:   Objective: Vital signs in last 24 hours: Temp:  [97.4 F (36.3 C)-98 F (36.7 C)] 97.8 F (36.6 C) (05/04 0925) Pulse Rate:  [61-107] 75 (05/04 1040) Resp:  [11-20] 14 (05/04 1040) BP: (72-127)/(39-98) 96/45 mmHg (05/04 1040) SpO2:  [98 %-100 %] 99 % (05/04 1040) Weight:  [180 lb (81.647 kg)] 180 lb (81.647 kg) (05/04 0430) Last BM Date: 05/04/13  Intake/Output from previous day: 05/03 0701 - 05/04 0700 In: 1347.1 [P.O.:960; Blood:387.1] Out: 4000 [Urine:4000] Intake/Output this shift:    Medications Current Facility-Administered Medications  Medication Dose Route Frequency Provider Last Rate Last Dose  . 0.9 %  sodium chloride infusion   Intravenous  Continuous Juanita Craver, MD 20 mL/hr at 05/05/13 0525    . 0.9 %  sodium chloride infusion   Intravenous Continuous Vena Rua, PA-C      . acetaminophen (TYLENOL) tablet 650 mg  650 mg Oral Q6H PRN Theressa Millard, MD   650 mg at 05/03/13 1018   Or  . acetaminophen (TYLENOL) suppository 650 mg  650 mg Rectal Q6H PRN Theressa Millard, MD      . alum & mag hydroxide-simeth (MAALOX/MYLANTA) 200-200-20 MG/5ML suspension 30 mL  30 mL Oral Q6H PRN Theressa Millard, MD      . carvedilol (COREG) tablet 3.125 mg  3.125 mg Oral BID WC Satira Sark, MD   3.125 mg at 05/05/13 6144  . dextrose 50 % solution 25 mL  25 mL Intravenous Once PRN Duane Boston, MD      . insulin aspart (novoLOG) injection 0-9 Units  0-9 Units Subcutaneous 6 times per day Nita Sells, MD   1 Units at 05/05/13 0058  . lactated ringers infusion   Intravenous Continuous Duane Boston, MD 50 mL/hr at 05/05/13 [id number]    . latanoprost (XALATAN) 0.005 % ophthalmic solution 1 drop  1 drop Both Eyes QHS Theressa Millard, MD   1 drop at 05/04/13 2252  . ondansetron (ZOFRAN) tablet 4 mg  4 mg Oral Q6H PRN Theressa Millard, MD  Or  . ondansetron (ZOFRAN) injection 4 mg  4 mg Intravenous Q6H PRN Theressa Millard, MD      . oxyCODONE (Oxy IR/ROXICODONE) immediate release tablet 5 mg  5 mg Oral Q4H PRN Theressa Millard, MD      . pantoprazole (PROTONIX) injection 40 mg  40 mg Intravenous Q12H Theressa Millard, MD   40 mg at 05/04/13 2230  . sodium chloride 0.9 % injection 3 mL  3 mL Intravenous Q12H Theressa Millard, MD   3 mL at 05/04/13 2230   Facility-Administered Medications Ordered in Other Encounters  Medication Dose Route Frequency Provider Last Rate Last Dose  . lactated ringers infusion    Continuous PRN Neldon Newport, CRNA      . phenylephrine (NEO-SYNEPHRINE) injection    Anesthesia Intra-op Neldon Newport, CRNA   40 mcg at 05/05/13 1125  . propofol (DIPRIVAN) 10 mg/mL bolus/IV push     Anesthesia Intra-op Neldon Newport, CRNA   20 mg at 05/05/13 1125    PE: See below   Lab Results:   Recent Labs  05/02/13 1746  05/03/13 2341 05/04/13 0745 05/05/13 0526  WBC 7.3  --   --   --  4.9  HGB 6.3*  < > 8.1* 7.5* 8.3*  HCT 20.4*  < > 24.9* 23.8* 25.6*  PLT 291  --   --   --  194  < > = values in this interval not displayed. BMET  Recent Labs  05/03/13 0409 05/04/13 0745 05/05/13 0526  NA 143 142 138  K 4.5 3.8 3.4*  CL 109 108 105  CO2 15* 18* 21  GLUCOSE 132* 82 85  BUN 41* 41* 35*  CREATININE 1.82* 2.00* 1.85*  CALCIUM 8.2* 7.5* 7.5*   PT/INR  Recent Labs  05/02/13 1850  LABPROT 21.1*  INR 1.89*   Cardiac Panel (last 3 results)  Recent Labs  05/02/13 2300 05/03/13 0407 05/03/13 0935  TROPONINI 0.34* 0.53* 0.74*      Assessment/Plan  Principal Problem:   Anemia due to blood loss Active Problems:   Coronary artery disease   Essential hypertension   Hyperlipidemia   Diabetes   Malignant melanoma of right foot   Coagulopathy   Atrial fibrillation   GI bleed   Chronic diastolic heart failure   SOB (shortness of breath)   Elevated troponin  1. Mild elevation of troponin I, probable type II NSTEMI in the setting of severe anemia and rapid atrial fibrillation.   2. Severe anemia, initial hemoglobin 6.3, GI bleed suspected. Pradaxa has been discontinued and GI consult pending with packed red cell transfusions. Hemoglobin up to 8.3.   3. Acute systolic heart failure in the setting of above comorbidities. LVEF down to range of 25-30% by recent followup echocardiogram. Also severe pulmonary hypertension. He has known history of CAD, moderate multivessel status post BMS to circumflex in 2004. Has likely had progression. Now on low dose Coreg. BP slight soft. Will need to monitor closely. HR stable.   4. Malignant melanoma of right foot status post recent excision.   LOS: 3 days    Brittainy M. Rosita Fire, PA-C 05/05/2013 11:29  AM  History and all data above reviewed.  Patient examined.  I agree with the findings as above.  The patient exam reveals EUM:[id number]  ,  Lungs: Clear  ,  Abd: Positive bowel sounds, no rebound no guarding, Ext No edema   .  All available labs, radiology testing, previous  records reviewed. Agree with documented assessment and plan. Atrial fib:  EGD today.  Results noted.  I sent a note to Dr. Hilarie Fredrickson asking him about when it would be safe to restart NOAC.  Anemia:  Per primary team.  Acute on chronic systolic HF:  No plans for ischemia work up.  Rather continue medical management of CHF.  Off of ARB for now. However, low dose beta blocker started.  Continue current dose.   His I/Os and weights are not helpful.  I think he is euvolemic.   Jeneen Rinks Keaten Mashek  12:31 PM  05/05/2013

## 2013-05-05 NOTE — Anesthesia Procedure Notes (Signed)
Procedure Name: MAC Date/Time: 05/05/2013 11:28 AM Performed by: Neldon Newport Pre-anesthesia Checklist: Emergency Drugs available, Timeout performed, Patient being monitored, Patient identified and Suction available Patient Re-evaluated:Patient Re-evaluated prior to inductionOxygen Delivery Method: Nasal cannula Placement Confirmation: positive ETCO2

## 2013-05-05 NOTE — Progress Notes (Signed)
UR completed Saran Laviolette K. Niana Martorana, RN, BSN, [place], CCM  05/05/2013 12:03 PM

## 2013-05-06 ENCOUNTER — Encounter (HOSPITAL_COMMUNITY): Payer: Self-pay | Admitting: Internal Medicine

## 2013-05-06 LAB — GLUCOSE, CAPILLARY
GLUCOSE-CAPILLARY: 111 mg/dL — AB (ref 70–99)
GLUCOSE-CAPILLARY: 178 mg/dL — AB (ref 70–99)
GLUCOSE-CAPILLARY: 99 mg/dL (ref 70–99)
Glucose-Capillary: 92 mg/dL (ref 70–99)
Glucose-Capillary: 148 mg/dL — ABNORMAL HIGH (ref 70–99)

## 2013-05-06 LAB — HEMOGLOBIN AND HEMATOCRIT, BLOOD
HCT: 26.1 % — ABNORMAL LOW (ref 39.0–52.0)
Hemoglobin: 8.4 g/dL — ABNORMAL LOW (ref 13.0–17.0)

## 2013-05-06 MED ORDER — GLUCOSE 40 % PO GEL: 1.0000 | Freq: Once | ORAL | Status: DC | PRN

## 2013-05-06 MED ORDER — DEXTROSE 5 % IV SOLN: INTRAVENOUS | Status: DC

## 2013-05-06 MED ORDER — CARVEDILOL 3.125 MG PO TABS: 3.1250 mg | ORAL_TABLET | Freq: Two times a day (BID) | ORAL | Status: DC

## 2013-05-06 MED ORDER — ASPIRIN 81 MG PO TABS: 81.0000 mg | ORAL_TABLET | Freq: Every day | ORAL | Status: DC

## 2013-05-06 MED ORDER — PANTOPRAZOLE SODIUM 40 MG PO TBEC: 40.0000 mg | DELAYED_RELEASE_TABLET | Freq: Two times a day (BID) | ORAL | Status: DC

## 2013-05-06 MED ORDER — DABIGATRAN ETEXILATE MESYLATE 75 MG PO CAPS: 75.0000 mg | ORAL_CAPSULE | Freq: Two times a day (BID) | ORAL | Status: DC

## 2013-05-06 NOTE — Discharge Summary (Signed)
Physician Discharge Summary  Craig Martin ZOX:[id number] DOB: 04-17-1926 DOA: 05/02/2013  PCP: [name], MD  Admit date: 05/02/2013 Discharge date: 05/06/2013  Time spent: 50 minutes  Recommendations for Outpatient Follow-up:  1. DO NOT start ASA/PLAVIX  until 05/10/13 because of acute upper GI bleed 2. Recommend Bmet, CBC in about a week 3. Recommend close followup with gastroenterology-may need treatment for H. pylori as an outpatient depending on biopsy results 4. Consider CT abdomen pelvis as an outpatient given history of metastatic melanoma  5. Recommend followup as an outpatient with plastic surgery for wound VAC and wound care. 6. Follow with cardiology prn.  Discharge Diagnoses:  Principal Problem:   Anemia due to blood loss Active Problems:   Coronary artery disease   Essential hypertension   Hyperlipidemia   Diabetes   Malignant melanoma of right foot   Coagulopathy   Atrial fibrillation   GI bleed   Chronic diastolic heart failure   SOB (shortness of breath)   Elevated troponin   Angiodysplasia of stomach   Multiple gastric ulcers   Discharge Condition: stable  Diet recommendation: heart healthy low salt  Filed Weights   05/04/13 0823 05/05/13 0430 05/06/13 0427  Weight: 83.3 kg (183 lb 10.3 oz) 81.647 kg (180 lb) 88.6 kg (195 lb 5.2 oz)    History of present illness:  80 y/o ?, Known H/o CAD s/p Circ stent 2004, Non-insulin dependant ty 2 DM, Htn, Hld, Afib CHad2Vasc2 score=7 on pradaxa, critical R LE edema with Melanoma [follwed by Plastic surgery], h/p TURP recent admission CHF exacerbation 01/11/13, admitted with Dark and tarry stools over a two-day period of time.  At baseline he is very poor health literacy and does not seem to understand some of his medical conditions including atrial fibrillation and his need for Pradaxa and other medications.   Hospital Course:   1. Acute GI bleed leading to hypovolemic shock- Now stabilized. Gastroenterology  did scope am 5/3-shallow ulcers. transition Protonix 40 twice a day IV-->40 bid PO indefinitely. No further dark tarry stools reported. IV fluids D/C 05/05/13 2. Anemia of acute blood loss-has been transfused 2 units blood 05/02/13. Recheck hematocrit hemoglobin in am. Will transfuse 1 more unit 05/04/13-hemoglobin is remain stable in the 8 range and he has had no further obvious evidences of bleeding 3. CAD-aspirin obviously on hold for now. Cardiology has started low dose coreg as mild sinus tach with PC's on 05/04/13 am.  Will need to uptitrate as an outpatient monitor-C. #7 with 4. Mildly decompensated Acute Systolic heart rapidly/CHF-Echo shows EF of 25-30% bedside JVD is elevated. Was placed on IV Lasix during this admission and was discharged home on home dose of by mouth Lasix 20 mg daily if not discharge to skilled nursing facility, will need home health daily weights as well as application of medications 5. Non Insulin-dependent diabetes mellitus on oral agents. Cover with SSI only if CBG above 200.  6. Afib, Chad2Vasc=7-Needs AC about 3-5 days s/p Upper GI-probably 05/10/13 would restartboth aspirin 81 as well as productive 75 . This will be reinforced with patient and will need to be monitored by his brother who are date if he goes home instead of skilled nurse facility  7. Significant right lower extremity ischemia-has had ABIs performed 05/21/12 with poor blood flow-followed by general surgery/plastic surgery. Wound Care nurse has been consulted for further management as has a wound VAC-plastic surgeon saw the patient and the we will follow him closely 8. Right lower extremity melanoma-will need  close followup with oncologist-recommend staging and repeat CT scan abdomen pelvis in a time period determined by them   Procedures:  endoscopy 05/04/13= ulcer pressure Consultations:   gastroenterology  Cardiology   Discharge Exam: Filed Vitals:   05/06/13 0427  BP: 103/60  Pulse: 116  Temp: 98 F  (36.7 C)  Resp: 20    alert sleepy feels little tired today   General:  EOMI, NCAT  Cardiovascular:  S1-S2 no murmur rub or gallop telemetry benign  Respiratory:  clear   Discharge Instructions You were cared for by a hospitalist during your hospital stay. If you have any questions about your discharge medications or the care you received while you were in the hospital after you are discharged, you can call the unit and asked to speak with the hospitalist on call if the hospitalist that took care of you is not available. Once you are discharged, your primary care physician will handle any further medical issues. Please note that NO REFILLS for any discharge medications will be authorized once you are discharged, as it is imperative that you return to your primary care physician (or establish a relationship with a primary care physician if you do not have one) for your aftercare needs so that they can reassess your need for medications and monitor your lab values.  Discharge Orders   Future Appointments Provider Department Dept Phone   05/12/2013 10:00 AM [facility] [phone]   Future Orders Complete By Expires   Diet - low sodium heart healthy  As directed    Discharge instructions  As directed    Increase activity slowly  As directed        Medication List         acetaminophen 500 MG tablet  Commonly known as:  TYLENOL  Take 1,000 mg by mouth every 6 (six) hours as needed.     aspirin 81 MG tablet  Take 1 tablet (81 mg total) by mouth daily.     carvedilol 3.125 MG tablet  Commonly known as:  COREG  Take 1 tablet (3.125 mg total) by mouth 2 (two) times daily with a meal.     dabigatran 75 MG Caps capsule  Commonly known as:  PRADAXA  Take 1 capsule (75 mg total) by mouth 2 (two) times daily.     DSS 100 MG Caps  Take 100 mg by mouth 2 (two) times daily.     furosemide 20 MG tablet  Commonly known as:  LASIX  Take  20 mg by mouth daily.     linagliptin 5 MG Tabs tablet  Commonly known as:  TRADJENTA  Take 1 tablet (5 mg total) by mouth daily.     losartan 25 MG tablet  Commonly known as:  COZAAR  Take 1 tablet (25 mg total) by mouth daily.     LUMIGAN 0.01 % Soln  Generic drug:  bimatoprost  Place 1 drop into both eyes at bedtime.     pantoprazole 40 MG tablet  Commonly known as:  PROTONIX  Take 1 tablet (40 mg total) by mouth 2 (two) times daily before a meal.     traMADol 50 MG tablet  Commonly known as:  ULTRAM  Take 50 mg by mouth every 6 (six) hours as needed for moderate pain.       No Known Allergies     Follow-up Information   Follow up with [name], MD In 1 week.  Specialty:  Internal Medicine   Contact information:   [place] [address] [phone]       Follow up with PYRTLE, Lajuan Lines, MD. Schedule an appointment as soon as possible for a visit in 6 weeks.   Specialty:  Gastroenterology   Contact information:   [address] [place] [address] [phone]        The results of significant diagnostics from this hospitalization (including imaging, microbiology, ancillary and laboratory) are listed below for reference.    Significant Diagnostic Studies: Dg Chest 2 View  05/02/2013   CLINICAL DATA:  Worsening shortness of breath.  Ex-smoker.  EXAM: CHEST  2 VIEW  COMPARISON:  01/11/2013.  FINDINGS: Stable enlarged cardiac silhouette. Small amount of airspace opacity at the posterior lung bases on the lateral view, most likely on left, medially on the frontal view. The remainder of the lungs are clear with normal vascularity. Mild thoracic spine degenerative changes. Mild central peribronchial thickening.  IMPRESSION: 1. Mild left lower lobe pneumonia or patchy atelectasis. 2. Mild bronchitic changes. 3. Cardiomegaly.   Electronically Signed   By: Enrique Sack M.D.   On: 05/02/2013 18:50    Microbiology: No results found for  this or any previous visit (from the past 240 hour(s)).   Labs: Basic Metabolic Panel:  Recent Labs Lab 05/02/13 1746 05/03/13 0409 05/04/13 0745 05/05/13 0526  NA 140 143 142 138  K 4.4 4.5 3.8 3.4*  CL 103 109 108 105  CO2 13* 15* 18* 21  GLUCOSE 121* 132* 82 85  BUN 36* 41* 41* 35*  CREATININE 1.77* 1.82* 2.00* 1.85*  CALCIUM 8.2* 8.2* 7.5* 7.5*   Liver Function Tests:  Recent Labs Lab 05/02/13 1746 05/04/13 0745 05/05/13 0526  AST 19 53* 36  ALT 17 48 41  ALKPHOS 56 46 44  BILITOT 1.5* 1.4* 1.4*  PROT 8.5* 7.0 7.0  ALBUMIN 2.3* 1.9* 1.8*   No results found for this basename: LIPASE, AMYLASE,  in the last 168 hours No results found for this basename: AMMONIA,  in the last 168 hours CBC:  Recent Labs Lab 05/02/13 1746  05/03/13 1400 05/03/13 2341 05/04/13 0745 05/05/13 0526 05/06/13 0520  WBC 7.3  --   --   --   --  4.9  --   NEUTROABS 5.7  --   --   --   --   --   --   HGB 6.3*  < > 7.8* 8.1* 7.5* 8.3* 8.4*  HCT 20.4*  < > 24.2* 24.9* 23.8* 25.6* 26.1*  MCV 86.8  --   --   --   --  85.6  --   PLT 291  --   --   --   --  194  --   < > = values in this interval not displayed. Cardiac Enzymes:  Recent Labs Lab 05/02/13 2300 05/03/13 0407 05/03/13 0935  TROPONINI 0.34* 0.53* 0.74*   BNP: BNP (last 3 results)  Recent Labs  01/11/13 0145 05/02/13 1746  PROBNP 6537.0* 5942.0*   CBG:  Recent Labs Lab 05/05/13 1709 05/05/13 2010 05/06/13 0014 05/06/13 0418 05/06/13 0759  GLUCAP 146* 166* 111* 99 92       Signed:  Jai-Gurmukh Sydney Azure  [facility] 05/06/2013, 10:23 AM

## 2013-05-06 NOTE — Progress Notes (Signed)
Patient seen, examined, and I agree with the above documentation, including the assessment and plan. Agree with holding anticoagulation for an additional 3-5 days Would continue indefinite daily PPI after completing twice daily PPI for one month Biopsy was negative for H. Pylori Agree with outpatient CT of the abdomen and pelvis along with close monitoring of hemoglobin and hematocrit

## 2013-05-06 NOTE — Progress Notes (Signed)
Physical Therapy Treatment Patient Details Name: Craig Martin MRN: [id number] DOB: 29-Nov-1926 Today's Date: 05/06/2013    History of Present Illness Dark and tarry stools over a two-day period of time; Pt with decr Hgb, 7.5 the am of 5/3; has recieved blood transfusion    PT Comments    Pt pleasant & agreeable to participate in therapy.  Pt states he has not stood to transfer into chair for a while now so session focused on lateral scoot into recliner.  Pt moves fairly well, only required min assist to get hips completely into recliner.     Follow Up Recommendations  SNF     Equipment Recommendations  Wheelchair cushion (measurements PT)    Recommendations for Other Services OT consult     Precautions / Restrictions Precautions Precautions: Fall Precaution Comments: VAC to R foot; PRAFO boot in room Required Braces or Orthoses: Other Brace/Splint Other Brace/Splint: PRAFO boot (has walking sole) for getting up    Mobility  Bed Mobility Overal bed mobility: Needs Assistance Bed Mobility: Supine to Sit     Supine to sit: Min guard;HOB elevated     General bed mobility comments: def use of UE's on rail to pull himself to sitting upright.  HOB elevated ~50 degrees.    Transfers Overall transfer level: Needs assistance Equipment used: None Transfers: Lateral/Scoot Transfers          Lateral/Scoot Transfers: Min assist General transfer comment: Performed lateral scoot transfer bed to recliner with drop arm down; Overall, pt moved well, and needed guard assist for safety and min assist for optimal positioning in chair; Pt demonstrated for this therapist how he crosses R LE over LLE to keep from putting weight on his R foot during transfers  Ambulation/Gait                 Stairs            Wheelchair Mobility    Modified Rankin (Stroke Patients Only)       Balance                                    Cognition Arousal/Alertness:  Awake/alert Behavior During Therapy: WFL for tasks assessed/performed Overall Cognitive Status: Within Functional Limits for tasks assessed                      Exercises      General Comments General comments (skin integrity, edema, etc.): VAC to R foot      Pertinent Vitals/Pain No pain reported.      Home Living Family/patient expects to be discharged to:: Private residence Living Arrangements: Other (Comment) (lives with brother)                  Prior Function            PT Goals (current goals can now be found in the care plan section) Acute Rehab PT Goals Patient Stated Goal: Did not state PT Goal Formulation: With patient Time For Goal Achievement: 05/18/13 Potential to Achieve Goals: Good Progress towards PT goals: Progressing toward goals    Frequency  Min 3X/week    PT Plan Current plan remains appropriate    Co-evaluation             End of Session   Activity Tolerance: Patient tolerated treatment well Patient left: in chair;with call bell/phone within reach  Time: 9735-3299 PT Time Calculation (min): 13 min  Charges:  $Therapeutic Activity: 8-22 mins                       Sena Hitch 05/06/2013, 11:48 AM  Sarajane Marek, PTA [phone] 05/06/2013

## 2013-05-06 NOTE — Progress Notes (Signed)
          Daily Rounding Note  05/06/2013, 9:19 AM  LOS: 4 days   SUBJECTIVE:       No complaints.  No nausea, no abdominal pain.  No BMs  OBJECTIVE:         Vital signs in last 24 hours:    Temp:  [97.4 F (36.3 C)-98 F (36.7 C)] 98 F (36.7 C) (05/05 0427) Pulse Rate:  [75-116] 116 (05/05 0427) Resp:  [11-22] 20 (05/05 0427) BP: (72-106)/(39-68) 103/60 mmHg (05/05 0427) SpO2:  [96 %-100 %] 96 % (05/05 0427) Weight:  [88.6 kg (195 lb 5.2 oz)] 88.6 kg (195 lb 5.2 oz) (05/05 0427) Last BM Date: 05/05/13 General: Frail, chronically ill looking   Heart: RRR Chest: clear bil, decreased BS.  + cough Abdomen: soft, ND, NT.  Active BS  Extremities: right heal and foot bandaged. Neuro/Psych:  Cooperative, pleasant, appropriate.  Moves all 4 limbs.  No tremor  Intake/Output from previous day: 05/04 0701 - 05/05 0700 In: 920 [P.O.:720; I.V.:200] Out: 2425 [Urine:2375; Drains:50]  Intake/Output this shift: Total I/O In: 240 [P.O.:240] Out: 200 [Urine:200]  Lab Results:  Recent Labs  05/04/13 0745 05/05/13 0526 05/06/13 0520  WBC  --  4.9  --   HGB 7.5* 8.3* 8.4*  HCT 23.8* 25.6* 26.1*  PLT  --  194  --    BMET  Recent Labs  05/04/13 0745 05/05/13 0526  NA 142 138  K 3.8 3.4*  CL 108 105  CO2 18* 21  GLUCOSE 82 85  BUN 41* 35*  CREATININE 2.00* 1.85*  CALCIUM 7.5* 7.5*   LFT  Recent Labs  05/04/13 0745 05/05/13 0526  PROT 7.0 7.0  ALBUMIN 1.9* 1.8*  AST 53* 36  ALT 48 41  ALKPHOS 46 44  BILITOT 1.4* 1.4*   PT/INR No results found for this basename: LABPROT, INR,  in the last 72 hours Hepatitis Panel No results found for this basename: HEPBSAG, HCVAB, HEPAIGM, HEPBIGM,  in the last 72 hours  Studies/Results: No results found.  Scheduled Meds: . carvedilol  3.125 mg Oral BID WC  . insulin aspart  0-9 Units Subcutaneous 6 times per day  . latanoprost  1 drop Both Eyes QHS  . pantoprazole  40  mg Oral BID AC  . sodium chloride  3 mL Intravenous Q12H   Continuous Infusions: . sodium chloride 20 mL/hr at 05/05/13 0525  . sodium chloride    . lactated ringers 50 mL/hr at 05/05/13 0942   PRN Meds:.acetaminophen, acetaminophen, alum & mag hydroxide-simeth, morphine injection, ondansetron (ZOFRAN) IV, ondansetron, oxyCODONE, promethazine   ASSESMENT:   *  Anemia, FOBT +.  S/p PRBC x 2.  EGD 05/05/13: non-bleeding, small gastric/prepyloric ulcers.  Ablation of gastric AVM.  Biopsies and H Pylori bx pending.   *  Hx malignant melanoma of right foot.  ? Should he have CT abd/pelvis for surveillance.  *  A fib, on Pradaxa PTA  *  Acute systolic heart failure. EF 25 to 30%. Pulm htn.  Moderate multivessel CAD.  BM stent in 2004.   PLAN   *  BID PPI for 4 weeks, then drop to once daily.  *  ? timiing of restarting Pradaxa? *  GI signing off.  Available prn.     Vena Rua  05/06/2013, 9:19 AM Pager: [phone]

## 2013-05-06 NOTE — Progress Notes (Signed)
       Patient Name: Craig Martin Date of Encounter: 05/06/2013    SUBJECTIVE: The patient voices no complaints. Breathing is better.  TELEMETRY:  A fib with borderline rate control Filed Vitals:   05/05/13 1200 05/05/13 1503 05/05/13 2014 05/06/13 0427  BP: 103/68 97/46 99/57  103/60  Pulse: 85 91 81 116  Temp:  97.7 F (36.5 C) 97.4 F (36.3 C) 98 F (36.7 C)  TempSrc:  Oral Oral Oral  Resp: 19 20 20 20   Height:      Weight:    195 lb 5.2 oz (88.6 kg)  SpO2: 96% 100% 100% 96%    Intake/Output Summary (Last 24 hours) at 05/06/13 0938 Last data filed at 05/06/13 0900  Gross per 24 hour  Intake   1160 ml  Output   2625 ml  Net  -1465 ml   LABS: Basic Metabolic Panel:  Recent Labs  05/04/13 0745 05/05/13 0526  NA 142 138  K 3.8 3.4*  CL 108 105  CO2 18* 21  GLUCOSE 82 85  BUN 41* 35*  CREATININE 2.00* 1.85*  CALCIUM 7.5* 7.5*   CBC:  Recent Labs  05/05/13 0526 05/06/13 0520  WBC 4.9  --   HGB 8.3* 8.4*  HCT 25.6* 26.1*  MCV 85.6  --   PLT 194  --      Radiology/Studies:  No new data  Physical Exam: Blood pressure 103/60, pulse 116, temperature 98 F (36.7 C), temperature source Oral, resp. rate 20, height 5\' 9"  (1.753 m), weight 195 lb 5.2 oz (88.6 kg), SpO2 96.00%. Weight change: 11 lb 11 oz (5.3 kg)  Wt Readings from Last 3 Encounters:  05/06/13 195 lb 5.2 oz (88.6 kg)  05/06/13 195 lb 5.2 oz (88.6 kg)  05/06/13 195 lb 5.2 oz (88.6 kg)    IIRR  ASSESSMENT:  1. Type 2 NSTEMI, no further ischemic w/u planned. 2. CAD, presumed stable 3. A/Chronic SHF  Plan:  1. Diuresis as needed to keep the patient euvolemic 2. Please call if we can help further.  Signed, Belva Crome III 05/06/2013, 9:38 AM

## 2013-05-06 NOTE — Progress Notes (Signed)
Pt's brother called and notified about pt's discharge from hospital today.  Pt's brother also notified to bring pt's wound vac charger when he comes to pick up patient.  Will continue to monitor pt.

## 2013-05-07 NOTE — Progress Notes (Signed)
Agree with the above note 

## 2013-05-12 ENCOUNTER — Encounter (HOSPITAL_BASED_OUTPATIENT_CLINIC_OR_DEPARTMENT_OTHER): Payer: Medicare Other | Attending: Plastic Surgery

## 2013-05-12 DIAGNOSIS — L97509 Non-pressure chronic ulcer of other part of unspecified foot with unspecified severity: Secondary | ICD-10-CM | POA: Insufficient documentation

## 2013-05-12 LAB — GLUCOSE, CAPILLARY: GLUCOSE-CAPILLARY: 96 mg/dL (ref 70–99)

## 2013-05-13 ENCOUNTER — Inpatient Hospital Stay (HOSPITAL_COMMUNITY)
Admission: EM | Admit: 2013-05-13 | Discharge: 2013-05-21 | DRG: 064 | Disposition: A | Discharge status: Hospice | Payer: Medicare Other | Attending: Internal Medicine | Admitting: Internal Medicine

## 2013-05-13 ENCOUNTER — Emergency Department (HOSPITAL_COMMUNITY): Payer: Medicare Other

## 2013-05-13 ENCOUNTER — Encounter (HOSPITAL_COMMUNITY): Payer: Self-pay | Admitting: Emergency Medicine

## 2013-05-13 DIAGNOSIS — E876 Hypokalemia: Secondary | ICD-10-CM | POA: Diagnosis not present

## 2013-05-13 DIAGNOSIS — Z22322 Carrier or suspected carrier of Methicillin resistant Staphylococcus aureus: Secondary | ICD-10-CM

## 2013-05-13 DIAGNOSIS — Z833 Family history of diabetes mellitus: Secondary | ICD-10-CM

## 2013-05-13 DIAGNOSIS — I619 Nontraumatic intracerebral hemorrhage, unspecified: Secondary | ICD-10-CM

## 2013-05-13 DIAGNOSIS — C7949 Secondary malignant neoplasm of other parts of nervous system: Secondary | ICD-10-CM

## 2013-05-13 DIAGNOSIS — D689 Coagulation defect, unspecified: Secondary | ICD-10-CM

## 2013-05-13 DIAGNOSIS — G936 Cerebral edema: Secondary | ICD-10-CM | POA: Diagnosis present

## 2013-05-13 DIAGNOSIS — J9690 Respiratory failure, unspecified, unspecified whether with hypoxia or hypercapnia: Secondary | ICD-10-CM

## 2013-05-13 DIAGNOSIS — E785 Hyperlipidemia, unspecified: Secondary | ICD-10-CM | POA: Diagnosis present

## 2013-05-13 DIAGNOSIS — I2510 Atherosclerotic heart disease of native coronary artery without angina pectoris: Secondary | ICD-10-CM | POA: Diagnosis present

## 2013-05-13 DIAGNOSIS — Z515 Encounter for palliative care: Secondary | ICD-10-CM

## 2013-05-13 DIAGNOSIS — Z66 Do not resuscitate: Secondary | ICD-10-CM | POA: Diagnosis not present

## 2013-05-13 DIAGNOSIS — H409 Unspecified glaucoma: Secondary | ICD-10-CM | POA: Diagnosis present

## 2013-05-13 DIAGNOSIS — M129 Arthropathy, unspecified: Secondary | ICD-10-CM | POA: Diagnosis present

## 2013-05-13 DIAGNOSIS — I5021 Acute systolic (congestive) heart failure: Secondary | ICD-10-CM

## 2013-05-13 DIAGNOSIS — Z7982 Long term (current) use of aspirin: Secondary | ICD-10-CM

## 2013-05-13 DIAGNOSIS — I629 Nontraumatic intracranial hemorrhage, unspecified: Secondary | ICD-10-CM

## 2013-05-13 DIAGNOSIS — R569 Unspecified convulsions: Secondary | ICD-10-CM | POA: Diagnosis present

## 2013-05-13 DIAGNOSIS — D649 Anemia, unspecified: Secondary | ICD-10-CM

## 2013-05-13 DIAGNOSIS — R4182 Altered mental status, unspecified: Secondary | ICD-10-CM

## 2013-05-13 DIAGNOSIS — Z7901 Long term (current) use of anticoagulants: Secondary | ICD-10-CM

## 2013-05-13 DIAGNOSIS — E41 Nutritional marasmus: Secondary | ICD-10-CM | POA: Diagnosis present

## 2013-05-13 DIAGNOSIS — Z8673 Personal history of transient ischemic attack (TIA), and cerebral infarction without residual deficits: Secondary | ICD-10-CM

## 2013-05-13 DIAGNOSIS — Z6827 Body mass index (BMI) 27.0-27.9, adult: Secondary | ICD-10-CM

## 2013-05-13 DIAGNOSIS — Z9861 Coronary angioplasty status: Secondary | ICD-10-CM

## 2013-05-13 DIAGNOSIS — Z7401 Bed confinement status: Secondary | ICD-10-CM

## 2013-05-13 DIAGNOSIS — I4891 Unspecified atrial fibrillation: Secondary | ICD-10-CM | POA: Diagnosis present

## 2013-05-13 DIAGNOSIS — I10 Essential (primary) hypertension: Secondary | ICD-10-CM | POA: Diagnosis present

## 2013-05-13 DIAGNOSIS — E119 Type 2 diabetes mellitus without complications: Secondary | ICD-10-CM | POA: Diagnosis present

## 2013-05-13 DIAGNOSIS — G9340 Encephalopathy, unspecified: Secondary | ICD-10-CM | POA: Diagnosis present

## 2013-05-13 DIAGNOSIS — C7931 Secondary malignant neoplasm of brain: Secondary | ICD-10-CM | POA: Diagnosis present

## 2013-05-13 DIAGNOSIS — R5381 Other malaise: Secondary | ICD-10-CM | POA: Diagnosis present

## 2013-05-13 DIAGNOSIS — C439 Malignant melanoma of skin, unspecified: Secondary | ICD-10-CM | POA: Diagnosis present

## 2013-05-13 DIAGNOSIS — C799 Secondary malignant neoplasm of unspecified site: Secondary | ICD-10-CM

## 2013-05-13 DIAGNOSIS — J9600 Acute respiratory failure, unspecified whether with hypoxia or hypercapnia: Secondary | ICD-10-CM | POA: Diagnosis present

## 2013-05-13 DIAGNOSIS — C4371 Malignant melanoma of right lower limb, including hip: Secondary | ICD-10-CM

## 2013-05-13 DIAGNOSIS — N179 Acute kidney failure, unspecified: Secondary | ICD-10-CM | POA: Diagnosis present

## 2013-05-13 DIAGNOSIS — Z87891 Personal history of nicotine dependence: Secondary | ICD-10-CM

## 2013-05-13 DIAGNOSIS — R791 Abnormal coagulation profile: Secondary | ICD-10-CM | POA: Diagnosis present

## 2013-05-13 LAB — URINALYSIS, ROUTINE W REFLEX MICROSCOPIC
Glucose, UA: NEGATIVE mg/dL
KETONES UR: NEGATIVE mg/dL
NITRITE: NEGATIVE
PH: 5 (ref 5.0–8.0)
Protein, ur: 100 mg/dL — AB
SPECIFIC GRAVITY, URINE: 1.023 (ref 1.005–1.030)
Urobilinogen, UA: 1 mg/dL (ref 0.0–1.0)

## 2013-05-13 LAB — TYPE AND SCREEN
ABO/RH(D): O POS
ANTIBODY SCREEN: NEGATIVE

## 2013-05-13 LAB — I-STAT ARTERIAL BLOOD GAS, ED
Acid-base deficit: 2 mmol/L (ref 0.0–2.0)
Bicarbonate: 24.6 mEq/L — ABNORMAL HIGH (ref 20.0–24.0)
O2 SAT: 100 %
PO2 ART: 469 mmHg — AB (ref 80.0–100.0)
Patient temperature: 98.6
TCO2: 26 mmol/L (ref 0–100)
pCO2 arterial: 49.1 mmHg — ABNORMAL HIGH (ref 35.0–45.0)
pH, Arterial: 7.308 — ABNORMAL LOW (ref 7.350–7.450)

## 2013-05-13 LAB — COMPREHENSIVE METABOLIC PANEL
ALT: 13 U/L (ref 0–53)
AST: 16 U/L (ref 0–37)
Albumin: 2.3 g/dL — ABNORMAL LOW (ref 3.5–5.2)
Alkaline Phosphatase: 47 U/L (ref 39–117)
BUN: 23 mg/dL (ref 6–23)
CALCIUM: 7.8 mg/dL — AB (ref 8.4–10.5)
CO2: 23 mEq/L (ref 19–32)
Chloride: 105 mEq/L (ref 96–112)
Creatinine, Ser: 1.6 mg/dL — ABNORMAL HIGH (ref 0.50–1.35)
GFR calc non Af Amer: 38 mL/min — ABNORMAL LOW (ref >90)
GFR, EST AFRICAN AMERICAN: 44 mL/min — AB (ref >90)
Glucose, Bld: 177 mg/dL — ABNORMAL HIGH (ref 70–99)
Potassium: 3.3 mEq/L — ABNORMAL LOW (ref 3.7–5.3)
Sodium: 139 mEq/L (ref 137–147)
TOTAL PROTEIN: 8 g/dL (ref 6.0–8.3)
Total Bilirubin: 0.8 mg/dL (ref 0.3–1.2)

## 2013-05-13 LAB — MRSA PCR SCREENING: MRSA by PCR: NEGATIVE

## 2013-05-13 LAB — CBC WITH DIFFERENTIAL/PLATELET
Basophils Absolute: 0.1 10*3/uL (ref 0.0–0.1)
Basophils Relative: 1 % (ref 0–1)
EOS ABS: 0.1 10*3/uL (ref 0.0–0.7)
EOS PCT: 1 % (ref 0–5)
HCT: 28.6 % — ABNORMAL LOW (ref 39.0–52.0)
Hemoglobin: 8.9 g/dL — ABNORMAL LOW (ref 13.0–17.0)
Lymphocytes Relative: 45 % (ref 12–46)
Lymphs Abs: 3.5 10*3/uL (ref 0.7–4.0)
MCH: 27.8 pg (ref 26.0–34.0)
MCHC: 31.1 g/dL (ref 30.0–36.0)
MCV: 89.4 fL (ref 78.0–100.0)
MONOS PCT: 6 % (ref 3–12)
Monocytes Absolute: 0.5 10*3/uL (ref 0.1–1.0)
Neutro Abs: 3.6 10*3/uL (ref 1.7–7.7)
Neutrophils Relative %: 47 % (ref 43–77)
PLATELETS: 205 10*3/uL (ref 150–400)
RBC: 3.2 MIL/uL — ABNORMAL LOW (ref 4.22–5.81)
RDW: 18.4 % — ABNORMAL HIGH (ref 11.5–15.5)
WBC: 7.7 10*3/uL (ref 4.0–10.5)

## 2013-05-13 LAB — URINE MICROSCOPIC-ADD ON

## 2013-05-13 LAB — PROTIME-INR
INR: 1.23 (ref 0.00–1.49)
PROTHROMBIN TIME: 15.2 s (ref 11.6–15.2)

## 2013-05-13 LAB — TROPONIN I: Troponin I: <0.3 ng/mL (ref <0.30)

## 2013-05-13 LAB — APTT: aPTT: 29 seconds (ref 24–37)

## 2013-05-13 LAB — I-STAT CG4 LACTIC ACID, ED: Lactic Acid, Venous: 2.16 mmol/L (ref 0.5–2.2)

## 2013-05-13 MED ORDER — ETOMIDATE 2 MG/ML IV SOLN
20.0000 mg | Freq: Once | INTRAVENOUS | Status: AC
  Administered 2013-05-13: 20 mg via INTRAVENOUS

## 2013-05-13 MED ORDER — SODIUM CHLORIDE 0.9 % IV SOLN
1000.0000 mg | Freq: Once | INTRAVENOUS | Status: AC
  Administered 2013-05-13: 1000 mg via INTRAVENOUS
  Filled 2013-05-13: qty 10

## 2013-05-13 MED ORDER — CHLORHEXIDINE GLUCONATE 0.12 % MT SOLN
15.0000 mL | Freq: Two times a day (BID) | OROMUCOSAL | Status: DC
  Administered 2013-05-13 – 2013-05-16 (×5): 15 mL via OROMUCOSAL
  Filled 2013-05-13 (×8): qty 15

## 2013-05-13 MED ORDER — PROPOFOL 10 MG/ML IV EMUL
5.0000 ug/kg/min | INTRAVENOUS | Status: DC
  Administered 2013-05-13 – 2013-05-14 (×2): 40 ug/kg/min via INTRAVENOUS
  Filled 2013-05-13 (×2): qty 100

## 2013-05-13 MED ORDER — SUCCINYLCHOLINE CHLORIDE 20 MG/ML IJ SOLN
INTRAMUSCULAR | Status: AC
  Filled 2013-05-13: qty 1

## 2013-05-13 MED ORDER — ETOMIDATE 2 MG/ML IV SOLN
INTRAVENOUS | Status: AC
  Filled 2013-05-13: qty 20

## 2013-05-13 MED ORDER — SODIUM CHLORIDE 0.9 % IV BOLUS (SEPSIS)
500.0000 mL | Freq: Once | INTRAVENOUS | Status: AC
  Administered 2013-05-13: 500 mL via INTRAVENOUS

## 2013-05-13 MED ORDER — LORAZEPAM 2 MG/ML IJ SOLN
INTRAMUSCULAR | Status: AC
  Filled 2013-05-13: qty 1

## 2013-05-13 MED ORDER — SUCCINYLCHOLINE CHLORIDE 20 MG/ML IJ SOLN
100.0000 mg | Freq: Once | INTRAMUSCULAR | Status: AC
  Administered 2013-05-13: 100 mg via INTRAVENOUS

## 2013-05-13 MED ORDER — PROTHROMBIN COMPLEX CONC HUMAN 500 UNITS IV KIT
50.0000 [IU]/kg | PACK | Status: DC
  Filled 2013-05-13: qty 176

## 2013-05-13 MED ORDER — PROPOFOL 10 MG/ML IV BOLUS
40.0000 mg | Freq: Once | INTRAVENOUS | Status: AC
  Administered 2013-05-13: 40 mg via INTRAVENOUS

## 2013-05-13 MED ORDER — SODIUM CHLORIDE 0.9 % IV SOLN: 250.0000 mL | INTRAVENOUS | Status: DC | PRN

## 2013-05-13 MED ORDER — PANTOPRAZOLE SODIUM 40 MG IV SOLR
40.0000 mg | Freq: Every day | INTRAVENOUS | Status: DC
  Administered 2013-05-13 – 2013-05-16 (×4): 40 mg via INTRAVENOUS
  Filled 2013-05-13 (×4): qty 40

## 2013-05-13 MED ORDER — SODIUM CHLORIDE 0.9 % IV SOLN
INTRAVENOUS | Status: DC
Start: 2013-05-13 — End: 2013-05-16
  Administered 2013-05-15: 18:00:00 via INTRAVENOUS

## 2013-05-13 MED ORDER — PROTHROMBIN COMPLEX CONC HUMAN 500 UNITS IV KIT
4368.0000 [IU] | PACK | INTRAVENOUS | Status: AC
  Administered 2013-05-13: 4368 [IU] via INTRAVENOUS
  Filled 2013-05-13: qty 175

## 2013-05-13 MED ORDER — PROPOFOL 10 MG/ML IV EMUL
INTRAVENOUS | Status: AC
  Administered 2013-05-13: 40 ug/kg/min via INTRAVENOUS
  Filled 2013-05-13: qty 100

## 2013-05-13 MED ORDER — LIDOCAINE HCL (CARDIAC) 20 MG/ML IV SOLN
INTRAVENOUS | Status: AC
  Filled 2013-05-13: qty 5

## 2013-05-13 MED ORDER — ROCURONIUM BROMIDE 50 MG/5ML IV SOLN
INTRAVENOUS | Status: AC
  Filled 2013-05-13: qty 2

## 2013-05-13 MED ORDER — LORAZEPAM 2 MG/ML IJ SOLN
2.0000 mg | Freq: Once | INTRAMUSCULAR | Status: AC
  Administered 2013-05-13: 2 mg via INTRAVENOUS

## 2013-05-13 MED ORDER — PROPOFOL 10 MG/ML IV EMUL
5.0000 ug/kg/min | Freq: Once | INTRAVENOUS | Status: DC
  Administered 2013-05-13: 5 ug/kg/min via INTRAVENOUS

## 2013-05-13 MED ORDER — BIOTENE DRY MOUTH MT LIQD
15.0000 mL | Freq: Four times a day (QID) | OROMUCOSAL | Status: DC
  Administered 2013-05-14 – 2013-05-16 (×13): 15 mL via OROMUCOSAL

## 2013-05-13 MED ORDER — POTASSIUM CHLORIDE 10 MEQ/100ML IV SOLN
10.0000 meq | INTRAVENOUS | Status: AC
  Administered 2013-05-13 – 2013-05-14 (×4): 10 meq via INTRAVENOUS
  Filled 2013-05-13 (×5): qty 100

## 2013-05-13 NOTE — ED Notes (Signed)
Pt with periods of apnea after receiving ativan. EDP informed.

## 2013-05-13 NOTE — Progress Notes (Signed)
Patient transported to [facility]M04 with RN and CNA.  Report given to [facility] RRT.

## 2013-05-13 NOTE — Procedures (Signed)
INTUBATION Date/Time: 05/13/2013 5:46 PM Performed by: Corlis Leak Authorized by: Truddie Hidden Consent: The procedure was performed in an emergent situation. Patient identity confirmed: arm band Indications: airway protection Intubation method: video-assisted Patient status: paralyzed (RSI) Preoxygenation: nonrebreather mask Sedatives: etomidate Paralytic: succinylcholine Tube size: 7.5 mm Tube type: cuffed Number of attempts: 1 Cricoid pressure: yes Cords visualized: yes Post-procedure assessment: chest rise and ETCO2 monitor Breath sounds: equal Cuff inflated: yes ETT to lip: 25 cm Tube secured with: ETT holder Chest x-ray interpreted by me and other physician. Chest x-ray findings: endotracheal tube in appropriate position Patient tolerance: Patient tolerated the procedure well with no immediate complications.

## 2013-05-13 NOTE — ED Notes (Signed)
Provider at the bedside to intubate pt.

## 2013-05-13 NOTE — ED Notes (Signed)
Pt transported to CT with this RN 

## 2013-05-13 NOTE — ED Provider Notes (Signed)
CSN: [id number]     Arrival date & time 05/13/13  1654 History   First MD Initiated Contact with Patient 05/13/13 1702     Chief Complaint  Patient presents with  . Altered Mental Status  . Seizures     (Consider location/radiation/quality/duration/timing/severity/associated sxs/prior Treatment) Patient is a 80 y.o. male presenting with altered mental status and seizures.  Altered Mental Status Associated symptoms: seizures   Seizures  Level 5 caveat due to unresponsive Pt brought to the ED by EMS who report they were called out for patient slumped over unresponsive in wheelchair at home, found by family. En route he had tonic clonic seizure activity, self-limited and no meds given. No known trauma.    Past Medical History  Diagnosis Date  . Glaucoma   . Bradycardia   . Diabetes mellitus   . Hyperlipidemia   . Hypertension   . Shortness of breath     WITH EXERTION  . Stroke     "I had a stroke many yrs ago" -no residual problems  . BPH (benign prostatic hyperplasia)   . Difficulty voiding   . Dysrhythmia     AFIB  - TAKES PRADAXA FOLLOWED BY DR. Gwenlyn Found  . Critical lower limb ischemia   . Melanoma   . Arthritis   . Coronary artery disease    Past Surgical History  Procedure Laterality Date  . Stented coronary artery  2004  . Transurethral resection of prostate  01/24/2012    Procedure: TRANSURETHRAL RESECTION OF THE PROSTATE WITH GYRUS INSTRUMENTS;  Surgeon: Alexis Frock, MD;  Location: [facility];  Service: Urology;  Laterality: N/A;  . Cardiac catheterization  09/19/2002    moderate LAD and RCA and normal LV function  . Nm myocar perf wall motion  05/24/2011    showed inferior scar w/o ischemia  . Doppler echocardiography  05/24/2011    EF >55%  . Doppler echocardiography  09/28/2009    EF >55% lvh w/ left atrial enlargement,diastolic dysfunctio,elevated lv filling pressure  . Nm myocar perf wall motion  09/09/2009    mild ischemia mid anterior,apical anterior and  basal inferolateral  . Nm myocar perf wall motion  12/04/2002  . Nm myocar perf wall motion  05/21/2002  . Cardiac catheterization  07/03/2002  . Cataract extraction    . Melanoma excision Right 10/21/2012    Procedure: MELANOMA EXCISION;  Surgeon: Zenovia Jarred, MD;  Location: [facility];  Service: General;  Laterality: Right;  . Inguinal lymph node biopsy Right 10/21/2012    Procedure: INGUINAL LYMPH NODE BIOPSY;  Surgeon: Zenovia Jarred, MD;  Location: [place] [facility];  Service: General;  Laterality: Right;  . Application of a-cell of extremity Right 10/21/2012    Procedure: APPLICATION OF A-CELL OF EXTREMITY;  Surgeon: Theodoro Kos, DO;  Location: [facility];  Service: Plastics;  Laterality: Right;  . Incision and drainage of wound Right 12/04/2012    Procedure: IRRIGATION AND DEBRIDEMENT RIGHT FOOT WOUND WITH PLACEMENT OF ACELL/VAC;  Surgeon: Theodoro Kos, DO;  Location: [place];  Service: Plastics;  Laterality: Right;  . Esophagogastroduodenoscopy N/A 05/05/2013    Procedure: ESOPHAGOGASTRODUODENOSCOPY (EGD);  Surgeon: Jerene Bears, MD;  Location: [facility];  Service: Gastroenterology;  Laterality: N/A;   Family History  Problem Relation Age of Onset  . Diabetes Mother   . Diabetes Brother    History  Substance Use Topics  . Smoking status: Former Smoker -- 1.50 packs/day    Types: Cigarettes    Quit  date: 01/16/1986  . Smokeless tobacco: Former Systems developer  . Alcohol Use: No    Review of Systems  Neurological: Positive for seizures.   Unable to assess due to mental status.     Allergies  Review of patient's allergies indicates no known allergies.  Home Medications   Prior to Admission medications   Medication Sig Start Date End Date Taking? Authorizing Provider  acetaminophen (TYLENOL) 500 MG tablet Take 1,000 mg by mouth every 6 (six) hours as needed.    Historical Provider, MD  aspirin 81 MG tablet Take 1 tablet (81 mg total) by mouth daily. 05/06/13   Nita Sells, MD  bimatoprost (LUMIGAN) 0.01 % SOLN Place 1 drop into both eyes at bedtime.    Historical Provider, MD  bimatoprost (LUMIGAN) 0.03 % ophthalmic solution 1 drop.    Historical Provider, MD  carvedilol (COREG) 3.125 MG tablet Take 1 tablet (3.125 mg total) by mouth 2 (two) times daily with a meal. 05/06/13   Nita Sells, MD  dabigatran (PRADAXA) 75 MG CAPS capsule Take 1 capsule (75 mg total) by mouth 2 (two) times daily. 05/06/13   Nita Sells, MD  docusate sodium 100 MG CAPS Take 100 mg by mouth 2 (two) times daily. 12/05/12   Shawn Rayburn, PA-C  furosemide (LASIX) 20 MG tablet Take 20 mg by mouth daily.    Historical Provider, MD  linagliptin (TRADJENTA) 5 MG TABS tablet Take 1 tablet (5 mg total) by mouth daily. 01/12/13   Erline Hau, MD  losartan (COZAAR) 25 MG tablet Take 1 tablet (25 mg total) by mouth daily. 01/12/13   Erline Hau, MD  metoprolol tartrate (LOPRESSOR) 25 MG tablet Take 25 mg by mouth.    Historical Provider, MD  oxyCODONE-acetaminophen (PERCOCET/ROXICET) 5-325 MG per tablet Take 1 tablet by mouth. 12/10/12   Historical Provider, MD  pantoprazole (PROTONIX) 40 MG tablet Take 1 tablet (40 mg total) by mouth 2 (two) times daily before a meal. 05/06/13   Nita Sells, MD  potassium chloride SA (K-DUR,KLOR-CON) 20 MEQ tablet  02/10/13   Historical Provider, MD  tamsulosin (FLOMAX) 0.4 MG CAPS capsule Take 0.4 mg by mouth.    Historical Provider, MD  timolol (BETIMOL) 0.25 % ophthalmic solution 1-2 drops.    Historical Provider, MD  traMADol (ULTRAM) 50 MG tablet Take 50 mg by mouth every 6 (six) hours as needed for moderate pain.    Historical Provider, MD   BP 118/52  Pulse 97  Temp(Src) 98.5 F (36.9 C) (Rectal)  Resp 18  SpO2 100% Physical Exam  Nursing note and vitals reviewed. Constitutional: He appears well-developed and well-nourished.  HENT:  Head: Normocephalic and atraumatic.  Eyes:  L eye deviation  Neck:  Neck supple.  Cardiovascular: Normal rate, normal heart sounds and intact distal pulses.   Pulmonary/Chest: Effort normal and breath sounds normal.  Abdominal: Bowel sounds are normal. He exhibits no distension.  Musculoskeletal: Normal range of motion. He exhibits edema (BLE 2+ symmetric). He exhibits no tenderness.  R foot in dressing, boot and wound vac  Neurological:  Unable to fully assess due to mental status, Left eye deviation, tonic movements of face, concerning for continued seizure activity  Skin: Skin is warm and dry. No rash noted.    ED Course  Procedures (including critical care time)  CRITICAL CARE Performed by: Mercie Eon. Kipling Graser Total critical care time: 60 Critical care time was exclusive of separately billable procedures and treating other patients. Critical care  was necessary to treat or prevent imminent or life-threatening deterioration. Critical care was time spent personally by me on the following activities: development of treatment plan with patient and/or surrogate as well as nursing, discussions with consultants, evaluation of patient's response to treatment, examination of patient, obtaining history from patient or surrogate, ordering and performing treatments and interventions, ordering and review of laboratory studies, ordering and review of radiographic studies, pulse oximetry and re-evaluation of patient's condition.   Labs Review Labs Reviewed  CBC WITH DIFFERENTIAL - Abnormal; Notable for the following:    RBC 3.20 (*)    Hemoglobin 8.9 (*)    HCT 28.6 (*)    RDW 18.4 (*)    All other components within normal limits  COMPREHENSIVE METABOLIC PANEL - Abnormal; Notable for the following:    Potassium 3.3 (*)    Glucose, Bld 177 (*)    Creatinine, Ser 1.60 (*)    Calcium 7.8 (*)    Albumin 2.3 (*)    GFR calc non Af Amer 38 (*)    GFR calc Af Amer 44 (*)    All other components within normal limits  I-STAT ARTERIAL BLOOD GAS, ED - Abnormal;  Notable for the following:    pH, Arterial 7.308 (*)    pCO2 arterial 49.1 (*)    pO2, Arterial 469.0 (*)    Bicarbonate 24.6 (*)    All other components within normal limits  TROPONIN I  PROTIME-INR  APTT  URINALYSIS, ROUTINE W REFLEX MICROSCOPIC  I-STAT CG4 LACTIC ACID, ED  TYPE AND SCREEN    Imaging Review Ct Head Wo Contrast  05/13/2013   CLINICAL DATA:  Unresponsive patient with seizure activity.  EXAM: CT HEAD WITHOUT CONTRAST  TECHNIQUE: Contiguous axial images were obtained from the base of the skull through the vertex without intravenous contrast.  COMPARISON:  None.  FINDINGS: Multiple foci of intracranial hemorrhage are present with vasogenic edema. The largest is in the right frontal lobe, measuring 45 mm x 23 mm, with hematocrit layering dependently. Sulcal effacement is present in the right frontal lobe associated with edema and hemorrhage.  There is no midline shift. Another area hemorrhage is present in the periventricular left temporal lobe, with intraventricular break through and blood layering within the occipital horn of the left lateral ventricle. There is thickening of the tentorium suggesting subdural hemorrhage however this may simply represent tentorial calcification.  There is a low-attenuation region in the posterior right frontal lobe without hemorrhage. This could represent old ischemia/infarction or vasogenic edema associated with metastatic disease.  Another low-attenuation lesion is present in the inferior right occipital lobe. There is some hemorrhage layering within the occipital horn of the right lateral ventricle is well. No definite posterior fossa lesions.  The suprasellar cistern remains patent. Dense intracranial atherosclerosis. Endotracheal and orogastric intubation is evident on the lateral view. Paranasal sinuses appear within normal limits. Dense intracranial atherosclerosis.  IMPRESSION: 1. Multifocal intracranial hemorrhage with largest areas of bleeding  in the right frontal and left temporal lobe and blood in the lateral ventricles. 2. Scattered areas of nonhemorrhagic areas of low attenuation suggest vasogenic edema potentially associated with metastatic disease. Patient has a history of melanoma and metastatic melanoma could produce multiple hemorrhagic metastases. Other differential considerations include hemorrhagic infarction, hypertensive hemorrhage and amyloid angiopathy. 3. Followup MRI with the without contrast is recommended to further assess the cause of hemorrhage. 4. Critical Value/emergent results were called by telephone at the time of interpretation on 05/13/2013 at 6:38 PM  to Dr. Calvert Cantor , who verbally acknowledged these results.   Electronically Signed   By: Dereck Ligas M.D.   On: 05/13/2013 18:39   Dg Chest Port 1 View  05/13/2013   CLINICAL DATA:  Post intubation  EXAM: PORTABLE CHEST - 1 VIEW  COMPARISON:  05/13/2013 at 1721 hr  FINDINGS: Endotracheal tube terminates 5 cm above the carina.  Lungs are essentially clear. No focal consolidation. No pleural effusion or pneumothorax.  Cardiomegaly.  Enteric tube terminates in the gastric cardia.  IMPRESSION: Endotracheal tube terminates 5 cm above the carina.   Electronically Signed   By: Julian Hy M.D.   On: 05/13/2013 18:05   Dg Chest Port 1 View  05/13/2013   CLINICAL DATA:  Seizure.  EXAM: PORTABLE CHEST - 1 VIEW  COMPARISON:  DG CHEST 2 VIEW dated 05/02/2013  FINDINGS: Cardiomegaly. Tortuous thoracic aorta. Aortic arch atherosclerosis. Monitoring leads project over the chest. Mild basilar atelectasis is present. No airspace disease. No effusion. Resection of the distal right clavicle.  IMPRESSION: Cardiomegaly and low lung volumes.  No acute abnormality.   Electronically Signed   By: Dereck Ligas M.D.   On: 05/13/2013 17:47     EKG Interpretation   Date/Time:  Tuesday May 13 2013 17:05:44 EDT Ventricular Rate:  111 PR Interval:    QRS Duration: 101 QT  Interval:  341 QTC Calculation: 463 R Axis:   -63 Text Interpretation:  Atrial fibrillation Left anterior fascicular block  Abnormal R-wave progression, early transition Probable left ventricular  hypertrophy Nonspecific T abnormalities, lateral leads No significant  change since last tracing Confirmed by Terasa Orsini  MD, Juanda Crumble [phone]) on  05/13/2013 5:10:53 PM      MDM   Final diagnoses:  Intracranial hemorrhage  Metastatic melanoma  Respiratory failure  Seizure  Coagulopathy    Pt given ativan initially for continued seizure activity. Became sonorous a short time later with reported periods of apnea. Intubated by Dr. Fredric Dine for airway protection, I was present during intubation. Pt is afebrile, doubt sepsis as a cause of his symptoms. On previous admissions he has been Full Code, but no family available here at this time.   7:32 PM CT images reviewed with Radiology and Neurology. Suspect this is metastatic disease from known melanoma. Pt also on Pradaxa, Kcentra protocol initiated. Discussed with PCCM who will evaluate for admission. Brother at bedside now, informed of CT findings and likely poor overall prognosis.   Makya Yurko B. Karle Starch, MD 05/13/13 4332

## 2013-05-13 NOTE — ED Notes (Signed)
Pt's brother took all belongings home.

## 2013-05-13 NOTE — H&P (Addendum)
PULMONARY / CRITICAL CARE MEDICINE   Name: Craig Martin MRN: [id number] DOB: 1926/06/21    ADMISSION DATE:  05/13/2013 CONSULTATION DATE:  05/13/2013  REFERRING MD :  EDP PRIMARY SERVICE: PCCM  CHIEF COMPLAINT:  Unresponsive  BRIEF PATIENT DESCRIPTION: 80 year old male with PMH of metastatic melanoma who was last seen normal at noon on day of presentation.  Family found him slumped over in the wheel chair and felt that patient may have had a seizure.  Found vomit on his shirt.  EMS was called and en route had a tonic clonic seizure that was self limiting.  No trauma history.  In the ED was found to be apneic and was intubated by EDP.  Neurology saw patient and PCCM was called to admit.  SIGNIFICANT EVENTS / STUDIES:  5/12 intubated in ED.  LINES / TUBES: ETT 5/12>>> PIV  CULTURES: None  ANTIBIOTICS: None  PAST MEDICAL HISTORY :  Past Medical History  Diagnosis Date  . Glaucoma   . Bradycardia   . Diabetes mellitus   . Hyperlipidemia   . Hypertension   . Shortness of breath     WITH EXERTION  . Stroke     "I had a stroke many yrs ago" -no residual problems  . BPH (benign prostatic hyperplasia)   . Difficulty voiding   . Dysrhythmia     AFIB  - TAKES PRADAXA FOLLOWED BY DR. Gwenlyn Found  . Critical lower limb ischemia   . Melanoma   . Arthritis   . Coronary artery disease    Past Surgical History  Procedure Laterality Date  . Stented coronary artery  2004  . Transurethral resection of prostate  01/24/2012    Procedure: TRANSURETHRAL RESECTION OF THE PROSTATE WITH GYRUS INSTRUMENTS;  Surgeon: Alexis Frock, MD;  Location: [facility];  Service: Urology;  Laterality: N/A;  . Cardiac catheterization  09/19/2002    moderate LAD and RCA and normal LV function  . Nm myocar perf wall motion  05/24/2011    showed inferior scar w/o ischemia  . Doppler echocardiography  05/24/2011    EF >55%  . Doppler echocardiography  09/28/2009    EF >55% lvh w/ left atrial  enlargement,diastolic dysfunctio,elevated lv filling pressure  . Nm myocar perf wall motion  09/09/2009    mild ischemia mid anterior,apical anterior and basal inferolateral  . Nm myocar perf wall motion  12/04/2002  . Nm myocar perf wall motion  05/21/2002  . Cardiac catheterization  07/03/2002  . Cataract extraction    . Melanoma excision Right 10/21/2012    Procedure: MELANOMA EXCISION;  Surgeon: Zenovia Jarred, MD;  Location: [place];  Service: General;  Laterality: Right;  . Inguinal lymph node biopsy Right 10/21/2012    Procedure: INGUINAL LYMPH NODE BIOPSY;  Surgeon: Zenovia Jarred, MD;  Location: [place];  Service: General;  Laterality: Right;  . Application of a-cell of extremity Right 10/21/2012    Procedure: APPLICATION OF A-CELL OF EXTREMITY;  Surgeon: Theodoro Kos, DO;  Location: [place];  Service: Plastics;  Laterality: Right;  . Incision and drainage of wound Right 12/04/2012    Procedure: IRRIGATION AND DEBRIDEMENT RIGHT FOOT WOUND WITH PLACEMENT OF ACELL/VAC;  Surgeon: Theodoro Kos, DO;  Location: [place];  Service: Plastics;  Laterality: Right;  . Esophagogastroduodenoscopy N/A 05/05/2013    Procedure: ESOPHAGOGASTRODUODENOSCOPY (EGD);  Surgeon: Jerene Bears, MD;  Location: [place];  Service: Gastroenterology;  Laterality: N/A;   Prior to Admission medications  Medication Sig Start Date End Date Taking? Authorizing Provider  aspirin EC 81 MG tablet Take 81 mg by mouth daily.   Yes Historical Provider, MD  dabigatran (PRADAXA) 75 MG CAPS capsule Take 1 capsule (75 mg total) by mouth 2 (two) times daily. 05/06/13  Yes Nita Sells, MD  furosemide (LASIX) 20 MG tablet Take 20 mg by mouth daily.   Yes Historical Provider, MD  linagliptin (TRADJENTA) 5 MG TABS tablet Take 1 tablet (5 mg total) by mouth daily. 01/12/13  Yes Estela Leonie Green, MD  losartan (COZAAR) 25 MG tablet Take 1 tablet (25 mg total) by mouth daily. 01/12/13  Yes Estela Leonie Green, MD  pantoprazole (PROTONIX) 40 MG tablet Take 1 tablet (40 mg total) by mouth 2 (two) times daily before a meal. 05/06/13  Yes Nita Sells, MD  acetaminophen (TYLENOL) 500 MG tablet Take 1,000 mg by mouth every 6 (six) hours as needed.    Historical Provider, MD  bimatoprost (LUMIGAN) 0.01 % SOLN Place 1 drop into both eyes at bedtime.    Historical Provider, MD  bimatoprost (LUMIGAN) 0.03 % ophthalmic solution 1 drop.    Historical Provider, MD  carvedilol (COREG) 3.125 MG tablet Take 1 tablet (3.125 mg total) by mouth 2 (two) times daily with a meal. 05/06/13   Nita Sells, MD  docusate sodium 100 MG CAPS Take 100 mg by mouth 2 (two) times daily. 12/05/12   Shawn Rayburn, PA-C  metoprolol tartrate (LOPRESSOR) 25 MG tablet Take 25 mg by mouth.    Historical Provider, MD  oxyCODONE-acetaminophen (PERCOCET/ROXICET) 5-325 MG per tablet Take 1 tablet by mouth. 12/10/12   Historical Provider, MD  potassium chloride SA (K-DUR,KLOR-CON) 20 MEQ tablet  02/10/13   Historical Provider, MD  tamsulosin (FLOMAX) 0.4 MG CAPS capsule Take 0.4 mg by mouth.    Historical Provider, MD  timolol (BETIMOL) 0.25 % ophthalmic solution 1-2 drops.    Historical Provider, MD  traMADol (ULTRAM) 50 MG tablet Take 50 mg by mouth every 6 (six) hours as needed for moderate pain.    Historical Provider, MD   No Known Allergies  FAMILY HISTORY:  Family History  Problem Relation Age of Onset  . Diabetes Mother   . Diabetes Brother    SOCIAL HISTORY:  reports that he quit smoking about 27 years ago. His smoking use included Cigarettes. He smoked 1.50 packs per day. He has quit using smokeless tobacco. He reports that he does not drink alcohol or use illicit drugs.  REVIEW OF SYSTEMS:  Unattainable, unresponsive and intubated.  SUBJECTIVE: Intubated and unresponsive.  VITAL SIGNS: Temp:  [98.5 F (36.9 C)] 98.5 F (36.9 C) (05/12 1707) Pulse Rate:  [85-121] 85 (05/12 1830) Resp:  [18-30] 19  (05/12 1830) BP: (114-149)/(52-100) 114/73 mmHg (05/12 1830) SpO2:  [100 %] 100 % (05/12 1830) FiO2 (%):  [40 %-100 %] 40 % (05/12 1823) Weight:  [194 lb 0.1 oz (88 kg)] 194 lb 0.1 oz (88 kg) (05/12 1823) HEMODYNAMICS:   VENTILATOR SETTINGS: Vent Mode:  [-]  FiO2 (%):  [40 %-100 %] 40 % INTAKE / OUTPUT: Intake/Output   None     PHYSICAL EXAMINATION: General:  Chronically ill appearing male, intubated and unresponsive. Neuro:  Unresponsive to pain or verbal stimuli, flaccid ext, weak gag, no corneal or doll eye response. HEENT:  [place]/AT, PERRL, no EOM, no dolls eye, minimal gag, DMM. Cardiovascular:  IRIR, Nl S1/S2, -M/R/G. Lungs:  Coarse BS diffusely. Abdomen:  Soft, NT, ND  and +BS. Musculoskeletal:  -edema and -tenderness. Skin:  Intact.  LABS:  CBC  Recent Labs Lab 05/13/13 1715  WBC 7.7  HGB 8.9*  HCT 28.6*  PLT 205   Coag's  Recent Labs Lab 05/13/13 1715  APTT 29  INR 1.23   BMET  Recent Labs Lab 05/13/13 1715  NA 139  K 3.3*  CL 105  CO2 23  BUN 23  CREATININE 1.60*  GLUCOSE 177*   Electrolytes  Recent Labs Lab 05/13/13 1715  CALCIUM 7.8*   Sepsis Markers  Recent Labs Lab 05/13/13 1728  LATICACIDVEN 2.16   ABG  Recent Labs Lab 05/13/13 1844  PHART 7.308*  PCO2ART 49.1*  PO2ART 469.0*   Liver Enzymes  Recent Labs Lab 05/13/13 1715  AST 16  ALT 13  ALKPHOS 47  BILITOT 0.8  ALBUMIN 2.3*   Cardiac Enzymes  Recent Labs Lab 05/13/13 1715  TROPONINI <0.30   Glucose  Recent Labs Lab 05/12/13 1032  GLUCAP 96    Imaging Ct Head Wo Contrast  05/13/2013   CLINICAL DATA:  Unresponsive patient with seizure activity.  EXAM: CT HEAD WITHOUT CONTRAST  TECHNIQUE: Contiguous axial images were obtained from the base of the skull through the vertex without intravenous contrast.  COMPARISON:  None.  FINDINGS: Multiple foci of intracranial hemorrhage are present with vasogenic edema. The largest is in the right frontal lobe,  measuring 45 mm x 23 mm, with hematocrit layering dependently. Sulcal effacement is present in the right frontal lobe associated with edema and hemorrhage.  There is no midline shift. Another area hemorrhage is present in the periventricular left temporal lobe, with intraventricular break through and blood layering within the occipital horn of the left lateral ventricle. There is thickening of the tentorium suggesting subdural hemorrhage however this may simply represent tentorial calcification.  There is a low-attenuation region in the posterior right frontal lobe without hemorrhage. This could represent old ischemia/infarction or vasogenic edema associated with metastatic disease.  Another low-attenuation lesion is present in the inferior right occipital lobe. There is some hemorrhage layering within the occipital horn of the right lateral ventricle is well. No definite posterior fossa lesions.  The suprasellar cistern remains patent. Dense intracranial atherosclerosis. Endotracheal and orogastric intubation is evident on the lateral view. Paranasal sinuses appear within normal limits. Dense intracranial atherosclerosis.  IMPRESSION: 1. Multifocal intracranial hemorrhage with largest areas of bleeding in the right frontal and left temporal lobe and blood in the lateral ventricles. 2. Scattered areas of nonhemorrhagic areas of low attenuation suggest vasogenic edema potentially associated with metastatic disease. Patient has a history of melanoma and metastatic melanoma could produce multiple hemorrhagic metastases. Other differential considerations include hemorrhagic infarction, hypertensive hemorrhage and amyloid angiopathy. 3. Followup MRI with the without contrast is recommended to further assess the cause of hemorrhage. 4. Critical Value/emergent results were called by telephone at the time of interpretation on 05/13/2013 at 6:38 PM to Dr. Calvert Cantor , who verbally acknowledged these results.    Electronically Signed   By: Dereck Ligas M.D.   On: 05/13/2013 18:39   Dg Chest Port 1 View  05/13/2013   CLINICAL DATA:  Post intubation  EXAM: PORTABLE CHEST - 1 VIEW  COMPARISON:  05/13/2013 at 1721 hr  FINDINGS: Endotracheal tube terminates 5 cm above the carina.  Lungs are essentially clear. No focal consolidation. No pleural effusion or pneumothorax.  Cardiomegaly.  Enteric tube terminates in the gastric cardia.  IMPRESSION: Endotracheal tube terminates 5 cm above the  carina.   Electronically Signed   By: Julian Hy M.D.   On: 05/13/2013 18:05   Dg Chest Port 1 View  05/13/2013   CLINICAL DATA:  Seizure.  EXAM: PORTABLE CHEST - 1 VIEW  COMPARISON:  DG CHEST 2 VIEW dated 05/02/2013  FINDINGS: Cardiomegaly. Tortuous thoracic aorta. Aortic arch atherosclerosis. Monitoring leads project over the chest. Mild basilar atelectasis is present. No airspace disease. No effusion. Resection of the distal right clavicle.  IMPRESSION: Cardiomegaly and low lung volumes.  No acute abnormality.   Electronically Signed   By: Dereck Ligas M.D.   On: 05/13/2013 17:47     CXR: Cardiomegally, ETT ok.  ASSESSMENT / PLAN:  PULMONARY A: VDRF due to neuro event. P:   - Full vent support. - ABG now. - CXR and ABG in AM. - Adjust vent to ABG.  CARDIOVASCULAR A: A-fib on pradaxa. P:  - Hold anti-coag. - Reversal of anti-coag. - BP control per neuro.  RENAL A:  Cr 1.6 and hypokalemia. P:   - Replete electrolytes as indicated. - IVF as ordered. - Monitor renal function.  GASTROINTESTINAL A:  No active issues. P:   - NPO. - OGT. - Start TF in AM.  HEMATOLOGIC A:  Likely metastatic melanoma.  Pradaxa for a-fib. P:  - Reverse pradaxa as ordered. - CBC daily. - Transfuse for Hg of 7.  INFECTIOUS A:  No active issues. P:   - Vomited so monitor for aspiration. - Monitor WBC and fever curve.  ENDOCRINE A:  DM by history.   P:   - ISS.  NEUROLOGIC A:  Multiple sites of ICH.   Likely metastatic disease. P:   - Oncology called. - Keppra. - Decadron. - Revere anticoag. - CT in AM. - Further recommendations per neuro.  TODAY'S SUMMARY: 80 year old with metastatic melanoma, presenting with multiple sites of ICH and other metastatic areas in the brain.  At this point, prognosis is very poor.  Will consult neuro.  Hold sedation for now.  Admit to the ICU and monitor condition.  No family present to discuss limitation of care.  I have personally obtained a history, examined the patient, evaluated laboratory and imaging results, formulated the assessment and plan and placed orders.  CRITICAL CARE: The patient is critically ill with multiple organ systems failure and requires high complexity decision making for assessment and support, frequent evaluation and titration of therapies, application of advanced monitoring technologies and extensive interpretation of multiple databases. Critical Care Time devoted to patient care services described in this note is 45 minutes.   Rush Farmer, M.D. [facility] Pulmonary/Critical Care Medicine. Pager: [phone]. After hours pager: [phone].

## 2013-05-13 NOTE — Progress Notes (Signed)
[facility]  NAME:  Craig Martin, Craig Martin NO.:  [id number]  MEDICAL RECORD NO.:  [id number]      DATE OF BIRTH:  April 24, 1926  PHYSICIAN:  Theodoro Kos, DO       VISIT DATE:  05/12/2013                                  OFFICE VISIT   HISTORY:  The patient is an 80 year old gentleman who is here for followup on his right leg lower extremity foot ulcer.  He underwent excision of a melanoma with ACell placement and it is filling in very well.  He still does not want to undergo any surgery at this time, mostly because he is taking care of his family member.  There has been no change in his medication or social history other than the fact that he was admitted for anemia and underwent transfusion.  He is home again and doing quite well.  PHYSICAL EXAMINATION:  GENERAL:  He is alert, oriented, not in any acute distress. HEENT:  His pupils are equal. RESPIRATORY:  His breathing is unlabored.  All his wounds are looking better.  They do not appear infected.  He is filling them in nicely and they are getting smaller.  An Endoform was placed with the VAC sponge, Adaptic between the two, and we will see him back in 3 weeks.  He is to continue with VAC changes 3 times a week.     Theodoro Kos, DO     CS/MEDQ  D:  05/12/2013  T:  05/13/2013  Job:  [id number]

## 2013-05-13 NOTE — ED Notes (Signed)
Pt to department via EMS- family reports that he had PT today and was normal at 1200 this afternoon. Family that pt had  unwitnessed seizure activity and found him with vomit on his shirt. Pt has been unresponsive with EMS in route. Bp-140/90 CBG-160 Hr-100. Arrived on a NRB. 20g left forearm.

## 2013-05-13 NOTE — Consult Note (Signed)
Reason for Consult:Seizure Referring Physician: Karle Starch  CC: Seizure  HPI: Craig Martin is an 80 y.o. male family reports that he had PT today and was normal at 1200 this afternoon. Family found patient later slumped over in his wheelchair.  Was felt to have had unwitnessed seizure activity.  Was found with vomit on his shirt.  EMS was called.  En route he had tonic clonic seizure activity, self-limited and no meds given. No known trauma. Bp-140/90 CBG-160 Hr-100.  Patient given Ativan in ED and felt to have periods of apnea afterward.  Patient was then intubated.     Past Medical History  Diagnosis Date  . Glaucoma   . Bradycardia   . Diabetes mellitus   . Hyperlipidemia   . Hypertension   . Shortness of breath     WITH EXERTION  . Stroke     "I had a stroke many yrs ago" -no residual problems  . BPH (benign prostatic hyperplasia)   . Difficulty voiding   . Dysrhythmia     AFIB  - TAKES PRADAXA FOLLOWED BY DR. Gwenlyn Found  . Critical lower limb ischemia   . Melanoma   . Arthritis   . Coronary artery disease     Past Surgical History  Procedure Laterality Date  . Stented coronary artery  2004  . Transurethral resection of prostate  01/24/2012    Procedure: TRANSURETHRAL RESECTION OF THE PROSTATE WITH GYRUS INSTRUMENTS;  Surgeon: Alexis Frock, MD;  Location: [facility];  Service: Urology;  Laterality: N/A;  . Cardiac catheterization  09/19/2002    moderate LAD and RCA and normal LV function  . Nm myocar perf wall motion  05/24/2011    showed inferior scar w/o ischemia  . Doppler echocardiography  05/24/2011    EF >55%  . Doppler echocardiography  09/28/2009    EF >55% lvh w/ left atrial enlargement,diastolic dysfunctio,elevated lv filling pressure  . Nm myocar perf wall motion  09/09/2009    mild ischemia mid anterior,apical anterior and basal inferolateral  . Nm myocar perf wall motion  12/04/2002  . Nm myocar perf wall motion  05/21/2002  . Cardiac catheterization  07/03/2002   . Cataract extraction    . Melanoma excision Right 10/21/2012    Procedure: MELANOMA EXCISION;  Surgeon: Zenovia Jarred, MD;  Location: [place];  Service: General;  Laterality: Right;  . Inguinal lymph node biopsy Right 10/21/2012    Procedure: INGUINAL LYMPH NODE BIOPSY;  Surgeon: Zenovia Jarred, MD;  Location: [place];  Service: General;  Laterality: Right;  . Application of a-cell of extremity Right 10/21/2012    Procedure: APPLICATION OF A-CELL OF EXTREMITY;  Surgeon: Theodoro Kos, DO;  Location: [place];  Service: Plastics;  Laterality: Right;  . Incision and drainage of wound Right 12/04/2012    Procedure: IRRIGATION AND DEBRIDEMENT RIGHT FOOT WOUND WITH PLACEMENT OF ACELL/VAC;  Surgeon: Theodoro Kos, DO;  Location: [place];  Service: Plastics;  Laterality: Right;  . Esophagogastroduodenoscopy N/A 05/05/2013    Procedure: ESOPHAGOGASTRODUODENOSCOPY (EGD);  Surgeon: Jerene Bears, MD;  Location: [place];  Service: Gastroenterology;  Laterality: N/A;    Family History  Problem Relation Age of Onset  . Diabetes Mother   . Diabetes Brother     Social History:  reports that he quit smoking about 27 years ago. His smoking use included Cigarettes. He smoked 1.50 packs per day. He has quit using smokeless tobacco. He reports that he does not drink alcohol or use  illicit drugs.  No Known Allergies  Medications: I have reviewed the patient's current medications. Prior to Admission:  Current outpatient prescriptions:aspirin EC 81 MG tablet, Take 81 mg by mouth daily., Disp: , Rfl: ;  dabigatran (PRADAXA) 75 MG CAPS capsule, Take 1 capsule (75 mg total) by mouth 2 (two) times daily., Disp: 60 capsule, Rfl: 1;  furosemide (LASIX) 20 MG tablet, Take 20 mg by mouth daily., Disp: , Rfl: ;  linagliptin (TRADJENTA) 5 MG TABS tablet, Take 1 tablet (5 mg total) by mouth daily., Disp: 30 tablet, Rfl: 1 losartan (COZAAR) 25 MG tablet, Take 1 tablet (25 mg total) by mouth daily., Disp:  30 tablet, Rfl: 1;  pantoprazole (PROTONIX) 40 MG tablet, Take 1 tablet (40 mg total) by mouth 2 (two) times daily before a meal., Disp: 60 tablet, Rfl: 0;  acetaminophen (TYLENOL) 500 MG tablet, Take 1,000 mg by mouth every 6 (six) hours as needed., Disp: , Rfl: ;  bimatoprost (LUMIGAN) 0.01 % SOLN, Place 1 drop into both eyes at bedtime., Disp: , Rfl:  bimatoprost (LUMIGAN) 0.03 % ophthalmic solution, 1 drop., Disp: , Rfl: ;  carvedilol (COREG) 3.125 MG tablet, Take 1 tablet (3.125 mg total) by mouth 2 (two) times daily with a meal., Disp: 60 tablet, Rfl: 0;  docusate sodium 100 MG CAPS, Take 100 mg by mouth 2 (two) times daily., Disp: 10 capsule, Rfl: 0;  metoprolol tartrate (LOPRESSOR) 25 MG tablet, Take 25 mg by mouth., Disp: , Rfl:  oxyCODONE-acetaminophen (PERCOCET/ROXICET) 5-325 MG per tablet, Take 1 tablet by mouth., Disp: , Rfl: ;  potassium chloride SA (K-DUR,KLOR-CON) 20 MEQ tablet, , Disp: , Rfl: ;  tamsulosin (FLOMAX) 0.4 MG CAPS capsule, Take 0.4 mg by mouth., Disp: , Rfl: ;  timolol (BETIMOL) 0.25 % ophthalmic solution, 1-2 drops., Disp: , Rfl: ;  traMADol (ULTRAM) 50 MG tablet, Take 50 mg by mouth every 6 (six) hours as needed for moderate pain., Disp: , Rfl:   ROS: Unable to obtain  Physical Examination: Blood pressure 114/73, pulse 85, temperature 98.5 F (36.9 C), temperature source Rectal, resp. rate 19, weight 88 kg (194 lb 0.1 oz), SpO2 100.00%.  Neurologic Examination Mental Status: Patient does not respond to verbal stimuli.  Does not respond to deep sternal rub.  Does not follow commands.  No verbalizations noted.  Cranial Nerves: II: patient does not respond confrontation bilaterally, pupils right 3 mm, left 3 mm,and reactive bilaterally III,IV,VI: doll's response absent bilaterally.  V,VII: corneal reflex absent bilaterally  VIII: patient does not respond to verbal stimuli IX,X: gag reflex reduced, XI: trapezius strength unable to test bilaterally XII: tongue strength  unable to test Motor: Extremities flaccid throughout.  No spontaneous movement noted.  No purposeful movements noted. Sensory: Does not respond to noxious stimuli in any extremity. Deep Tendon Reflexes:  2+ throughout with absent AJ's bilaterally Plantars: mute bilaterally Cerebellar: Unable to perform    Laboratory Studies:   Basic Metabolic Panel:  Recent Labs Lab 05/13/13 1715  NA 139  K 3.3*  CL 105  CO2 23  GLUCOSE 177*  BUN 23  CREATININE 1.60*  CALCIUM 7.8*    Liver Function Tests:  Recent Labs Lab 05/13/13 1715  AST 16  ALT 13  ALKPHOS 47  BILITOT 0.8  PROT 8.0  ALBUMIN 2.3*   No results found for this basename: LIPASE, AMYLASE,  in the last 168 hours No results found for this basename: AMMONIA,  in the last 168 hours  CBC:  Recent  Labs Lab 05/13/13 1715  WBC 7.7  NEUTROABS 3.6  HGB 8.9*  HCT 28.6*  MCV 89.4  PLT 205    Cardiac Enzymes:  Recent Labs Lab 05/13/13 1715  TROPONINI <0.30    BNP: No components found with this basename: POCBNP,   CBG:  Recent Labs Lab 05/12/13 1032  GLUCAP 96    Microbiology: Results for orders placed during the hospital encounter of 01/17/12  SURGICAL PCR SCREEN     Status: Abnormal   Collection Time    01/17/12 12:58 PM      Result Value Ref Range Status   MRSA, PCR NEGATIVE  NEGATIVE Final   Staphylococcus aureus POSITIVE (*) NEGATIVE Final   Comment:            The Xpert SA Assay (FDA     approved for NASAL specimens     in patients over 1 years of age),     is one component of     a comprehensive surveillance     program.  Test performance has     been validated by [employer] for patients greater     than or equal to 63 year old.     It is not intended     to diagnose infection nor to     guide or monitor treatment.    Coagulation Studies:  Recent Labs  05/13/13 1715  LABPROT 15.2  INR 1.23    Urinalysis: No results found for this basename: COLORURINE,  APPERANCEUR, LABSPEC, PHURINE, GLUCOSEU, HGBUR, BILIRUBINUR, KETONESUR, PROTEINUR, UROBILINOGEN, NITRITE, LEUKOCYTESUR,  in the last 168 hours  Lipid Panel:  No results found for this basename: chol, trig, hdl, cholhdl, vldl, ldlcalc    HgbA1C:  Lab Results  Component Value Date   HGBA1C 7.3* 07/27/2010    Urine Drug Screen:   No results found for this basename: labopia, cocainscrnur, labbenz, amphetmu, thcu, labbarb    Alcohol Level: No results found for this basename: ETH,  in the last 168 hours  Other results: EKG: atrial fibrillation, rate 111.  Imaging: Ct Head Wo Contrast  05/13/2013   CLINICAL DATA:  Unresponsive patient with seizure activity.  EXAM: CT HEAD WITHOUT CONTRAST  TECHNIQUE: Contiguous axial images were obtained from the base of the skull through the vertex without intravenous contrast.  COMPARISON:  None.  FINDINGS: Multiple foci of intracranial hemorrhage are present with vasogenic edema. The largest is in the right frontal lobe, measuring 45 mm x 23 mm, with hematocrit layering dependently. Sulcal effacement is present in the right frontal lobe associated with edema and hemorrhage.  There is no midline shift. Another area hemorrhage is present in the periventricular left temporal lobe, with intraventricular break through and blood layering within the occipital horn of the left lateral ventricle. There is thickening of the tentorium suggesting subdural hemorrhage however this may simply represent tentorial calcification.  There is a low-attenuation region in the posterior right frontal lobe without hemorrhage. This could represent old ischemia/infarction or vasogenic edema associated with metastatic disease.  Another low-attenuation lesion is present in the inferior right occipital lobe. There is some hemorrhage layering within the occipital horn of the right lateral ventricle is well. No definite posterior fossa lesions.  The suprasellar cistern remains patent. Dense  intracranial atherosclerosis. Endotracheal and orogastric intubation is evident on the lateral view. Paranasal sinuses appear within normal limits. Dense intracranial atherosclerosis.  IMPRESSION: 1. Multifocal intracranial hemorrhage with largest areas of bleeding in the right frontal  and left temporal lobe and blood in the lateral ventricles. 2. Scattered areas of nonhemorrhagic areas of low attenuation suggest vasogenic edema potentially associated with metastatic disease. Patient has a history of melanoma and metastatic melanoma could produce multiple hemorrhagic metastases. Other differential considerations include hemorrhagic infarction, hypertensive hemorrhage and amyloid angiopathy. 3. Followup MRI with the without contrast is recommended to further assess the cause of hemorrhage. 4. Critical Value/emergent results were called by telephone at the time of interpretation on 05/13/2013 at 6:38 PM to Dr. Calvert Cantor , who verbally acknowledged these results.   Electronically Signed   By: Dereck Ligas M.D.   On: 05/13/2013 18:39   Dg Chest Port 1 View  05/13/2013   CLINICAL DATA:  Post intubation  EXAM: PORTABLE CHEST - 1 VIEW  COMPARISON:  05/13/2013 at 1721 hr  FINDINGS: Endotracheal tube terminates 5 cm above the carina.  Lungs are essentially clear. No focal consolidation. No pleural effusion or pneumothorax.  Cardiomegaly.  Enteric tube terminates in the gastric cardia.  IMPRESSION: Endotracheal tube terminates 5 cm above the carina.   Electronically Signed   By: Julian Hy M.D.   On: 05/13/2013 18:05   Dg Chest Port 1 View  05/13/2013   CLINICAL DATA:  Seizure.  EXAM: PORTABLE CHEST - 1 VIEW  COMPARISON:  DG CHEST 2 VIEW dated 05/02/2013  FINDINGS: Cardiomegaly. Tortuous thoracic aorta. Aortic arch atherosclerosis. Monitoring leads project over the chest. Mild basilar atelectasis is present. No airspace disease. No effusion. Resection of the distal right clavicle.  IMPRESSION: Cardiomegaly  and low lung volumes.  No acute abnormality.   Electronically Signed   By: Dereck Ligas M.D.   On: 05/13/2013 17:47     Assessment/Plan: 80 year old male found unresponsive, then noted to have seizure activity en route.  Head CT reviewed and shows multifocal intracranial hemorrhage with largest areas of bleeding in the right frontal and left temporal lobe.  There is also blood in the lateral ventricles.  There are scattered areas of nonhemorrhagic areas of low attenuation suggesting  vasogenic edema.  Metastatic disease is high on the differential.  Patient with a history of melanoma.  Patient on Pradaxa as well.    Recommendations: 1.  Anticoagulation reversal 2.  Decadron 6mg  q6 for edema 3.  MRI of the brain with and without contrast 4.  Oncology consult 5.  Keppra 1000mg  IV now with a maintenance of 500mg  IV q 12hours.   6.  Seizure precautions 7.  Ativan prn 8.  Repeat imaging in AM  This patient is critically ill and at significant risk of neurological worsening, death and care requires constant monitoring of vital signs, hemodynamics,respiratory and cardiac monitoring, neurological assessment, discussion with family, other specialists and medical decision making of high complexity. I spent 45 minutes of neurocritical care time  in the care of  this patient.    Alexis Goodell, MD [facility] [phone] 05/13/2013, 6:52 PM

## 2013-05-13 NOTE — ED Notes (Signed)
Requested Keppra from pharmacy

## 2013-05-13 NOTE — ED Notes (Signed)
Family at the bedside. EDP informed.

## 2013-05-14 ENCOUNTER — Inpatient Hospital Stay (HOSPITAL_COMMUNITY): Payer: Medicare Other

## 2013-05-14 DIAGNOSIS — D649 Anemia, unspecified: Secondary | ICD-10-CM

## 2013-05-14 LAB — BASIC METABOLIC PANEL
BUN: 22 mg/dL (ref 6–23)
CHLORIDE: 106 meq/L (ref 96–112)
CO2: 19 meq/L (ref 19–32)
CREATININE: 1.44 mg/dL — AB (ref 0.50–1.35)
Calcium: 7.9 mg/dL — ABNORMAL LOW (ref 8.4–10.5)
GFR calc Af Amer: 50 mL/min — ABNORMAL LOW (ref >90)
GFR calc non Af Amer: 43 mL/min — ABNORMAL LOW (ref >90)
Glucose, Bld: 101 mg/dL — ABNORMAL HIGH (ref 70–99)
Potassium: 3.8 mEq/L (ref 3.7–5.3)
Sodium: 137 mEq/L (ref 137–147)

## 2013-05-14 LAB — BLOOD GAS, ARTERIAL
Acid-base deficit: 1.5 mmol/L (ref 0.0–2.0)
Bicarbonate: 21.6 mEq/L (ref 20.0–24.0)
DRAWN BY: 36277
FIO2: 0.4 %
LHR: 18 {breaths}/min
O2 Saturation: 99.5 %
PCO2 ART: 28.5 mmHg — AB (ref 35.0–45.0)
PEEP: 5 cmH2O
PH ART: 7.488 — AB (ref 7.350–7.450)
Patient temperature: 97.5
TCO2: 22.5 mmol/L (ref 0–100)
VT: 530 mL
pO2, Arterial: 168 mmHg — ABNORMAL HIGH (ref 80.0–100.0)

## 2013-05-14 LAB — CBC
HEMATOCRIT: 28 % — AB (ref 39.0–52.0)
Hemoglobin: 8.8 g/dL — ABNORMAL LOW (ref 13.0–17.0)
MCH: 27.9 pg (ref 26.0–34.0)
MCHC: 31.4 g/dL (ref 30.0–36.0)
MCV: 88.9 fL (ref 78.0–100.0)
Platelets: 176 10*3/uL (ref 150–400)
RBC: 3.15 MIL/uL — AB (ref 4.22–5.81)
RDW: 18.4 % — ABNORMAL HIGH (ref 11.5–15.5)
WBC: 6.4 10*3/uL (ref 4.0–10.5)

## 2013-05-14 LAB — PHOSPHORUS: Phosphorus: 3.1 mg/dL (ref 2.3–4.6)

## 2013-05-14 LAB — MAGNESIUM: MAGNESIUM: 1.5 mg/dL (ref 1.5–2.5)

## 2013-05-14 LAB — GLUCOSE, CAPILLARY
GLUCOSE-CAPILLARY: 95 mg/dL (ref 70–99)
Glucose-Capillary: 104 mg/dL — ABNORMAL HIGH (ref 70–99)
Glucose-Capillary: 134 mg/dL — ABNORMAL HIGH (ref 70–99)
Glucose-Capillary: 144 mg/dL — ABNORMAL HIGH (ref 70–99)
Glucose-Capillary: 154 mg/dL — ABNORMAL HIGH (ref 70–99)

## 2013-05-14 MED ORDER — FENTANYL CITRATE 0.05 MG/ML IJ SOLN
50.0000 ug | INTRAMUSCULAR | Status: DC | PRN
  Administered 2013-05-14 – 2013-05-16 (×15): 100 ug via INTRAVENOUS
  Administered 2013-05-16: 50 ug via INTRAVENOUS
  Administered 2013-05-16: 100 ug via INTRAVENOUS
  Filled 2013-05-14 (×17): qty 2

## 2013-05-14 MED ORDER — VITAL AF 1.2 CAL PO LIQD
1000.0000 mL | ORAL | Status: DC
  Administered 2013-05-14 – 2013-05-16 (×3): 1000 mL
  Filled 2013-05-14 (×5): qty 1000

## 2013-05-14 MED ORDER — SODIUM CHLORIDE 0.9 % IV SOLN
500.0000 mg | Freq: Two times a day (BID) | INTRAVENOUS | Status: DC
  Administered 2013-05-14: 500 mg via INTRAVENOUS
  Filled 2013-05-14 (×2): qty 5

## 2013-05-14 MED ORDER — VITAL HIGH PROTEIN PO LIQD
1000.0000 mL | ORAL | Status: DC
  Administered 2013-05-14 (×3)
  Administered 2013-05-14: 1000 mL
  Administered 2013-05-14: 14:00:00
  Filled 2013-05-14 (×2): qty 1000

## 2013-05-14 MED ORDER — SODIUM CHLORIDE 0.9 % IV SOLN
1000.0000 mg | Freq: Two times a day (BID) | INTRAVENOUS | Status: DC
  Administered 2013-05-14 – 2013-05-16 (×4): 1000 mg via INTRAVENOUS
  Filled 2013-05-14 (×5): qty 10

## 2013-05-14 MED ORDER — MAGNESIUM SULFATE 50 % IJ SOLN
4.0000 g | Freq: Once | INTRAVENOUS | Status: DC
  Filled 2013-05-14: qty 8

## 2013-05-14 MED ORDER — MAGNESIUM SULFATE 4000MG/100ML IJ SOLN
4.0000 g | Freq: Once | INTRAMUSCULAR | Status: AC
  Administered 2013-05-14: 4 g via INTRAVENOUS
  Filled 2013-05-14: qty 100

## 2013-05-14 MED ORDER — LABETALOL HCL 5 MG/ML IV SOLN: 10.0000 mg | INTRAVENOUS | Status: DC | PRN

## 2013-05-14 MED ORDER — INSULIN ASPART 100 UNIT/ML ~~LOC~~ SOLN
0.0000 [IU] | SUBCUTANEOUS | Status: DC
  Administered 2013-05-14 (×2): 1 [IU] via SUBCUTANEOUS
  Administered 2013-05-15 (×3): 2 [IU] via SUBCUTANEOUS
  Administered 2013-05-15 (×2): 3 [IU] via SUBCUTANEOUS
  Administered 2013-05-15: 2 [IU] via SUBCUTANEOUS
  Administered 2013-05-16 (×2): 3 [IU] via SUBCUTANEOUS
  Administered 2013-05-16: 5 [IU] via SUBCUTANEOUS
  Administered 2013-05-16: 2 [IU] via SUBCUTANEOUS

## 2013-05-14 MED ORDER — DEXAMETHASONE SODIUM PHOSPHATE 4 MG/ML IJ SOLN
10.0000 mg | Freq: Four times a day (QID) | INTRAMUSCULAR | Status: DC
  Administered 2013-05-14 (×2): 10 mg via INTRAVENOUS
  Filled 2013-05-14: qty 3
  Filled 2013-05-14: qty 2.5
  Filled 2013-05-14 (×2): qty 3

## 2013-05-14 NOTE — Progress Notes (Signed)
UR completed.  Kristian Mogg, RN BSN MHA CCM Trauma/Neuro ICU Case Manager [phone]  

## 2013-05-14 NOTE — Progress Notes (Signed)
Patient ID: Craig Martin, male   DOB: October 22, 1926, 80 y.o.   MRN: [id number] I have had extensive discussions with family son. We discussed patients current circumstances and organ failures. We also discussed patient's prior wishes under circumstances such as this. Family has decided to NOT perform resuscitation if arrest but to continue current medical support for now.  Lavon Paganini. Titus Mould, MD, [place] Pgr: [facility]

## 2013-05-14 NOTE — Procedures (Signed)
History: 80 yo M with metastatic disease and seizures.   Sedation:   Technique: This is a 17 channel routine scalp EEG performed at the bedside with bipolar and monopolar montages arranged in accordance to the international 10/20 system of electrode placement. One channel was dedicated to EKG recording.    Background: There there are periodic epileptiform discharges most prominent over the right frontal region throughout the study. These occur at a frequency of 1-1.5 Hz. There is not any clear evolution in frequency there is an increase in faster activity associated with noxious stimulation. The predominance of EEG consists of intermixed alpha, theta, and delta frequencies. There are, at times, structures with the appearance of sleep spindles.   Photic stimulation: Physiologic driving is not performed  EEG Abnormalities: 1) periodic lateralized epileptiform discharges in the right frontal region. 2) multifocal iregular slow activity.  Clinical Interpretation: This EEG is consistent with a potential area of epileptogenic city in the right frontal region. Though PLEDs can be associated with ongoing seizure, they're more typically associated with structural lesions and in this case are seen in the region of his known hemorrhagic metastasis. There is no clear ongoing seizure activity on this study.  Roland Rack, MD [facility] [phone]  If 7pm- 7am, please page neurology on call as listed in [place].

## 2013-05-14 NOTE — Progress Notes (Signed)
Portable EEG completed, results pending. 

## 2013-05-14 NOTE — Progress Notes (Signed)
Dr. Titus Mould called the patient's brother, Craig Martin (next of kin), at home [phone] to discuss the pt's plan of care.  [name] agreed that changing the code status to DNR was appropriate given his brother's health issues.

## 2013-05-14 NOTE — Progress Notes (Signed)
Subjective: Continues to be encephalopathic.   Exam: Filed Vitals:   05/14/13 0800  BP: 114/61  Pulse: 82  Temp: 97.6 F (36.4 C)  Resp: 18   Gen: In bed, NAD MS: Attempts to open eyes to voice, i XT:KWIOXB corneals, pupils  Motor: withdraws briskly x 4, does not follow commands.  Sensory:as above.    Impression: 80 yo M with new onset seizure in the setting of hemorrhagic brain metastasis. In the setting of known metastatic melanoma, this is likely a primary.   He was on pradaxa for afib, now holding.   Recommendations: 1)Continue keppra 500mg  BID 2) EEG 3) oncology consult.  4) continue holding pradaxa.   Roland Rack, MD [facility] [phone]  If 7pm- 7am, please page neurology on call as listed in [place].

## 2013-05-14 NOTE — H&P (Signed)
PULMONARY / CRITICAL CARE MEDICINE   Name: Craig Martin MRN: [id number] DOB: 11/14/1926    ADMISSION DATE:  05/13/2013 CONSULTATION DATE:  05/13/2013  REFERRING MD :  EDP PRIMARY SERVICE: PCCM  CHIEF COMPLAINT:  Unresponsive  BRIEF PATIENT DESCRIPTION: 80 year old male with PMH of metastatic melanoma met brain ICH.  SIGNIFICANT EVENTS / STUDIES:  5/12 intubated in ED.  LINES / TUBES: ETT 5/12>>> PIV  CULTURES: None  ANTIBIOTICS: None  SUBJECTIVE: WD to pain  VITAL SIGNS: Temp:  [97.4 F (36.3 C)-98.5 F (36.9 C)] 97.6 F (36.4 C) (05/13 0800) Pulse Rate:  [45-121] 82 (05/13 0800) Resp:  [15-30] 18 (05/13 0800) BP: (104-149)/(52-100) 114/61 mmHg (05/13 0800) SpO2:  [97 %-100 %] 100 % (05/13 0800) FiO2 (%):  [40 %-100 %] 40 % (05/13 0747) Weight:  [82 kg (180 lb 12.4 oz)-88 kg (194 lb 0.1 oz)] 82 kg (180 lb 12.4 oz) (05/13 0431) HEMODYNAMICS:   VENTILATOR SETTINGS: Vent Mode:  [-] PRVC FiO2 (%):  [40 %-100 %] 40 % Set Rate:  [16 bmp-18 bmp] 18 bmp Vt Set:  [530 mL] 530 mL PEEP:  [5 cmH20] 5 cmH20 Plateau Pressure:  [16 cmH20-18 cmH20] 16 cmH20 INTAKE / OUTPUT: Intake/Output     05/12 0701 - 05/13 0700 05/13 0701 - 05/14 0700   I.V. (mL/kg) 362.4 (4.4) 31.1 (0.4)   IV Piggyback 200    Total Intake(mL/kg) 562.4 (6.9) 31.1 (0.4)   Urine (mL/kg/hr) 783 60 (0.4)   Total Output 783 60   Net -220.6 -28.9          PHYSICAL EXAMINATION: General:  Chronically ill appearing male, intubated and wd to pain Neuro:  Not localized, perr slugggish HEENT:  /AT, PERRL, gag wnl Cardiovascular:  IRIR, Nl S1/S2, -, rat ein controlled Lungs:  Coarse BS diffusely. Abdomen:  Soft, NT, ND and +BS. Musculoskeletal:  -edema and -tenderness. Skin:  Intact.  LABS:  CBC  Recent Labs Lab 05/13/13 1715 05/14/13 0215  WBC 7.7 6.4  HGB 8.9* 8.8*  HCT 28.6* 28.0*  PLT 205 176   Coag's  Recent Labs Lab 05/13/13 1715  APTT 29  INR 1.23   BMET  Recent  Labs Lab 05/13/13 1715 05/14/13 0215  NA 139 137  K 3.3* 3.8  CL 105 106  CO2 23 19  BUN 23 22  CREATININE 1.60* 1.44*  GLUCOSE 177* 101*   Electrolytes  Recent Labs Lab 05/13/13 1715 05/14/13 0215  CALCIUM 7.8* 7.9*  MG  --  1.5  PHOS  --  3.1   Sepsis Markers  Recent Labs Lab 05/13/13 1728  LATICACIDVEN 2.16   ABG  Recent Labs Lab 05/13/13 1844 05/14/13 0350  PHART 7.308* 7.488*  PCO2ART 49.1* 28.5*  PO2ART 469.0* 168.0*   Liver Enzymes  Recent Labs Lab 05/13/13 1715  AST 16  ALT 13  ALKPHOS 47  BILITOT 0.8  ALBUMIN 2.3*   Cardiac Enzymes  Recent Labs Lab 05/13/13 1715  TROPONINI <0.30   Glucose  Recent Labs Lab 05/12/13 1032 05/14/13 0804  GLUCAP 96 95    Imaging Ct Head Wo Contrast  05/13/2013   CLINICAL DATA:  Unresponsive patient with seizure activity.  EXAM: CT HEAD WITHOUT CONTRAST  TECHNIQUE: Contiguous axial images were obtained from the base of the skull through the vertex without intravenous contrast.  COMPARISON:  None.  FINDINGS: Multiple foci of intracranial hemorrhage are present with vasogenic edema. The largest is in the right frontal lobe, measuring 45 mm x  23 mm, with hematocrit layering dependently. Sulcal effacement is present in the right frontal lobe associated with edema and hemorrhage.  There is no midline shift. Another area hemorrhage is present in the periventricular left temporal lobe, with intraventricular break through and blood layering within the occipital horn of the left lateral ventricle. There is thickening of the tentorium suggesting subdural hemorrhage however this may simply represent tentorial calcification.  There is a low-attenuation region in the posterior right frontal lobe without hemorrhage. This could represent old ischemia/infarction or vasogenic edema associated with metastatic disease.  Another low-attenuation lesion is present in the inferior right occipital lobe. There is some hemorrhage layering  within the occipital horn of the right lateral ventricle is well. No definite posterior fossa lesions.  The suprasellar cistern remains patent. Dense intracranial atherosclerosis. Endotracheal and orogastric intubation is evident on the lateral view. Paranasal sinuses appear within normal limits. Dense intracranial atherosclerosis.  IMPRESSION: 1. Multifocal intracranial hemorrhage with largest areas of bleeding in the right frontal and left temporal lobe and blood in the lateral ventricles. 2. Scattered areas of nonhemorrhagic areas of low attenuation suggest vasogenic edema potentially associated with metastatic disease. Patient has a history of melanoma and metastatic melanoma could produce multiple hemorrhagic metastases. Other differential considerations include hemorrhagic infarction, hypertensive hemorrhage and amyloid angiopathy. 3. Followup MRI with the without contrast is recommended to further assess the cause of hemorrhage. 4. Critical Value/emergent results were called by telephone at the time of interpretation on 05/13/2013 at 6:38 PM to Dr. Calvert Cantor , who verbally acknowledged these results.   Electronically Signed   By: Dereck Ligas M.D.   On: 05/13/2013 18:39   Dg Chest Port 1 View  05/13/2013   CLINICAL DATA:  Post intubation  EXAM: PORTABLE CHEST - 1 VIEW  COMPARISON:  05/13/2013 at 1721 hr  FINDINGS: Endotracheal tube terminates 5 cm above the carina.  Lungs are essentially clear. No focal consolidation. No pleural effusion or pneumothorax.  Cardiomegaly.  Enteric tube terminates in the gastric cardia.  IMPRESSION: Endotracheal tube terminates 5 cm above the carina.   Electronically Signed   By: Julian Hy M.D.   On: 05/13/2013 18:05   Dg Chest Port 1 View  05/13/2013   CLINICAL DATA:  Seizure.  EXAM: PORTABLE CHEST - 1 VIEW  COMPARISON:  DG CHEST 2 VIEW dated 05/02/2013  FINDINGS: Cardiomegaly. Tortuous thoracic aorta. Aortic arch atherosclerosis. Monitoring leads project  over the chest. Mild basilar atelectasis is present. No airspace disease. No effusion. Resection of the distal right clavicle.  IMPRESSION: Cardiomegaly and low lung volumes.  No acute abnormality.   Electronically Signed   By: Dereck Ligas M.D.   On: 05/13/2013 17:47     CXR: Cardiomegally, ETT ok.  ASSESSMENT / PLAN:  PULMONARY A: VDRF P:   - ABG reviewed, reduce MV -SBT consideration with close observation apnea alarms and TV alarms -pcxr in am , need echo for RV , appears large on PCXR  CARDIOVASCULAR A: A-fib on pradaxa, now ICH P:  - Hold anti-coag - sys goal 150, MAP 75 -tele  RENAL A:  ARF, improved, hypomag P:   - Replete electrolytes as indicated. - IVF , no free water - Monitor renal function.  GASTROINTESTINAL A:  No active issues. P:   - start TF - OGT. - lft in am  -ppi  HEMATOLOGIC / ONC A:  Likely metastatic melanoma.  Pradaxa for a-fib. P:  - repeat coags in am  - CBC daily -  Transfuse for Hg of 7 -scd -prognosis poor -steroids -unlikely radiation benefit with current neurostatus  INFECTIOUS A:  No active issues. P:   - Vomited so monitor for aspiration. - Monitor WBC and fever curve.  ENDOCRINE A:  DM by history.   P:   - ISS.  NEUROLOGIC A:  Multiple sites of ICH.  Likely metastatic disease. P:   - Oncology called, rads? - Keppra. - Decadron, ensure high dose - CT in AM. - BP goals -EEG  TODAY'S SUMMARY: 80 year old with metastatic melanoma, presenting with multiple sites of ICH and other metastatic areas in the brain.  At this point, prognosis is very poor. For eeg, start TF, sbt attempt, need family meeting  I have personally obtained a history, examined the patient, evaluated laboratory and imaging results, formulated the assessment and plan and placed orders.  CRITICAL CARE: The patient is critically ill with multiple organ systems failure and requires high complexity decision making for assessment and support,  frequent evaluation and titration of therapies, application of advanced monitoring technologies and extensive interpretation of multiple databases. Critical Care Time devoted to patient care services described in this note is 30 minutes.   Lavon Paganini. Titus Mould, MD, [place] Pgr: [facility]

## 2013-05-14 NOTE — Consult Note (Signed)
WOC wound consult note Reason for Consult: pt with wound VAC from home.  Followed by Dr. Migdalia Dk in the wound care center.  Has been using collegen matrix under VAC, however patient has not been seen in center that I can tell since 04/14/13.  Notified plastics that pt has been readmitted for possible wound evaluation this week while inpatient Wound type:Marland Kitchen Surgical site s/p melanoma removal   Measurement:9cm x 7.5cm x 0.5cm and right heel 3.0cm x 2.5cm x 0.2cm  Wound bed: heel; beefy red, ruddy.  Lateral foot pale, non granular, chronic in nature Drainage (amount, consistency, odor) minimal but strong odor, since VAC was not functional at the time of his admission Periwound:scar tissue and maceration Dressing procedure/placement/frequency: Will continue VAC dressing for now, really feel that patient could benefit from rest from [facility] for a while as the seal is easily lost and then the [facility] is non functional for a while and this causes the foot to be macerated. The odor is very strong today from where the [facility] was nonfunctional. May benefit from silver dressings for a bit.  I have contacted the plastic surgeon's PA to see patient this week for evaluation.  Family meeting scheduled today to decide on further treatments.   WOC will follow along with you for [facility] changes and wound care needs [facility] RN,CWOCN [phone]

## 2013-05-14 NOTE — Progress Notes (Signed)
INITIAL NUTRITION ASSESSMENT  DOCUMENTATION CODES Per approved criteria  -Not Applicable   INTERVENTION: Initiate Vital AF 1.2 @ 20 ml/hr via OGT and increase by 10 ml every 4 hours to goal rate of 60 ml/hr.  At goal rate, tube feeding regimen will provide 1728 kcal, 108 grams of protein, and 1168 ml of H2O.   RD to continue to monitor   NUTRITION DIAGNOSIS: Inadequate oral intake related to inability to eat as evidenced by NPO/Vent status.   Goal: Pt to meet >/= 90% of their estimated nutrition needs   Monitor:  TF initiation/tolerance, respiratory status, weight trend, labs  Reason for Assessment: Consult for tube feeding intitation and management  80 y.o. male  Admitting Dx: <principal problem not specified>  ASSESSMENT: 80 year old male with PMH of metastatic melanoma met brain ICH. 5/12 intubated in ED.   Pt has OGT in place with Vital High Protein running at 20 ml/hr. Recent residual of 0 ml. Family at bedside report they have noticed pt has lost weight but, they are unsure how much. They report pt was eating well/normally PTA.  Per MD note, pt with poor prognosis, need family meeting.   Nutrition Focused Physical Exam:  Subcutaneous Fat:  Orbital Region: wnl Upper Arm Region: mild wasting Thoracic and Lumbar Region: NA  Muscle:  Temple Region: wnl Clavicle Bone Region: moderate wasting Clavicle and Acromion Bone Region: mild/moderate wasting Scapular Bone Region: NA Dorsal Hand: NA (mitts) Patellar Region: mild wasting Anterior Thigh Region: moderate wasting Posterior Calf Region: mild wasting  Edema: none noted  Height: Ht Readings from Last 1 Encounters:  05/02/13 _0  (1.753 m)    Weight: Wt Readings from Last 1 Encounters:  05/14/13 180 lb 12.4 oz (82 kg)    Ideal Body Weight: 160 lbs  % Ideal Body Weight: 113%  Wt Readings from Last 10 Encounters:  05/14/13 180 lb 12.4 oz (82 kg)  05/06/13 195 lb 5.2 oz (88.6 kg)  05/06/13 195 lb 5.2  oz (88.6 kg)  05/06/13 195 lb 5.2 oz (88.6 kg)  01/12/13 172 lb 6.4 oz (78.2 kg)  12/05/12 171 lb 11.2 oz (77.883 kg)  12/05/12 171 lb 11.2 oz (77.883 kg)  11/19/12 182 lb (82.555 kg)  10/23/12 183 lb 3.2 oz (83.1 kg)  10/23/12 183 lb 3.2 oz (83.1 kg)    Usual Body Weight: 175-180 lbs  % Usual Body Weight: 100%  BMI:  Body mass index is 26.68 kg/(m^2).  Patient is currently intubated on ventilator support MV: 9.6 L/min Temp (24hrs), Avg:97.9 F (36.6 C), Min:97.4 F (36.3 C), Max:98.5 F (36.9 C)  Propofol: none currently  Estimated Nutritional Needs: Kcal: 1694 Protein: 100-112 grams Fluid: >/= 1.7 L/day  Skin: +1 RLE and LLE edema per nursing notes  Diet Order: NPO  EDUCATION NEEDS: -No education needs identified at this time   Intake/Output Summary (Last 24 hours) at 05/14/13 1444 Last data filed at 05/14/13 1400  Gross per 24 hour  Intake  807.2 ml  Output    973 ml  Net -165.8 ml    Last BM: PTA  Labs:   Recent Labs Lab 05/13/13 1715 05/14/13 0215  NA 139 137  K 3.3* 3.8  CL 105 106  CO2 23 19  BUN 23 22  CREATININE 1.60* 1.44*  CALCIUM 7.8* 7.9*  MG  --  1.5  PHOS  --  3.1  GLUCOSE 177* 101*    CBG (last 3)   Recent Labs  05/12/13 1032  05/14/13 0804 05/14/13 1239  GLUCAP 96 95 104*    Scheduled Meds: . antiseptic oral rinse  15 mL Mouth Rinse QID  . chlorhexidine  15 mL Mouth Rinse BID  . dexamethasone  10 mg Intravenous 4 times per day  . feeding supplement (VITAL HIGH PROTEIN)  1,000 mL Per Tube Q24H  . insulin aspart  0-9 Units Subcutaneous 6 times per day  . levETIRAcetam  500 mg Intravenous Q12H  . pantoprazole (PROTONIX) IV  40 mg Intravenous Daily    Continuous Infusions: . sodium chloride 10 mL/hr at 05/14/13 1400    Past Medical History  Diagnosis Date  . Glaucoma   . Bradycardia   . Diabetes mellitus   . Hyperlipidemia   . Hypertension   . Shortness of breath     WITH EXERTION  . Stroke     "I had a  stroke many yrs ago" -no residual problems  . BPH (benign prostatic hyperplasia)   . Difficulty voiding   . Dysrhythmia     AFIB  - TAKES PRADAXA FOLLOWED BY DR. Gwenlyn Found  . Critical lower limb ischemia   . Melanoma   . Arthritis   . Coronary artery disease     Past Surgical History  Procedure Laterality Date  . Stented coronary artery  2004  . Transurethral resection of prostate  01/24/2012    Procedure: TRANSURETHRAL RESECTION OF THE PROSTATE WITH GYRUS INSTRUMENTS;  Surgeon: Alexis Frock, MD;  Location: [facility];  Service: Urology;  Laterality: N/A;  . Cardiac catheterization  09/19/2002    moderate LAD and RCA and normal LV function  . Nm myocar perf wall motion  05/24/2011    showed inferior scar w/o ischemia  . Doppler echocardiography  05/24/2011    EF >55%  . Doppler echocardiography  09/28/2009    EF >55% lvh w/ left atrial enlargement,diastolic dysfunctio,elevated lv filling pressure  . Nm myocar perf wall motion  09/09/2009    mild ischemia mid anterior,apical anterior and basal inferolateral  . Nm myocar perf wall motion  12/04/2002  . Nm myocar perf wall motion  05/21/2002  . Cardiac catheterization  07/03/2002  . Cataract extraction    . Melanoma excision Right 10/21/2012    Procedure: MELANOMA EXCISION;  Surgeon: Zenovia Jarred, MD;  Location: [facility];  Service: General;  Laterality: Right;  . Inguinal lymph node biopsy Right 10/21/2012    Procedure: INGUINAL LYMPH NODE BIOPSY;  Surgeon: Zenovia Jarred, MD;  Location: [place];  Service: General;  Laterality: Right;  . Application of a-cell of extremity Right 10/21/2012    Procedure: APPLICATION OF A-CELL OF EXTREMITY;  Surgeon: Theodoro Kos, DO;  Location: [place];  Service: Plastics;  Laterality: Right;  . Incision and drainage of wound Right 12/04/2012    Procedure: IRRIGATION AND DEBRIDEMENT RIGHT FOOT WOUND WITH PLACEMENT OF ACELL/VAC;  Surgeon: Theodoro Kos, DO;  Location: [place];  Service:  Plastics;  Laterality: Right;  . Esophagogastroduodenoscopy N/A 05/05/2013    Procedure: ESOPHAGOGASTRODUODENOSCOPY (EGD);  Surgeon: Jerene Bears, MD;  Location: [place];  Service: Gastroenterology;  Laterality: N/A;    Pryor Ochoa RD, LDN Inpatient Clinical Dietitian Pager: [phone] After Hours Pager: [phone]

## 2013-05-15 ENCOUNTER — Ambulatory Visit
Admit: 2013-05-15 | Discharge: 2013-05-15 | Disposition: A | Discharge status: Home / Self Care | Payer: Medicare Other | Attending: Radiation Oncology | Admitting: Radiation Oncology

## 2013-05-15 ENCOUNTER — Inpatient Hospital Stay (HOSPITAL_COMMUNITY): Payer: Medicare Other

## 2013-05-15 DIAGNOSIS — I4891 Unspecified atrial fibrillation: Secondary | ICD-10-CM

## 2013-05-15 DIAGNOSIS — G9389 Other specified disorders of brain: Secondary | ICD-10-CM

## 2013-05-15 DIAGNOSIS — C4359 Malignant melanoma of other part of trunk: Secondary | ICD-10-CM

## 2013-05-15 DIAGNOSIS — I629 Nontraumatic intracranial hemorrhage, unspecified: Secondary | ICD-10-CM

## 2013-05-15 DIAGNOSIS — R569 Unspecified convulsions: Secondary | ICD-10-CM

## 2013-05-15 LAB — CBC WITH DIFFERENTIAL/PLATELET
Basophils Absolute: 0 10*3/uL (ref 0.0–0.1)
Basophils Relative: 0 % (ref 0–1)
Eosinophils Absolute: 0 10*3/uL (ref 0.0–0.7)
Eosinophils Relative: 0 % (ref 0–5)
HEMATOCRIT: 27.2 % — AB (ref 39.0–52.0)
Hemoglobin: 8.6 g/dL — ABNORMAL LOW (ref 13.0–17.0)
LYMPHS ABS: 0.6 10*3/uL — AB (ref 0.7–4.0)
Lymphocytes Relative: 8 % — ABNORMAL LOW (ref 12–46)
MCH: 27.8 pg (ref 26.0–34.0)
MCHC: 31.6 g/dL (ref 30.0–36.0)
MCV: 88 fL (ref 78.0–100.0)
Monocytes Absolute: 0.2 10*3/uL (ref 0.1–1.0)
Monocytes Relative: 2 % — ABNORMAL LOW (ref 3–12)
Neutro Abs: 6.8 10*3/uL (ref 1.7–7.7)
Neutrophils Relative %: 90 % — ABNORMAL HIGH (ref 43–77)
PLATELETS: 181 10*3/uL (ref 150–400)
RBC: 3.09 MIL/uL — ABNORMAL LOW (ref 4.22–5.81)
RDW: 18.5 % — ABNORMAL HIGH (ref 11.5–15.5)
WBC: 7.6 10*3/uL (ref 4.0–10.5)

## 2013-05-15 LAB — PROTIME-INR
INR: 1.28 (ref 0.00–1.49)
Prothrombin Time: 15.7 seconds — ABNORMAL HIGH (ref 11.6–15.2)

## 2013-05-15 LAB — BLOOD GAS, ARTERIAL
Acid-base deficit: 1.5 mmol/L (ref 0.0–2.0)
Bicarbonate: 22.5 mEq/L (ref 20.0–24.0)
Drawn by: 313941
FIO2: 0.3 %
O2 Saturation: 100 %
PATIENT TEMPERATURE: 98.6
PEEP/CPAP: 5 cmH2O
PH ART: 7.409 (ref 7.350–7.450)
RATE: 12 resp/min
TCO2: 23.6 mmol/L (ref 0–100)
VT: 530 mL
pCO2 arterial: 36.3 mmHg (ref 35.0–45.0)
pO2, Arterial: 110 mmHg — ABNORMAL HIGH (ref 80.0–100.0)

## 2013-05-15 LAB — COMPREHENSIVE METABOLIC PANEL
ALBUMIN: 1.9 g/dL — AB (ref 3.5–5.2)
ALT: 11 U/L (ref 0–53)
AST: 14 U/L (ref 0–37)
Alkaline Phosphatase: 44 U/L (ref 39–117)
BILIRUBIN TOTAL: 0.8 mg/dL (ref 0.3–1.2)
BUN: 21 mg/dL (ref 6–23)
CALCIUM: 8.3 mg/dL — AB (ref 8.4–10.5)
CO2: 21 mEq/L (ref 19–32)
CREATININE: 1.39 mg/dL — AB (ref 0.50–1.35)
Chloride: 106 mEq/L (ref 96–112)
GFR calc Af Amer: 52 mL/min — ABNORMAL LOW (ref >90)
GFR calc non Af Amer: 45 mL/min — ABNORMAL LOW (ref >90)
Glucose, Bld: 186 mg/dL — ABNORMAL HIGH (ref 70–99)
Potassium: 3.6 mEq/L — ABNORMAL LOW (ref 3.7–5.3)
Sodium: 141 mEq/L (ref 137–147)
TOTAL PROTEIN: 7.3 g/dL (ref 6.0–8.3)

## 2013-05-15 LAB — GLUCOSE, CAPILLARY
GLUCOSE-CAPILLARY: 177 mg/dL — AB (ref 70–99)
GLUCOSE-CAPILLARY: 188 mg/dL — AB (ref 70–99)
GLUCOSE-CAPILLARY: 197 mg/dL — AB (ref 70–99)
GLUCOSE-CAPILLARY: 203 mg/dL — AB (ref 70–99)
Glucose-Capillary: 209 mg/dL — ABNORMAL HIGH (ref 70–99)
Glucose-Capillary: 155 mg/dL — ABNORMAL HIGH (ref 70–99)
Glucose-Capillary: 192 mg/dL — ABNORMAL HIGH (ref 70–99)

## 2013-05-15 LAB — APTT: aPTT: 39 seconds — ABNORMAL HIGH (ref 24–37)

## 2013-05-15 MED ORDER — DEXAMETHASONE SODIUM PHOSPHATE 10 MG/ML IJ SOLN
10.0000 mg | Freq: Four times a day (QID) | INTRAMUSCULAR | Status: DC
  Administered 2013-05-15 – 2013-05-16 (×6): 10 mg via INTRAVENOUS
  Filled 2013-05-15 (×10): qty 1

## 2013-05-15 MED ORDER — SODIUM CHLORIDE 0.9 % IV BOLUS (SEPSIS): 500.0000 mL | Freq: Once | INTRAVENOUS | Status: DC

## 2013-05-15 MED ORDER — SODIUM CHLORIDE 0.9 % IV BOLUS (SEPSIS)
1000.0000 mL | Freq: Once | INTRAVENOUS | Status: AC
  Administered 2013-05-15: 1000 mL via INTRAVENOUS

## 2013-05-15 MED ORDER — SODIUM CHLORIDE 0.9 % IV BOLUS (SEPSIS)
500.0000 mL | Freq: Once | INTRAVENOUS | Status: AC
  Administered 2013-05-15: 500 mL via INTRAVENOUS

## 2013-05-15 NOTE — Consult Note (Signed)
[place]  [phone]   HEMATOLOGY ONCOLOGY CONSULTATION   Craig Martin  DOB: [birth year]  MR#: [id number]  CSN#: [id number]    Requesting Physician: Dr. Titus Mould Primary MD:   History of present illness:     Mr. Nakao is an 80 year old African American  man asked to see for evaluation of metastatic melanoma.  In review, he was initially diagnosed in October 2014, s/p excision (see below path report), of lesion on right foot with right inguinal lymph node biopsy.  He was admitted on 5/12 with  Tonic clonic seizures without trauma. At the ED he was found apneic requiring intubation. CT of the head without contrast on 5/12 showed multifocal intracranial hemorrhage with largest areas of bleeding in the right frontal and left temporal lobe and blood in the lateral ventricles,  scattered areas of  vasogenic edema potentially associated with metastatic disease.He was placed on Keppra and IV decadron. He was on Pradaxa which was placed on hold in the setting of hemorrhagic lesions. No other CTs for staging have been ordered for review. He was placed on DNR status.      Past medical history:      Past Medical History  Diagnosis Date  . Glaucoma   . Bradycardia   . Diabetes mellitus   . Hyperlipidemia   . Hypertension   . Shortness of breath     WITH EXERTION  . Stroke     "I had a stroke many yrs ago" -no residual problems  . BPH (benign prostatic hyperplasia)   . Difficulty voiding   . Dysrhythmia     AFIB  - TAKES PRADAXA FOLLOWED BY DR. Gwenlyn Found  . Critical lower limb ischemia   . Melanoma   . Arthritis   . Coronary artery disease     Past surgical history:      Past Surgical History  Procedure Laterality Date  . Stented coronary artery  2004  . Transurethral resection of prostate  01/24/2012    Procedure: TRANSURETHRAL RESECTION OF THE PROSTATE WITH GYRUS INSTRUMENTS;  Surgeon: Alexis Frock, MD;  Location: [facility];  Service: Urology;   Laterality: N/A;  . Cardiac catheterization  09/19/2002    moderate LAD and RCA and normal LV function  . Nm myocar perf wall motion  05/24/2011    showed inferior scar w/o ischemia  . Doppler echocardiography  05/24/2011    EF >55%  . Doppler echocardiography  09/28/2009    EF >55% lvh w/ left atrial enlargement,diastolic dysfunctio,elevated lv filling pressure  . Nm myocar perf wall motion  09/09/2009    mild ischemia mid anterior,apical anterior and basal inferolateral  . Nm myocar perf wall motion  12/04/2002  . Nm myocar perf wall motion  05/21/2002  . Cardiac catheterization  07/03/2002  . Cataract extraction    . Melanoma excision Right 10/21/2012    Procedure: MELANOMA EXCISION;  Surgeon: Zenovia Jarred, MD;  Location: [place] [place];  Service: General;  Laterality: Right;  . Inguinal lymph node biopsy Right 10/21/2012    Procedure: INGUINAL LYMPH NODE BIOPSY;  Surgeon: Zenovia Jarred, MD;  Location: [place];  Service: General;  Laterality: Right;  . Application of a-cell of extremity Right 10/21/2012    Procedure: APPLICATION OF A-CELL OF EXTREMITY;  Surgeon: Theodoro Kos, DO;  Location: [facility];  Service: Plastics;  Laterality: Right;  . Incision and drainage of wound Right 12/04/2012    Procedure: IRRIGATION AND DEBRIDEMENT RIGHT FOOT WOUND WITH  PLACEMENT OF ACELL/VAC;  Surgeon: Theodoro Kos, DO;  Location: [facility];  Service: Plastics;  Laterality: Right;  . Esophagogastroduodenoscopy N/A 05/05/2013    Procedure: ESOPHAGOGASTRODUODENOSCOPY (EGD);  Surgeon: Jerene Bears, MD;  Location: [place];  Service: Gastroenterology;  Laterality: N/A;    Medications:  Scheduled Meds: . antiseptic oral rinse  15 mL Mouth Rinse QID  . chlorhexidine  15 mL Mouth Rinse BID  . dexamethasone  10 mg Intravenous 4 times per day  . insulin aspart  0-9 Units Subcutaneous 6 times per day  . levETIRAcetam  1,000 mg Intravenous Q12H  . pantoprazole (PROTONIX) IV  40 mg Intravenous Daily   . sodium chloride  1,000 mL Intravenous Once   Continuous Infusions: . sodium chloride 10 mL/hr at 05/14/13 2200  . feeding supplement (VITAL AF 1.2 CAL) 1,000 mL (05/15/13 0300)   PRN Meds:.sodium chloride, fentaNYL, labetalol  Allergies: No Known Allergies  Family history:     Family History  Problem Relation Age of Onset  . Diabetes Mother   . Diabetes Brother                                        Social history:      History   Social History  . Marital Status: Single    Spouse Name: N/A    Number of Children: N/A  . Years of Education: N/A   Occupational History  . Not on file.   Social History Main Topics  . Smoking status: Former Smoker -- 1.50 packs/day    Types: Cigarettes    Quit date: 01/16/1986  . Smokeless tobacco: Former Systems developer  . Alcohol Use: No  . Drug Use: No  . Sexual Activity: No   Other Topics Concern  . Not on file   Social History Narrative  . No narrative on file    Health maintenance:                 History  Substance Use Topics  . Smoking status: Former Smoker -- 1.50 packs/day    Types: Cigarettes    Quit date: 01/16/1986  . Smokeless tobacco: Former Systems developer  . Alcohol Use: No        ROS: Unable to obtain, patient is intubated and sedated   Family History:    Family History  Problem Relation Age of Onset  . Diabetes Mother   . Diabetes Brother     No family history of hematological  disorders.  Social History:  reports that he quit smoking about 27 years ago. His smoking use included Cigarettes. He smoked 1.50 packs per day. He has quit using smokeless tobacco. He reports that he does not drink alcohol or use illicit drugs.   Physical Exam    ECOG PERFORMANCE STATUS: 4  Filed Vitals:   05/15/13 0900  BP: 93/45  Pulse: 86  Temp:   Resp: 12   Filed Weights   05/13/13 1823 05/14/13 0431 05/15/13 0422  Weight: 194 lb 0.1 oz (88 kg) 180 lb 12.4 oz (82 kg) 178 lb 2.1 oz (80.8 kg)    GENERAL:alert, no  distress, intubated and sedated SKIN: skin color, texture, turgor are normal, no rashes. Right foot care as per [facility] EYES: closed, did not disturb OROPHARYNX: patient intubated NECK: supple, thyroid normal size, non-tender, without nodularity LYMPH:  no palpable lymphadenopathy in the cervical, axillary or inguinal  LUNGS: clear to auscultation and percussion with normal breathing effort HEART: irregular rate & rhythm and no murmurs and no lower extremity edema ABDOMEN:abdomen soft, non-tender and normal bowel sounds Musculoskeletal:no cyanosis of digits and no clubbing  PSYCH: alert & oriented x 3 with fluent speech NEURO:follows very simple commands. Intubated   Lab results:       CBC  Recent Labs Lab 05/13/13 1715 05/14/13 0215 05/15/13 0137  WBC 7.7 6.4 7.6  HGB 8.9* 8.8* 8.6*  HCT 28.6* 28.0* 27.2*  PLT 205 176 181  MCV 89.4 88.9 88.0  MCH 27.8 27.9 27.8  MCHC 31.1 31.4 31.6  RDW 18.4* 18.4* 18.5*  LYMPHSABS 3.5  --  0.6*  MONOABS 0.5  --  0.2  EOSABS 0.1  --  0.0  BASOSABS 0.1  --  0.0     Chemistries   Recent Labs Lab 05/13/13 1715 05/14/13 0215 05/15/13 0137  NA 139 137 141  K 3.3* 3.8 3.6*  CL 105 106 106  CO2 23 19 21   GLUCOSE 177* 101* 186*  BUN 23 22 21   CREATININE 1.60* 1.44* 1.39*  CALCIUM 7.8* 7.9* 8.3*  MG  --  1.5  --      Coagulation profile  Recent Labs Lab 05/13/13 1715 05/15/13 0137  INR 1.23 1.28    Urine Studies No results found for this basename: UACOL, UAPR, USPG, UPH, UTP, UGL, UKET, UBIL, UHGB, UNIT, UROB, ULEU, UEPI, UWBC, URBC, UBAC, CAST, CRYS, UCOM, BILUA,  in the last 72 hours  Studies:      Dg Chest 2 View  05/02/2013   CLINICAL DATA:  Worsening shortness of breath.  Ex-smoker.  EXAM: CHEST  2 VIEW  COMPARISON:  01/11/2013.  FINDINGS: Stable enlarged cardiac silhouette. Small amount of airspace opacity at the posterior lung bases on the lateral view, most likely on left, medially on the frontal view. The remainder of  the lungs are clear with normal vascularity. Mild thoracic spine degenerative changes. Mild central peribronchial thickening.  IMPRESSION: 1. Mild left lower lobe pneumonia or patchy atelectasis. 2. Mild bronchitic changes. 3. Cardiomegaly.   Electronically Signed   By: Enrique Sack M.D.   On: 05/02/2013 18:50   Ct Head Wo Contrast  05/13/2013   CLINICAL DATA:  Unresponsive patient with seizure activity.  EXAM: CT HEAD WITHOUT CONTRAST  TECHNIQUE: Contiguous axial images were obtained from the base of the skull through the vertex without intravenous contrast.  COMPARISON:  None.  FINDINGS: Multiple foci of intracranial hemorrhage are present with vasogenic edema. The largest is in the right frontal lobe, measuring 45 mm x 23 mm, with hematocrit layering dependently. Sulcal effacement is present in the right frontal lobe associated with edema and hemorrhage.  There is no midline shift. Another area hemorrhage is present in the periventricular left temporal lobe, with intraventricular break through and blood layering within the occipital horn of the left lateral ventricle. There is thickening of the tentorium suggesting subdural hemorrhage however this may simply represent tentorial calcification.  There is a low-attenuation region in the posterior right frontal lobe without hemorrhage. This could represent old ischemia/infarction or vasogenic edema associated with metastatic disease.  Another low-attenuation lesion is present in the inferior right occipital lobe. There is some hemorrhage layering within the occipital horn of the right lateral ventricle is well. No definite posterior fossa lesions.  The suprasellar cistern remains patent. Dense intracranial atherosclerosis. Endotracheal and orogastric intubation is evident on the lateral view. Paranasal sinuses appear within normal  limits. Dense intracranial atherosclerosis.  IMPRESSION: 1. Multifocal intracranial hemorrhage with largest areas of bleeding in the  right frontal and left temporal lobe and blood in the lateral ventricles. 2. Scattered areas of nonhemorrhagic areas of low attenuation suggest vasogenic edema potentially associated with metastatic disease. Patient has a history of melanoma and metastatic melanoma could produce multiple hemorrhagic metastases. Other differential considerations include hemorrhagic infarction, hypertensive hemorrhage and amyloid angiopathy. 3. Followup MRI with the without contrast is recommended to further assess the cause of hemorrhage. 4. Critical Value/emergent results were called by telephone at the time of interpretation on 05/13/2013 at 6:38 PM to Dr. Calvert Cantor , who verbally acknowledged these results.   Electronically Signed   By: Dereck Ligas M.D.   On: 05/13/2013 18:39   Dg Chest Port 1 View  05/15/2013   CLINICAL DATA:  Intubation, evaluate endotracheal tube position  EXAM: PORTABLE CHEST - 1 VIEW  COMPARISON:  Portable exam 0536 hr compared to 05/13/2013  FINDINGS: Rotated to the RIGHT.  Tip of endotracheal tube projects 4.8 cm above carina.  Nasogastric tube extends into stomach.  Enlargement of cardiac silhouette.  Mediastinal contours and pulmonary vascularity normal.  RIGHT basilar atelectasis versus consolidation.  Remaining lungs clear.  No pleural effusion or pneumothorax.  IMPRESSION: Atelectasis versus consolidation at RIGHT lower lobe.   Electronically Signed   By: Lavonia Dana M.D.   On: 05/15/2013 07:54   Dg Chest Port 1 View  05/13/2013   CLINICAL DATA:  Post intubation  EXAM: PORTABLE CHEST - 1 VIEW  COMPARISON:  05/13/2013 at 1721 hr  FINDINGS: Endotracheal tube terminates 5 cm above the carina.  Lungs are essentially clear. No focal consolidation. No pleural effusion or pneumothorax.  Cardiomegaly.  Enteric tube terminates in the gastric cardia.  IMPRESSION: Endotracheal tube terminates 5 cm above the carina.   Electronically Signed   By: Julian Hy M.D.   On: 05/13/2013 18:05   Dg  Chest Port 1 View  05/13/2013   CLINICAL DATA:  Seizure.  EXAM: PORTABLE CHEST - 1 VIEW  COMPARISON:  DG CHEST 2 VIEW dated 05/02/2013  FINDINGS: Cardiomegaly. Tortuous thoracic aorta. Aortic arch atherosclerosis. Monitoring leads project over the chest. Mild basilar atelectasis is present. No airspace disease. No effusion. Resection of the distal right clavicle.  IMPRESSION: Cardiomegaly and low lung volumes.  No acute abnormality.   Electronically Signed   By: Dereck Ligas M.D.   On: 05/13/2013 17:47    Patient: TIARA, BARTOLI Collected: 10/21/2012 Client: [facility] Accession: [id number] Received: 10/21/2012 Georganna Skeans DOB: 1926/05/15 Age: 27 Gender: M Reported: 10/24/2012 [address] Patient Ph: [phone] MRN #: [id number] [place], [place] [address] Visit #: [id number] Chart #: Phone: [phone] Fax: CC: Theodoro Kos, MD REPORT OF SURGICAL PATHOLOGY FINAL DIAGNOSIS Diagnosis 1. Lymph node, biopsy, Right inguinal - METASTATIC MALIGNANT MELANOMA IN 1 OF 1 LYMPH NODE (1/1). 2. Soft tissue tumor, extensive resection, Right foot - INVASIVE MALIGNANT MELANOMA, SPANNING AT LEAST 2.8 AND 2.0 CM. - LYMPHOVASCULAR INVASION IS IDENTIFIED. - THE SURGICAL RESECTION MARGINS ARE NEGATIVE FOR INVASIVE MELANOMA. - SEE ONCOLOGY TABLE BELOW. 3. Soft tissue, biopsy, Right foot lateral deep margin - BENIGN FIBROADIPOSE TISSUE WITH SKELETAL MUSCLE. - THERE IS NO EVIDENCE OF MALIGNANCY. 4. Soft tissue, biopsy, Right foot medial deep margin - BENIGN FIBROADIPOSE TISSUE. - THERE IS NO EVIDENCE OF MALIGNANCY. 5. Soft tissue, biopsy, Right foot central deep margin - BENIGN FIBROADIPOSE TISSUE. - THERE IS NO EVIDENCE OF MALIGNANCY.  6. Soft tissue, biopsy, Right foot medial distal deep margin - BENIGN FIBROADIPOSE TISSUE. - THERE IS NO EVIDENCE OF MALIGNANCY. 7. Soft tissue, biopsy, Right foot lateral distal margin - BENIGN FIBROADIPOSE TISSUE. - THERE IS NO EVIDENCE OF  MALIGNANCY. Microscopic Comment 2. MELANOMA TABLE - Type: Likely acral lentiginous with nodular architecture Clark's level: IV Breslow's measurement (millimeters): 7.5 mm Host response: Brisk Mitoses: At least 3 per square mm Regression: Not identified Ulceration: Present Satellitosis: Not identified 1 of 3  FINAL for SENAN, UREY ([id number]) Microscopic Comment(continued) Vascular invasion: Present Margin: Negative for melanoma Pathologic stage: At least [facility] Comment: Grossly, there are two lesions identified, spanning 2.8 cm and 2.0 cm. Histologic evaluation reveals invasive malignant melanoma with surface ulceration. Of note, there is a melanoma deposit in tissue between these two grossly identifiable lesions, raising the possibility that one of the lesions represents a metastatic focus. Nonetheless, the larger lesion spans 2.8 cm and shows a depth of invasion of 0.75 cm (7.5 mm). There is lymphovascular invasion identified and the lymph node from specimen #1shows macrometastatic malignant melanoma. Enid Cutter MD Pathologist, Electronic Signature (Case signed 10/24/2012) Specimen Gross and Clinical Information  Assessmnent/Plan:80 y.o. male with a  1.History of melanoma  2. Metastatic hemorrhagic lesions, suspected metastatic melanoma Initially diagnosed from surgical resection from a  right foot lesion and right inguinal lymph node biopsy. He is being admitted with seizures and found per CT brain to have hemorrhagic metastatic brain lesions. He is not  a chemotherapy candidate with his poor performance status and multiple commorbidities. Agree with palliative care evaluation and comfort measures. Radiation Oncology has been contacted for possible brain radiation due to hemorrhagic nature of brain masses. Patient is DNR.  3. History of Atrial Fibrillation Pradaxa is on hold due to hemorrhagic lesions.   Other medical issues as per admitting team  Thank you for allowing  [place] to participate in the care of this pleasant patient. We'll follow the patient with you during the hospital course   Elease Hashimoto 05/15/2013  Wilmon Arms, MD Medical oncology

## 2013-05-15 NOTE — H&P (Signed)
PULMONARY / CRITICAL CARE MEDICINE   Name: Craig Martin MRN: [id number] DOB: Jan 02, 1927    ADMISSION DATE:  05/13/2013 CONSULTATION DATE:  05/13/2013  REFERRING MD :  EDP PRIMARY SERVICE: PCCM  CHIEF COMPLAINT:  Unresponsive  BRIEF PATIENT DESCRIPTION: 80 year old male with PMH of metastatic melanoma met brain ICH.  SIGNIFICANT EVENTS / STUDIES:  5/12 intubated in ED. 5/13- DNR established 5/14- poor weaning effort  LINES / TUBES: ETT 5/12>>> PIV  CULTURES: None  ANTIBIOTICS: None  SUBJECTIVE: apneic on wean  VITAL SIGNS: Temp:  [97.6 F (36.4 C)-99 F (37.2 C)] 97.6 F (36.4 C) (05/14 0800) Pulse Rate:  [70-97] 82 (05/14 0800) Resp:  [1-20] 17 (05/14 0800) BP: (86-149)/(34-93) 109/52 mmHg (05/14 0800) SpO2:  [100 %] 100 % (05/14 0800) FiO2 (%):  [30 %-40 %] 30 % (05/14 0800) Weight:  [80.8 kg (178 lb 2.1 oz)] 80.8 kg (178 lb 2.1 oz) (05/14 0422) HEMODYNAMICS:   VENTILATOR SETTINGS: Vent Mode:  [-] PRVC FiO2 (%):  [30 %-40 %] 30 % Set Rate:  [18 bmp] 18 bmp Vt Set:  [530 mL] 530 mL PEEP:  [5 cmH20] 5 cmH20 Plateau Pressure:  [15 cmH20-19 cmH20] 15 cmH20 INTAKE / OUTPUT: Intake/Output     05/13 0701 - 05/14 0700 05/14 0701 - 05/15 0700   I.V. (mL/kg) 261.1 (3.2) 10 (0.1)   NG/GT 807.2 60   IV Piggyback 100    Total Intake(mL/kg) 1168.3 (14.5) 70 (0.9)   Urine (mL/kg/hr) 1288 (0.7) 30 (0.2)   Total Output 1288 30   Net -119.7 +40          PHYSICAL EXAMINATION: General:  Chronically ill appearing male, intubated Neuro:  Follows commands, new, lethargic HEENT:  [place]/AT, PERRL, gag wnl Cardiovascular:  IRIR, Nl S1/S2 Lungs:  CTA. Abdomen:  Soft, NT, ND and +BS. Musculoskeletal:  -edema and -tenderness. Skin:  Intact.  LABS:  CBC  Recent Labs Lab 05/13/13 1715 05/14/13 0215 05/15/13 0137  WBC 7.7 6.4 7.6  HGB 8.9* 8.8* 8.6*  HCT 28.6* 28.0* 27.2*  PLT 205 176 181   Coag's  Recent Labs Lab 05/13/13 1715 05/15/13 0137  APTT 29 39*   INR 1.23 1.28   BMET  Recent Labs Lab 05/13/13 1715 05/14/13 0215 05/15/13 0137  NA 139 137 141  K 3.3* 3.8 3.6*  CL 105 106 106  CO2 23 19 21   BUN 23 22 21   CREATININE 1.60* 1.44* 1.39*  GLUCOSE 177* 101* 186*   Electrolytes  Recent Labs Lab 05/13/13 1715 05/14/13 0215 05/15/13 0137  CALCIUM 7.8* 7.9* 8.3*  MG  --  1.5  --   PHOS  --  3.1  --    Sepsis Markers  Recent Labs Lab 05/13/13 1728  LATICACIDVEN 2.16   ABG  Recent Labs Lab 05/13/13 1844 05/14/13 0350  PHART 7.308* 7.488*  PCO2ART 49.1* 28.5*  PO2ART 469.0* 168.0*   Liver Enzymes  Recent Labs Lab 05/13/13 1715 05/15/13 0137  AST 16 14  ALT 13 11  ALKPHOS 47 44  BILITOT 0.8 0.8  ALBUMIN 2.3* 1.9*   Cardiac Enzymes  Recent Labs Lab 05/13/13 1715  TROPONINI <0.30   Glucose  Recent Labs Lab 05/14/13 1633 05/14/13 1954 05/14/13 2217 05/15/13 0005 05/15/13 0337 05/15/13 0759  GLUCAP 134* 144* 154* 177* 188* 209*    Imaging Ct Head Wo Contrast  05/13/2013   CLINICAL DATA:  Unresponsive patient with seizure activity.  EXAM: CT HEAD WITHOUT CONTRAST  TECHNIQUE: Contiguous axial  images were obtained from the base of the skull through the vertex without intravenous contrast.  COMPARISON:  None.  FINDINGS: Multiple foci of intracranial hemorrhage are present with vasogenic edema. The largest is in the right frontal lobe, measuring 45 mm x 23 mm, with hematocrit layering dependently. Sulcal effacement is present in the right frontal lobe associated with edema and hemorrhage.  There is no midline shift. Another area hemorrhage is present in the periventricular left temporal lobe, with intraventricular break through and blood layering within the occipital horn of the left lateral ventricle. There is thickening of the tentorium suggesting subdural hemorrhage however this may simply represent tentorial calcification.  There is a low-attenuation region in the posterior right frontal lobe without  hemorrhage. This could represent old ischemia/infarction or vasogenic edema associated with metastatic disease.  Another low-attenuation lesion is present in the inferior right occipital lobe. There is some hemorrhage layering within the occipital horn of the right lateral ventricle is well. No definite posterior fossa lesions.  The suprasellar cistern remains patent. Dense intracranial atherosclerosis. Endotracheal and orogastric intubation is evident on the lateral view. Paranasal sinuses appear within normal limits. Dense intracranial atherosclerosis.  IMPRESSION: 1. Multifocal intracranial hemorrhage with largest areas of bleeding in the right frontal and left temporal lobe and blood in the lateral ventricles. 2. Scattered areas of nonhemorrhagic areas of low attenuation suggest vasogenic edema potentially associated with metastatic disease. Patient has a history of melanoma and metastatic melanoma could produce multiple hemorrhagic metastases. Other differential considerations include hemorrhagic infarction, hypertensive hemorrhage and amyloid angiopathy. 3. Followup MRI with the without contrast is recommended to further assess the cause of hemorrhage. 4. Critical Value/emergent results were called by telephone at the time of interpretation on 05/13/2013 at 6:38 PM to Dr. Calvert Cantor , who verbally acknowledged these results.   Electronically Signed   By: Dereck Ligas M.D.   On: 05/13/2013 18:39   Dg Chest Port 1 View  05/15/2013   CLINICAL DATA:  Intubation, evaluate endotracheal tube position  EXAM: PORTABLE CHEST - 1 VIEW  COMPARISON:  Portable exam 0536 hr compared to 05/13/2013  FINDINGS: Rotated to the RIGHT.  Tip of endotracheal tube projects 4.8 cm above carina.  Nasogastric tube extends into stomach.  Enlargement of cardiac silhouette.  Mediastinal contours and pulmonary vascularity normal.  RIGHT basilar atelectasis versus consolidation.  Remaining lungs clear.  No pleural effusion or  pneumothorax.  IMPRESSION: Atelectasis versus consolidation at RIGHT lower lobe.   Electronically Signed   By: Lavonia Dana M.D.   On: 05/15/2013 07:54   Dg Chest Port 1 View  05/13/2013   CLINICAL DATA:  Post intubation  EXAM: PORTABLE CHEST - 1 VIEW  COMPARISON:  05/13/2013 at 1721 hr  FINDINGS: Endotracheal tube terminates 5 cm above the carina.  Lungs are essentially clear. No focal consolidation. No pleural effusion or pneumothorax.  Cardiomegaly.  Enteric tube terminates in the gastric cardia.  IMPRESSION: Endotracheal tube terminates 5 cm above the carina.   Electronically Signed   By: Julian Hy M.D.   On: 05/13/2013 18:05   Dg Chest Port 1 View  05/13/2013   CLINICAL DATA:  Seizure.  EXAM: PORTABLE CHEST - 1 VIEW  COMPARISON:  DG CHEST 2 VIEW dated 05/02/2013  FINDINGS: Cardiomegaly. Tortuous thoracic aorta. Aortic arch atherosclerosis. Monitoring leads project over the chest. Mild basilar atelectasis is present. No airspace disease. No effusion. Resection of the distal right clavicle.  IMPRESSION: Cardiomegaly and low lung volumes.  No acute  abnormality.   Electronically Signed   By: Dereck Ligas M.D.   On: 05/13/2013 17:47     CXR: rotated film, ett wnl  ASSESSMENT / PLAN:  PULMONARY A: VDRF P:   -SBT attempted, poor effort -repeat wean with PS 10, limit fentanyl -may need abg to ensure not alkalotic -will go ahead and drop rate 12 -will confirm DNI upon extubation  CARDIOVASCULAR A: A-fib on pradaxa, now ICH P:  - Hold anti-coag - sys goal 150, MAP 75, met -tele -DNR  RENAL A:  ARF, improved P:   - IVF , no free water - Monitor renal function again in am   GASTROINTESTINAL A:  No active issues. P:   - tolerating TF - lft in am wnl -ppi  HEMATOLOGIC / ONC A:  Likely metastatic melanoma.  Pradaxa for a-fib. P:  - repeat coags wnl -scd -prognosis poor -steroids, remain -will involve oncology  INFECTIOUS A:  No active issues. P:   - Monitor WBC  and fever curve.  ENDOCRINE A:  DM by history.   P:   - ISS.  NEUROLOGIC A:  Multiple sites of ICH.  Likely metastatic disease. P:   - Keppra. - Decadron, will reduce in am  - BP goals -EEG done Goals of care active discussions with PCCM  TODAY'S SUMMARY: wean attempts, DNR, will update brother and discuss DNI status, some improvement on steroids noted  I have personally obtained a history, examined the patient, evaluated laboratory and imaging results, formulated the assessment and plan and placed orders.  CRITICAL CARE: The patient is critically ill with multiple organ systems failure and requires high complexity decision making for assessment and support, frequent evaluation and titration of therapies, application of advanced monitoring technologies and extensive interpretation of multiple databases. Critical Care Time devoted to patient care services described in this note is 30 minutes.   Lavon Paganini. Titus Mould, MD, [place] Pgr: [facility]

## 2013-05-15 NOTE — Consult Note (Signed)
WOC has discussed care of patient's foot wound with plastic surgery team. Will dc VAC dressing for a time and switch to silver dressings to prevent further maceration of the wound.  Ok to follow up in the plastic surgery office at the time of discharge if appropriate on patient status.    WOC will follow and assess wound weekly while inpatient  [facility] RN,CWOCN [phone]

## 2013-05-15 NOTE — Procedures (Deleted)
Arterial Catheter Insertion Procedure Note Leviticus Harton [id number] 1926/05/20  Procedure: Insertion of Arterial Catheter  Indications: Blood pressure monitoring and Frequent blood sampling  Procedure Details Consent: Risks of procedure as well as the alternatives and risks of each were explained to the (patient/caregiver).  Consent for procedure obtained. Time Out: Verified patient identification, verified procedure, site/side was marked, verified correct patient position, special equipment/implants available, medications/allergies/relevent history reviewed, required imaging and test results available.  Performed  Maximum sterile technique was used including antiseptics, cap, gloves, gown, hand hygiene, mask and sheet. Skin prep: Chlorhexidine; local anesthetic administered 20 gauge catheter was inserted into right radial artery using the Seldinger technique.  Evaluation Blood flow good; BP tracing good. Complications: No apparent complications.   Raylene Miyamoto 05/15/2013   [place] Mukhtar Shams J. Titus Mould, MD, [place] Pgr: [facility]

## 2013-05-15 NOTE — Progress Notes (Signed)
Subjective: Much improved overnight  Exam: Filed Vitals:   05/15/13 0900  BP: 93/45  Pulse: 86  Temp:   Resp: 12   Gen: In bed, NAD MS: Opens eyes to voice, follows commands.  SA:YTKZSW corneals, pupils  Motor: follows commands x 4.  Sensory:as above.    Impression: 80 yo M with new onset seizure in the setting of hemorrhagic brain metastasis. In the setting of known metastatic melanoma, this is likely a primary.   He was on pradaxa for afib, now holding.   Recommendations: 1) Continue keppra 500mg  BID 2) oncology consult.  3) continue holding pradaxa.    Roland Rack, MD [facility] [phone]  If 7pm- 7am, please page neurology on call as listed in [place].

## 2013-05-15 NOTE — Progress Notes (Deleted)
Patient ID: Craig Martin, male   DOB: Mar 16, 1926, 80 y.o.   MRN: [id number]  Events,  Extubated: within 20 min , horrible airway control, tachy 177, rr 45, on 100% NRB, expiratory and insp sounds, no stridor overt Re intubated , did well Al ine placed, abg to repeat pcxr done Extensive time managing during above Ccm time now 85 min , including family updates  Lavon Paganini. Titus Mould, MD, [place] Pgr: [facility]  I

## 2013-05-15 NOTE — Consult Note (Signed)
Radiation Oncology         [phone]) [phone] ________________________________  Name: Craig Martin MRN: [id number]  Date: 05/13/2013  DOB: 1926/12/26  REFERRING PHYSICIAN: No ref. provider found   DIAGNOSIS: Melanoma with clinical evidence of likely brain metastasis  HISTORY OF PRESENT ILLNESS::Craig Martin is a 80 y.o. male who is seen for an initial consultation visit. The patient was admitted with tonic-clonic seizures and required intubation after presentation to the emergency department. The patient notably has a history of melanoma and underwent surgery on 10/21/2012. This showed invasive malignant melanoma involving the right foot and a positive right inguinal lymph node was also involved with carcinoma.  The patient proceeded to undergo a CT scan of the head without contrast on 05/13/2013. This demonstrated multifocal intracranial hemorrhage was scattered areas of apparent vasogenic edema as well. This was potentially consistent with a history of metastatic melanoma, representing hemorrhage secondary to metastasis. Another differential considerations included hemorrhagic infarction, amyloid angiopathy, and hypertensive hemorrhage.  The patient has remained intubated. Further discussions have been conducted with his family and at this time it appears that aggressive measures or not desired. The patient remains intubated and he failed 2 attempts at weaning him today.    PREVIOUS RADIATION THERAPY: No   PAST MEDICAL HISTORY:  has a past medical history of Glaucoma; Bradycardia; Diabetes mellitus; Hyperlipidemia; Hypertension; Shortness of breath; Stroke; BPH (benign prostatic hyperplasia); Difficulty voiding; Dysrhythmia; Critical lower limb ischemia; Melanoma; Arthritis; and Coronary artery disease.     PAST SURGICAL HISTORY: Past Surgical History  Procedure Laterality Date  . Stented coronary artery  2004  . Transurethral resection of prostate  01/24/2012    Procedure:  TRANSURETHRAL RESECTION OF THE PROSTATE WITH GYRUS INSTRUMENTS;  Surgeon: Alexis Frock, MD;  Location: [facility];  Service: Urology;  Laterality: N/A;  . Cardiac catheterization  09/19/2002    moderate LAD and RCA and normal LV function  . Nm myocar perf wall motion  05/24/2011    showed inferior scar w/o ischemia  . Doppler echocardiography  05/24/2011    EF >55%  . Doppler echocardiography  09/28/2009    EF >55% lvh w/ left atrial enlargement,diastolic dysfunctio,elevated lv filling pressure  . Nm myocar perf wall motion  09/09/2009    mild ischemia mid anterior,apical anterior and basal inferolateral  . Nm myocar perf wall motion  12/04/2002  . Nm myocar perf wall motion  05/21/2002  . Cardiac catheterization  07/03/2002  . Cataract extraction    . Melanoma excision Right 10/21/2012    Procedure: MELANOMA EXCISION;  Surgeon: Zenovia Jarred, MD;  Location: [facility];  Service: General;  Laterality: Right;  . Inguinal lymph node biopsy Right 10/21/2012    Procedure: INGUINAL LYMPH NODE BIOPSY;  Surgeon: Zenovia Jarred, MD;  Location: [facility];  Service: General;  Laterality: Right;  . Application of a-cell of extremity Right 10/21/2012    Procedure: APPLICATION OF A-CELL OF EXTREMITY;  Surgeon: Theodoro Kos, DO;  Location: [facility];  Service: Plastics;  Laterality: Right;  . Incision and drainage of wound Right 12/04/2012    Procedure: IRRIGATION AND DEBRIDEMENT RIGHT FOOT WOUND WITH PLACEMENT OF ACELL/VAC;  Surgeon: Theodoro Kos, DO;  Location: [facility];  Service: Plastics;  Laterality: Right;  . Esophagogastroduodenoscopy N/A 05/05/2013    Procedure: ESOPHAGOGASTRODUODENOSCOPY (EGD);  Surgeon: Jerene Bears, MD;  Location: [facility];  Service: Gastroenterology;  Laterality: N/A;     FAMILY HISTORY: family history includes Diabetes in his brother and mother.  SOCIAL HISTORY:  reports that he quit smoking about 27 years ago. His smoking use included Cigarettes. He smoked  1.50 packs per day. He has quit using smokeless tobacco. He reports that he does not drink alcohol or use illicit drugs.   ALLERGIES: Review of patient's allergies indicates no known allergies.   MEDICATIONS:  Current Facility-Administered Medications  Medication Dose Route Frequency Provider Last Rate Last Dose  . 0.9 %  sodium chloride infusion  250 mL Intravenous PRN Corey Harold, NP      . 0.9 %  sodium chloride infusion   Intravenous Continuous Corey Harold, NP 10 mL/hr at 05/14/13 2200    . antiseptic oral rinse (BIOTENE) solution 15 mL  15 mL Mouth Rinse QID Corey Harold, NP   15 mL at 05/15/13 1600  . chlorhexidine (PERIDEX) 0.12 % solution 15 mL  15 mL Mouth Rinse BID Corey Harold, NP   15 mL at 05/15/13 0757  . dexamethasone (DECADRON) injection 10 mg  10 mg Intravenous 4 times per day Rush Farmer, MD   10 mg at 05/15/13 1153  . feeding supplement (VITAL AF 1.2 CAL) liquid 1,000 mL  1,000 mL Per Tube Continuous Baird Lyons, RD 60 mL/hr at 05/15/13 1153 1,000 mL at 05/15/13 1153  . fentaNYL (SUBLIMAZE) injection 50-100 mcg  50-100 mcg Intravenous Q1H PRN Raylene Miyamoto, MD   100 mcg at 05/15/13 1611  . insulin aspart (novoLOG) injection 0-9 Units  0-9 Units Subcutaneous 6 times per day Raylene Miyamoto, MD   2 Units at 05/15/13 1621  . labetalol (NORMODYNE,TRANDATE) injection 10 mg  10 mg Intravenous Q4H PRN Raylene Miyamoto, MD      . levETIRAcetam (KEPPRA) 1,000 mg in sodium chloride 0.9 % 100 mL IVPB  1,000 mg Intravenous Q12H Roland Rack, MD   1,000 mg at 05/15/13 1027  . pantoprazole (PROTONIX) injection 40 mg  40 mg Intravenous Daily Corey Harold, NP   40 mg at 05/15/13 1027     REVIEW OF SYSTEMS:  A 15 point review of systems is documented in the electronic medical record. This was obtained by the nursing staff. However, I reviewed this with the patient to discuss relevant findings and make appropriate changes.  Pertinent items are noted in  HPI.    PHYSICAL EXAM:  weight is 178 lb 2.1 oz (80.8 kg). His axillary temperature is 97.9 F (36.6 C). His blood pressure is 98/57 and his pulse is 83. His respiration is 14 and oxygen saturation is 100%.   ECOG = 4, intubated  0 - Asymptomatic (Fully active, able to carry on all predisease activities without restriction)  1 - Symptomatic but completely ambulatory (Restricted in physically strenuous activity but ambulatory and able to carry out work of a light or sedentary nature. For example, light housework, office work)  2 - Symptomatic, <50% in bed during the day (Ambulatory and capable of all self care but unable to carry out any work activities. Up and about more than 50% of waking hours)  3 - Symptomatic, >50% in bed, but not bedbound (Capable of only limited self-care, confined to bed or chair 50% or more of waking hours)  4 - Bedbound (Completely disabled. Cannot carry on any self-care. Totally confined to bed or chair)  5 - Death   Eustace Pen MM, Creech RH, Tormey DC, et al. [phone]). "Toxicity and response criteria of the [facility]". [place] Oncol. 5 (6):  662-94  General: in no acute distress, intubated with feeding tube also present HEENT: Normocephalic, atraumatic Cardiovascular: Some irregularity noted, normal rate Respiratory: Clear to auscultation bilaterally GI: Soft, nontender, normal bowel sounds Neuro: Arousable    LABORATORY DATA:  Lab Results  Component Value Date   WBC 7.6 05/15/2013   HGB 8.6* 05/15/2013   HCT 27.2* 05/15/2013   MCV 88.0 05/15/2013   PLT 181 05/15/2013   Lab Results  Component Value Date   NA 141 05/15/2013   K 3.6* 05/15/2013   CL 106 05/15/2013   CO2 21 05/15/2013   Lab Results  Component Value Date   ALT 11 05/15/2013   AST 14 05/15/2013   ALKPHOS 44 05/15/2013   BILITOT 0.8 05/15/2013      RADIOGRAPHY: Dg Chest 2 View  05/02/2013   CLINICAL DATA:  Worsening shortness of breath.  Ex-smoker.  EXAM: CHEST  2  VIEW  COMPARISON:  01/11/2013.  FINDINGS: Stable enlarged cardiac silhouette. Small amount of airspace opacity at the posterior lung bases on the lateral view, most likely on left, medially on the frontal view. The remainder of the lungs are clear with normal vascularity. Mild thoracic spine degenerative changes. Mild central peribronchial thickening.  IMPRESSION: 1. Mild left lower lobe pneumonia or patchy atelectasis. 2. Mild bronchitic changes. 3. Cardiomegaly.   Electronically Signed   By: Gordan Payment M.D.   On: 05/02/2013 18:50   Ct Head Wo Contrast  05/13/2013   CLINICAL DATA:  Unresponsive patient with seizure activity.  EXAM: CT HEAD WITHOUT CONTRAST  TECHNIQUE: Contiguous axial images were obtained from the base of the skull through the vertex without intravenous contrast.  COMPARISON:  None.  FINDINGS: Multiple foci of intracranial hemorrhage are present with vasogenic edema. The largest is in the right frontal lobe, measuring 45 mm x 23 mm, with hematocrit layering dependently. Sulcal effacement is present in the right frontal lobe associated with edema and hemorrhage.  There is no midline shift. Another area hemorrhage is present in the periventricular left temporal lobe, with intraventricular break through and blood layering within the occipital horn of the left lateral ventricle. There is thickening of the tentorium suggesting subdural hemorrhage however this may simply represent tentorial calcification.  There is a low-attenuation region in the posterior right frontal lobe without hemorrhage. This could represent old ischemia/infarction or vasogenic edema associated with metastatic disease.  Another low-attenuation lesion is present in the inferior right occipital lobe. There is some hemorrhage layering within the occipital horn of the right lateral ventricle is well. No definite posterior fossa lesions.  The suprasellar cistern remains patent. Dense intracranial atherosclerosis. Endotracheal and  orogastric intubation is evident on the lateral view. Paranasal sinuses appear within normal limits. Dense intracranial atherosclerosis.  IMPRESSION: 1. Multifocal intracranial hemorrhage with largest areas of bleeding in the right frontal and left temporal lobe and blood in the lateral ventricles. 2. Scattered areas of nonhemorrhagic areas of low attenuation suggest vasogenic edema potentially associated with metastatic disease. Patient has a history of melanoma and metastatic melanoma could produce multiple hemorrhagic metastases. Other differential considerations include hemorrhagic infarction, hypertensive hemorrhage and amyloid angiopathy. 3. Followup MRI with the without contrast is recommended to further assess the cause of hemorrhage. 4. Critical Value/emergent results were called by telephone at the time of interpretation on 05/13/2013 at 6:38 PM to Dr. Susy Frizzle , who verbally acknowledged these results.   Electronically Signed   By: Andreas Newport M.D.   On: 05/13/2013 18:39  Dg Chest Port 1 View  05/15/2013   CLINICAL DATA:  Intubation, evaluate endotracheal tube position  EXAM: PORTABLE CHEST - 1 VIEW  COMPARISON:  Portable exam 0536 hr compared to 05/13/2013  FINDINGS: Rotated to the RIGHT.  Tip of endotracheal tube projects 4.8 cm above carina.  Nasogastric tube extends into stomach.  Enlargement of cardiac silhouette.  Mediastinal contours and pulmonary vascularity normal.  RIGHT basilar atelectasis versus consolidation.  Remaining lungs clear.  No pleural effusion or pneumothorax.  IMPRESSION: Atelectasis versus consolidation at RIGHT lower lobe.   Electronically Signed   By: Lavonia Dana M.D.   On: 05/15/2013 07:54   Dg Chest Port 1 View  05/13/2013   CLINICAL DATA:  Post intubation  EXAM: PORTABLE CHEST - 1 VIEW  COMPARISON:  05/13/2013 at 1721 hr  FINDINGS: Endotracheal tube terminates 5 cm above the carina.  Lungs are essentially clear. No focal consolidation. No pleural effusion or  pneumothorax.  Cardiomegaly.  Enteric tube terminates in the gastric cardia.  IMPRESSION: Endotracheal tube terminates 5 cm above the carina.   Electronically Signed   By: Julian Hy M.D.   On: 05/13/2013 18:05   Dg Chest Port 1 View  05/13/2013   CLINICAL DATA:  Seizure.  EXAM: PORTABLE CHEST - 1 VIEW  COMPARISON:  DG CHEST 2 VIEW dated 05/02/2013  FINDINGS: Cardiomegaly. Tortuous thoracic aorta. Aortic arch atherosclerosis. Monitoring leads project over the chest. Mild basilar atelectasis is present. No airspace disease. No effusion. Resection of the distal right clavicle.  IMPRESSION: Cardiomegaly and low lung volumes.  No acute abnormality.   Electronically Signed   By: Dereck Ligas M.D.   On: 05/13/2013 17:47       IMPRESSION: The patient has a history of melanoma and has current CT evidence of intracranial hemorrhage. This very well is secondary to multiple intracranial metastases which would be consistent with the findings including edema. The patient has been started on seizure medication and Decadron. He remains intubated. His prognosis unfortunately is quite poor. The family has expressed a desire for comfort measures at this time.  There does not appear to be a role for urgent radiation treatment at this time. Mrs. in keeping with the family's wishes in addition to his current clinical status. This is also consistent with uncertainty surrounding his noncontrast CT scan of the head. MRI scan of the brain would be warranted with aggressive management but this cannot be carried out at this time.  If the patient status work to markedly improved, then further workup including brain MRI scan and conceivably radiation treatment could be entertained. Unfortunately, this does not appear likely given the patient's status and presentation at this time.   PLAN: I will continue to follow patient. If the patient status improves and radiation is a possibility then I would be happy to see the  patient once again.      ________________________________   Jodelle Gross, MD, PhD   **Disclaimer: This note was dictated with voice recognition software. Similar sounding words can inadvertently be transcribed and this note may contain transcription errors which may not have been corrected upon publication of note.**

## 2013-05-15 NOTE — Progress Notes (Signed)
Patient ID: Davione Lenker, male   DOB: Dec 27, 1926, 80 y.o.   MRN: [id number] Extensive discussion with family eddie, significant other and "son". We discussed the poor prognosis and likely poor quality of life. Family has decided to offer full comfort care. They are aware that the patient may be transferred to palliative care floor for continued comfort care needs. They have been fully updated on the process and expectations.  Plan 3 pm Friday  Lavon Paganini. Titus Mould, MD, [place] Pgr: [facility]

## 2013-05-16 LAB — GLUCOSE, CAPILLARY
GLUCOSE-CAPILLARY: 245 mg/dL — AB (ref 70–99)
Glucose-Capillary: 214 mg/dL — ABNORMAL HIGH (ref 70–99)
Glucose-Capillary: 227 mg/dL — ABNORMAL HIGH (ref 70–99)
Glucose-Capillary: 255 mg/dL — ABNORMAL HIGH (ref 70–99)

## 2013-05-16 MED ORDER — MORPHINE BOLUS VIA INFUSION
5.0000 mg | INTRAVENOUS | Status: DC | PRN
  Filled 2013-05-16: qty 20

## 2013-05-16 MED ORDER — DEXAMETHASONE SODIUM PHOSPHATE 10 MG/ML IJ SOLN: 6.0000 mg | Freq: Four times a day (QID) | INTRAMUSCULAR | Status: DC

## 2013-05-16 MED ORDER — DEXAMETHASONE SODIUM PHOSPHATE 4 MG/ML IJ SOLN
4.0000 mg | Freq: Four times a day (QID) | INTRAMUSCULAR | Status: DC
  Administered 2013-05-16 – 2013-05-18 (×9): 4 mg via INTRAVENOUS
  Filled 2013-05-16 (×10): qty 1

## 2013-05-16 MED ORDER — DEXAMETHASONE SODIUM PHOSPHATE 10 MG/ML IJ SOLN
4.0000 mg | Freq: Three times a day (TID) | INTRAMUSCULAR | Status: DC
  Administered 2013-05-16: 15:00:00 via INTRAVENOUS
  Administered 2013-05-16: 4 mg via INTRAVENOUS

## 2013-05-16 MED ORDER — MORPHINE SULFATE 10 MG/ML IJ SOLN
0.0000 mg/h | INTRAVENOUS | Status: DC
  Filled 2013-05-16: qty 10

## 2013-05-16 MED ORDER — SODIUM CHLORIDE 0.9 % IV SOLN
1000.0000 mg | Freq: Two times a day (BID) | INTRAVENOUS | Status: DC
  Administered 2013-05-16 – 2013-05-18 (×4): 1000 mg via INTRAVENOUS
  Filled 2013-05-16 (×5): qty 10

## 2013-05-16 MED ORDER — WHITE PETROLATUM GEL
Status: AC
  Administered 2013-05-16: 17:00:00
  Filled 2013-05-16: qty 5

## 2013-05-16 NOTE — Progress Notes (Signed)
A Palliative Medicine Consult has been received for Patient [id number] Craig Martin      DOB: [birth year]      OVF:[id number].We are attempting to respond as quickly as possible to your patient and families needs, however, due to high volume or provider shortage we have not been able to service you in a more timely manner.   We will get this scheduled as quickly as possible.  If in the meantime we can assist with symptom management or other questions via phone please do not hesitate to call our PMT cell phone [phone].  Noted extubation at 3 pm.  Other families scheduled through that time will attempt to accommodate.  We are sorry for the delay,   Farhana Fellows L. Lovena Le, MD MBA The Palliative Medicine Team at [facility] Phone: [phone] Pager: [phone]

## 2013-05-16 NOTE — Progress Notes (Signed)
[facility] Progress Note Patient Name: Keygan Dumond DOB: August 19, 1926 MRN: [id number]  Date of Service  05/16/2013   HPI/Events of Note     eICU Interventions  Wrote orders for extubation for comfort. Morphine ready but will not be started unless he shows signs discomfort   Intervention Category Intermediate Interventions: Other:  Collene Gobble 05/16/2013, 3:52 PM

## 2013-05-16 NOTE — Progress Notes (Signed)
Subjective: PAtient status changed to fulll comfort care.   Exam: Filed Vitals:   05/16/13 1620  BP:   Pulse: 92  Temp:   Resp: 12   Gen: In bed, NAD MS: Awake, alert, appears comfortable.    Impression: 80 yo M with seizures in the setting of hemorrhagic metastatic lesions. I agress that the cahnge to comfort measures would be reasonable if no chemo or radiation is planned. At this time, I would favor continuing decadron and keppra as comfort measures. If he is transferred to hospice unit or   Recommendations: 1) decadron 4mg  PO Q6H 2) keppra 1gm BID 3) will continue to follow.   Roland Rack, MD [facility] [phone]  If 7pm- 7am, please page neurology on call as listed in [place].

## 2013-05-16 NOTE — Progress Notes (Signed)
Received call from [facility], [facility], that this patient was an active patient with them prior to admission. He had RN, PT, OT, at least as her message said that he had the "full package of care".  Will need orders to resume previous home care when he discharges, if home is the dispo.

## 2013-05-16 NOTE — Progress Notes (Signed)
Patient Craig Martin      DOB: Dec 14, 1926      ZVJ:[id number]  Patient extubated this afternoon tolerated it well. Family have all left. Patient stable. Will contact brother to schedule further  goals of care for tomorrow.   Endre Coutts L. Lovena Le, MD MBA The Palliative Medicine Team at [facility] Phone: [phone] Pager: [phone]

## 2013-05-16 NOTE — Progress Notes (Signed)
Pt without any distress, morphine gtt returned to main pharmacy, to [place], [place]. Tech.

## 2013-05-16 NOTE — H&P (Signed)
PULMONARY / CRITICAL CARE MEDICINE   Name: Craig Martin MRN: [id number] DOB: May 19, 1926    ADMISSION DATE:  05/13/2013 CONSULTATION DATE:  05/13/2013  REFERRING MD :  EDP PRIMARY SERVICE: PCCM  CHIEF COMPLAINT:  Unresponsive  BRIEF PATIENT DESCRIPTION: 80 year old male with PMH of metastatic melanoma met brain ICH.  SIGNIFICANT EVENTS / STUDIES:  5/12 intubated in ED. 5/13- DNR established 5/14- poor weaning effort 5/14- dnr established, extubation planned with DNI, comfort if fails 5/15- weaning improved  LINES / TUBES: ETT 5/12>>> PIV  CULTURES: None  ANTIBIOTICS: None  SUBJECTIVE: weaning well  VITAL SIGNS: Temp:  [97 F (36.1 C)-98 F (36.7 C)] 97.6 F (36.4 C) (05/15 0733) Pulse Rate:  [45-96] 87 (05/15 0900) Resp:  [12-21] 21 (05/15 0900) BP: (86-132)/(40-79) 124/79 mmHg (05/15 0900) SpO2:  [100 %] 100 % (05/15 0900) FiO2 (%):  [30 %] 30 % (05/15 0733) Weight:  [84.1 kg (185 lb 6.5 oz)] 84.1 kg (185 lb 6.5 oz) (05/15 0410) HEMODYNAMICS:   VENTILATOR SETTINGS: Vent Mode:  [-] PSV;CPAP FiO2 (%):  [30 %] 30 % Set Rate:  [12 bmp-14 bmp] 14 bmp Vt Set:  [530 mL] 530 mL PEEP:  [5 cmH20] 5 cmH20 Pressure Support:  [5 cmH20] 5 cmH20 Plateau Pressure:  [12 cmH20-14 cmH20] 12 cmH20 INTAKE / OUTPUT: Intake/Output     05/14 0701 - 05/15 0700 05/15 0701 - 05/16 0700   I.V. (mL/kg) 240 (2.9) 20 (0.2)   NG/GT 1440 120   IV Piggyback 1486.7    Total Intake(mL/kg) 3166.7 (37.7) 140 (1.7)   Urine (mL/kg/hr) 540 (0.3) 75 (0.4)   Drains 25 (0)    Total Output 565 75   Net +2601.7 +65        Stool Occurrence 1 x      PHYSICAL EXAMINATION: General:  Chronically ill appearing male, intubated Neuro:  Follows commands, lethargic HEENT:  [place]/AT, PERRL, gag wnl Cardiovascular:  IRIR, Nl S1/S2 Lungs:  CTA. Abdomen:  Soft, NT, ND and +BS. Musculoskeletal:  Slight edema wound wrapped Skin:  Intact.  LABS:  CBC  Recent Labs Lab 05/13/13 1715 05/14/13 0215  05/15/13 0137  WBC 7.7 6.4 7.6  HGB 8.9* 8.8* 8.6*  HCT 28.6* 28.0* 27.2*  PLT 205 176 181   Coag's  Recent Labs Lab 05/13/13 1715 05/15/13 0137  APTT 29 39*  INR 1.23 1.28   BMET  Recent Labs Lab 05/13/13 1715 05/14/13 0215 05/15/13 0137  NA 139 137 141  K 3.3* 3.8 3.6*  CL 105 106 106  CO2 23 19 21   BUN 23 22 21   CREATININE 1.60* 1.44* 1.39*  GLUCOSE 177* 101* 186*   Electrolytes  Recent Labs Lab 05/13/13 1715 05/14/13 0215 05/15/13 0137  CALCIUM 7.8* 7.9* 8.3*  MG  --  1.5  --   PHOS  --  3.1  --    Sepsis Markers  Recent Labs Lab 05/13/13 1728  LATICACIDVEN 2.16   ABG  Recent Labs Lab 05/13/13 1844 05/14/13 0350 05/15/13 1505  PHART 7.308* 7.488* 7.409  PCO2ART 49.1* 28.5* 36.3  PO2ART 469.0* 168.0* 110.0*   Liver Enzymes  Recent Labs Lab 05/13/13 1715 05/15/13 0137  AST 16 14  ALT 13 11  ALKPHOS 47 44  BILITOT 0.8 0.8  ALBUMIN 2.3* 1.9*   Cardiac Enzymes  Recent Labs Lab 05/13/13 1715  TROPONINI <0.30   Glucose  Recent Labs Lab 05/15/13 1205 05/15/13 1618 05/15/13 1949 05/15/13 2339 05/16/13 0321 05/16/13 [place]  155* 192* 203* 197* 245* 227*    Imaging Dg Chest Port 1 View  05/15/2013   CLINICAL DATA:  Intubation, evaluate endotracheal tube position  EXAM: PORTABLE CHEST - 1 VIEW  COMPARISON:  Portable exam 0536 hr compared to 05/13/2013  FINDINGS: Rotated to the RIGHT.  Tip of endotracheal tube projects 4.8 cm above carina.  Nasogastric tube extends into stomach.  Enlargement of cardiac silhouette.  Mediastinal contours and pulmonary vascularity normal.  RIGHT basilar atelectasis versus consolidation.  Remaining lungs clear.  No pleural effusion or pneumothorax.  IMPRESSION: Atelectasis versus consolidation at RIGHT lower lobe.   Electronically Signed   By: Lavonia Dana M.D.   On: 05/15/2013 07:54     CXR: none new  ASSESSMENT / PLAN:  PULMONARY A: VDRF P:   -SBT attempted, much improved, rr 23, tv ok,  will updated family that morphine infusion likely not needed -extubation to O2 and support planned 3-4 pm, drip comfort if fails  CARDIOVASCULAR A: A-fib on pradaxa, now ICH P:  - Hold anti-coag permenently - no pressors under any circumstances -tele -DNR  RENAL A:  ARF, improved P:   - IVF , no free water - kvo  GASTROINTESTINAL A:  On TF P:   - tolerating TF, hold at noon for extubation -ppi  HEMATOLOGIC / ONC A:  Likely metastatic melanoma.  Pradaxa for a-fib. P:  -scd -prognosis poor,  -candidacy unlikely for treatment , see onc note -steroids, remain  INFECTIOUS A:  No active issues. P:   - Monitor WBC and fever curve.  ENDOCRINE A:  DM by history.   P:   - ISS.  NEUROLOGIC A:  Multiple sites of ICH.  Likely metastatic disease. P:   - Keppra. - Decadron, will reduce PCCM  TODAY'S SUMMARY: weaning better, extubation today at 3 with family at bedside, comfort if fails  I have personally obtained a history, examined the patient, evaluated laboratory and imaging results, formulated the assessment and plan and placed orders.  CRITICAL CARE: The patient is critically ill with multiple organ systems failure and requires high complexity decision making for assessment and support, frequent evaluation and titration of therapies, application of advanced monitoring technologies and extensive interpretation of multiple databases. Critical Care Time devoted to patient care services described in this note is 30 minutes.   Lavon Paganini. Titus Mould, MD, [place] Pgr: [facility]

## 2013-05-16 NOTE — Procedures (Signed)
Extubation Procedure Note  Patient Details:   Name: Craig Martin DOB: 10-27-1926 MRN: [id number]   Airway Documentation:     Evaluation  O2 sats: stable throughout Complications: No apparent complications Patient did tolerate procedure well. Bilateral Breath Sounds: Rhonchi Suctioning: Airway Yes Patient tolerated wean. MD ordered to terminally extubate per family request. Positive for cuff leak. Patient extubated to a 2 Lpm nasal cannula. No signs of dyspnea or stridor. Patient resting comfortably. Family at bedside.  Orene Desanctis 05/16/2013, 4:33 PM

## 2013-05-16 NOTE — Progress Notes (Signed)
Craig Martin   DOB:[birth year]   QP#:[id number]   ZTI#:[id number]  Subjective:  Patient remains intubated. on IV steroids and anti-seizure medications  Objective:  Filed Vitals:   05/16/13 0900  BP: 124/79  Pulse: 87  Temp:   Resp: 21    Body mass index is 27.37 kg/(m^2).  Intake/Output Summary (Last 24 hours) at 05/16/13 1059 Last data filed at 05/16/13 0900  Gross per 24 hour  Intake 2096.67 ml  Output    570 ml  Net 1526.67 ml    GENERAL:intubated and follow some commands SKIN: skin color, texture, turgor are normal, no rashes. Right foot care as per WOC  EYES: closed, did not disturb  OROPHARYNX: patient intubated  NECK: supple, thyroid normal size, non-tender, without nodularity  LYMPH: no palpable lymphadenopathy in the cervical, axillary or inguinal  LUNGS: clear to auscultation and percussion with normal breathing effort  HEART: irregular rate & rhythm and no murmurs and no lower extremity edema  ABDOMEN:abdomen soft, non-tender and normal bowel sounds  Musculoskeletal:no cyanosis of digits and no clubbing . Right foot wrapped in NEURO:follows very simple commands. Intubated    CBG (last 3)   Recent Labs  05/15/13 2339 05/16/13 0321 05/16/13 0752  GLUCAP 197* 245* 227*     Labs:  Lab Results  Component Value Date   WBC 7.6 05/15/2013   HGB 8.6* 05/15/2013   HCT 27.2* 05/15/2013   MCV 88.0 05/15/2013   PLT 181 05/15/2013   NEUTROABS 6.8 05/15/2013    @LASTCHEMISTRY @  Urine Studies No results found for this basename: UACOL, UAPR, USPG, UPH, UTP, UGL, UKET, UBIL, UHGB, UNIT, UROB, ULEU, UEPI, UWBC, URBC, UBAC, CAST, CRYS, UCOM, BILUA,  in the last 72 hours  Basic Metabolic Panel:  Recent Labs Lab 05/13/13 1715 05/14/13 0215 05/15/13 0137  NA 139 137 141  K 3.3* 3.8 3.6*  CL 105 106 106  CO2 23 19 21   GLUCOSE 177* 101* 186*  BUN 23 22 21   CREATININE 1.60* 1.44* 1.39*  CALCIUM 7.8* 7.9* 8.3*  MG  --  1.5  --   PHOS  --  3.1  --    GFR The  CrCl is unknown because both a height and weight (above a minimum accepted value) are required for this calculation. Liver Function Tests:  Recent Labs Lab 05/13/13 1715 05/15/13 0137  AST 16 14  ALT 13 11  ALKPHOS 47 44  BILITOT 0.8 0.8  PROT 8.0 7.3  ALBUMIN 2.3* 1.9*   No results found for this basename: LIPASE, AMYLASE,  in the last 168 hours No results found for this basename: AMMONIA,  in the last 168 hours Coagulation profile  Recent Labs Lab 05/13/13 1715 05/15/13 0137  INR 1.23 1.28    CBC:  Recent Labs Lab 05/13/13 1715 05/14/13 0215 05/15/13 0137  WBC 7.7 6.4 7.6  NEUTROABS 3.6  --  6.8  HGB 8.9* 8.8* 8.6*  HCT 28.6* 28.0* 27.2*  MCV 89.4 88.9 88.0  PLT 205 176 181   Cardiac Enzymes:  Recent Labs Lab 05/13/13 1715  TROPONINI <0.30   BNP: No components found with this basename: POCBNP,  CBG:  Recent Labs Lab 05/15/13 1618 05/15/13 1949 05/15/13 2339 05/16/13 0321 05/16/13 0752  GLUCAP 192* 203* 197* 245* 227*   D-Dimer No results found for this basename: DDIMER,  in the last 72 hours Hgb A1c No results found for this basename: HGBA1C,  in the last 72 hours Lipid Profile No results found for this basename:  CHOL, HDL, LDLCALC, TRIG, CHOLHDL, LDLDIRECT,  in the last 72 hours Thyroid function studies No results found for this basename: TSH, T4TOTAL, FREET3, T3FREE, THYROIDAB,  in the last 72 hours Anemia work up No results found for this basename: VITAMINB12, FOLATE, FERRITIN, TIBC, IRON, RETICCTPCT,  in the last 72 hours Microbiology Recent Results (from the past 240 hour(s))  MRSA PCR SCREENING     Status: None   Collection Time    05/13/13  8:59 PM      Result Value Ref Range Status   MRSA by PCR NEGATIVE  NEGATIVE Final   Comment:            The GeneXpert MRSA Assay (FDA     approved for NASAL specimens     only), is one component of a     comprehensive MRSA colonization     surveillance program. It is not     intended to  diagnose MRSA     infection nor to guide or     monitor treatment for     MRSA infections.      Studies:  Dg Chest Port 1 View  05/15/2013   CLINICAL DATA:  Intubation, evaluate endotracheal tube position  EXAM: PORTABLE CHEST - 1 VIEW  COMPARISON:  Portable exam 0536 hr compared to 05/13/2013  FINDINGS: Rotated to the RIGHT.  Tip of endotracheal tube projects 4.8 cm above carina.  Nasogastric tube extends into stomach.  Enlargement of cardiac silhouette.  Mediastinal contours and pulmonary vascularity normal.  RIGHT basilar atelectasis versus consolidation.  Remaining lungs clear.  No pleural effusion or pneumothorax.  IMPRESSION: Atelectasis versus consolidation at RIGHT lower lobe.   Electronically Signed   By: Lavonia Dana M.D.   On: 05/15/2013 07:54    Assessmnent/Plan:80 y.o. male with   1.History of melanoma   2. Metastatic hemorrhagic lesions, suspected metastatic melanoma  Initially diagnosed from surgical resection from a right foot lesion and right inguinal lymph node biopsy. He is being admitted with seizures and found per CT brain to have hemorrhagic metastatic brain lesions. He is not a chemotherapy candidate with his poor performance status and multiple commorbidities.  Appreciate radiation oncology including the case. Palliative care has been consulted. Continue supportive  therapy with IV steroids and antiseizure medications  Plans were made for extubation around 3 PM today  Patient is DNR.   3. History of Atrial Fibrillation  Pradaxa is on hold due to hemorrhagic lesions.  Other medical issues as per admitting team    Wilmon Arms, MD Medical oncology  05/16/2013  10:59 AM

## 2013-05-16 NOTE — Consult Note (Signed)
WOC wound follow up Wound type: surgical site and right heel ulcer s/p melanoma removal Wound bed: both are clean but the periwound is macerated, for that reason will give the foot a break from the [facility] for a while to see if the maceration will improve.  Drainage (amount, consistency, odor) minimal in canister, but strong odor from dressing. Periwound: intact but with maceration Dressing procedure/placement/frequency: Removed VAC dressing today.  Cut to fit silver hydrofiber and placed over the wounds of the right lateral foot.  Cleaned the foot extensively with soap and water and dried.  Topped with dry gauze and wrap with kerlix. Change every other day.   WOC will follow along with you for weekly wound assessments  Cherae Marton Liane Comber RN,CWOCN [phone]

## 2013-05-17 DIAGNOSIS — I619 Nontraumatic intracerebral hemorrhage, unspecified: Principal | ICD-10-CM

## 2013-05-17 DIAGNOSIS — C439 Malignant melanoma of skin, unspecified: Secondary | ICD-10-CM

## 2013-05-17 DIAGNOSIS — C4370 Malignant melanoma of unspecified lower limb, including hip: Secondary | ICD-10-CM

## 2013-05-17 NOTE — Progress Notes (Signed)
Patient RV:IFBPPH [name]      DOB: June 14, 1926      KFE:[id number]  Left message to catch up with patients' brother awaiting call back to help affirm goals of care . Will see as soon as he gets back with me.  Treesa Mccully L. Lovena Le, MD MBA The Palliative Medicine Team at [facility] Phone: [phone] Pager: [phone]

## 2013-05-17 NOTE — Progress Notes (Signed)
PULMONARY / CRITICAL CARE MEDICINE   Name: Craig Martin MRN: [id number] DOB: 12-28-1926    ADMISSION DATE:  05/13/2013 CONSULTATION DATE:  05/13/2013  REFERRING MD :  EDP PRIMARY SERVICE: PCCM  CHIEF COMPLAINT:  Unresponsive  BRIEF PATIENT DESCRIPTION: 80 year old male with PMH of metastatic melanoma met brain ICH.  SIGNIFICANT EVENTS / STUDIES:  5/12 intubated in ED. 5/13- DNR established 5/14- poor weaning effort 5/14- dnr established, extubation planned with DNI, comfort if fails 5/15- weaning improved, extubated , comfort care   LINES / TUBES: ETT 5/12>>>5/15 PIV  CULTURES: None  ANTIBIOTICS: None  SUBJECTIVE:  Extubated 5/15, full comfort care.  Awake and alert this am,  sats 100% on RA Following commands Wants to eat   VITAL SIGNS: Temp:  [97.9 F (36.6 C)-98.4 F (36.9 C)] 98.4 F (36.9 C) (05/15 2300) Pulse Rate:  [77-100] 83 (05/16 0800) Resp:  [12-26] 23 (05/16 0800) BP: (101-130)/(46-89) 101/46 mmHg (05/16 0600) SpO2:  [94 %-100 %] 99 % (05/16 0800) FiO2 (%):  [30 %] 30 % (05/15 1120) Weight:  [183 lb 3.2 oz (83.1 kg)] 183 lb 3.2 oz (83.1 kg) (05/16 0600) HEMODYNAMICS:   VENTILATOR SETTINGS: Vent Mode:  [-] PSV;CPAP FiO2 (%):  [30 %] 30 % PEEP:  [5 cmH20] 5 cmH20 Pressure Support:  [8 cmH20] 8 cmH20 INTAKE / OUTPUT: Intake/Output     05/15 0701 - 05/16 0700 05/16 0701 - 05/17 0700   I.V. (mL/kg) 140 (1.7)    NG/GT 120    IV Piggyback 110    Total Intake(mL/kg) 370 (4.5)    Urine (mL/kg/hr) 850 (0.4)    Drains     Total Output 850     Net -480            PHYSICAL EXAMINATION: General:  Chronically ill appearing male,  Neuro:  Follows commands, alert /oriented  HEENT:  [place]/AT, PERRL, Cardiovascular:  IRIR, Nl S1/S2 Lungs:  CTA. Abdomen:  Soft, NT, ND and +BS. Musculoskeletal:  Slight edema wound wrapped Skin:  Intact.  LABS:  CBC  Recent Labs Lab 05/13/13 1715 05/14/13 0215 05/15/13 0137  WBC 7.7 6.4 7.6  HGB 8.9*  8.8* 8.6*  HCT 28.6* 28.0* 27.2*  PLT 205 176 181   Coag's  Recent Labs Lab 05/13/13 1715 05/15/13 0137  APTT 29 39*  INR 1.23 1.28   BMET  Recent Labs Lab 05/13/13 1715 05/14/13 0215 05/15/13 0137  NA 139 137 141  K 3.3* 3.8 3.6*  CL 105 106 106  CO2 23 19 21   BUN 23 22 21   CREATININE 1.60* 1.44* 1.39*  GLUCOSE 177* 101* 186*   Electrolytes  Recent Labs Lab 05/13/13 1715 05/14/13 0215 05/15/13 0137  CALCIUM 7.8* 7.9* 8.3*  MG  --  1.5  --   PHOS  --  3.1  --    Sepsis Markers  Recent Labs Lab 05/13/13 1728  LATICACIDVEN 2.16   ABG  Recent Labs Lab 05/13/13 1844 05/14/13 0350 05/15/13 1505  PHART 7.308* 7.488* 7.409  PCO2ART 49.1* 28.5* 36.3  PO2ART 469.0* 168.0* 110.0*   Liver Enzymes  Recent Labs Lab 05/13/13 1715 05/15/13 0137  AST 16 14  ALT 13 11  ALKPHOS 47 44  BILITOT 0.8 0.8  ALBUMIN 2.3* 1.9*   Cardiac Enzymes  Recent Labs Lab 05/13/13 1715  TROPONINI <0.30   Glucose  Recent Labs Lab 05/15/13 1949 05/15/13 2339 05/16/13 0321 05/16/13 0752 05/16/13 1157 05/16/13 2344  GLUCAP 203* 197* 245* 227* 255*  214*    Imaging No results found.   CXR: none new  ASSESSMENT / PLAN:  PULMONARY A: VDRF>extubated 5/15  >sats adequate on RA  P:   Wean O2 to off for sats >90% Transfer to floor   CARDIOVASCULAR A: A-fib on pradaxa, now ICH P:  - Hold anti-coag permenently -DNR  RENAL A:  ARF, improved P:   No labs indicated   GASTROINTESTINAL A:  On TF P:    diet as tolerated    HEMATOLOGIC / ONC A:  Likely metastatic melanoma.  Pradaxa for a-fib. P:  -prognosis poor,  -D/c scd  -comfort care    INFECTIOUS A:  No active issues. P:   -no labs /limit IV sticks   ENDOCRINE A:  DM by history.   P:   - d/c cbg   NEUROLOGIC A:  Multiple sites of ICH.  Likely metastatic disease. P:   - Keppra. - Decadron  TODAY'S SUMMARY:  Pt extubated and doing well  Transfer to pallative bed .    Tammy  Parrett NP-C  [facility]  [phone]    STAFF MD Seen in palliative unit. Appears comfortable.  RN says intermittently oriented. Some sleepy. Rest per NP note. Appreciate palliative, neuro and TRH services. Will leave care to them. PCCM will sign off as of 05/18/13  Thanks  Dr. Brand Males, M.D., F.C.C.P Pulmonary and Critical Care Medicine Staff Physician [facility] Pager: [phone], If no answer or between  15:00h - 7:00h: call [phone]  05/17/2013 6:58 PM

## 2013-05-17 NOTE — Progress Notes (Signed)
Subjective: PAtient is awake and alert. He did not seem aware of his metastatic diagnosis, and I discussed with him that he had cancer spread to muliple areas, including the brain.   Exam: Filed Vitals:   05/17/13 0800  BP:   Pulse: 83  Temp:   Resp: 23   Gen: In bed, NAD MS: Awake, alert, oeriented to place, year, gives month as "June" CB:SWHQP, eomi Motor: MAEW Sensory:intact to LT  Impression: 80 yo M with seizure in teh setting of intracranial metastasis. I agree with discussions regarding goals of care, and palliative care is following along. I would favor continuing decadron and keppra pending his involvement in these discussion. Oncology state that he is not a chemo candidate, radiation may be an option.   Recommendations: 1) Agree with discussions regarding goals of care.  2) As per rad onc note, if patient wishes to pursue radiation, either   I discussed code status with the patient and he agrees that with his spread cancer, he would not want a breathing tube for respiratory insufficiency and would not want chest compressions in the event of a cardiac arrest. Patient remains DNR.   Roland Rack, MD [facility] [phone]  If 7pm- 7am, please page neurology on call as listed in [place].

## 2013-05-17 NOTE — Progress Notes (Signed)
Patient transferred to [facility] room 17 via hospital bed.  Pt reports no c/o pain at this time.  Dressing intact on Rt foot.  VSS.  Oriented pt to call bell and room.  Some slight confusion noted.  No family at bedside at this time.  Will cont to monitor.

## 2013-05-18 DIAGNOSIS — Z515 Encounter for palliative care: Secondary | ICD-10-CM

## 2013-05-18 LAB — GLUCOSE, CAPILLARY: GLUCOSE-CAPILLARY: 207 mg/dL — AB (ref 70–99)

## 2013-05-18 MED ORDER — LEVETIRACETAM 500 MG PO TABS
1000.0000 mg | ORAL_TABLET | Freq: Two times a day (BID) | ORAL | Status: DC
  Administered 2013-05-18 – 2013-05-20 (×5): 1000 mg via ORAL
  Filled 2013-05-18 (×7): qty 2

## 2013-05-18 MED ORDER — DEXAMETHASONE SODIUM PHOSPHATE 4 MG/ML IJ SOLN
4.0000 mg | Freq: Two times a day (BID) | INTRAMUSCULAR | Status: DC
Start: 2013-05-19 — End: 2013-05-19
  Administered 2013-05-19: 4 mg via INTRAVENOUS
  Filled 2013-05-18 (×2): qty 1

## 2013-05-18 NOTE — Progress Notes (Addendum)
Subjective: No further seizures at this time.  Exam: Filed Vitals:   05/18/13 1340  BP: 132/78  Pulse: 86  Temp: 97.8 F (36.6 C)  Resp: 22   Gen: In bed, NAD MS: Awake, alert, oeriented to place, year,  ZJ:IRCVE, eomi Motor: MAEW Sensory:intact to LT  Impression: 80 yo M with seizure in teh setting of intracranial metastasis. I agree with discussions regarding goals of care, and palliative care is following along. I would favor continuing decadron and keppra pending his involvement in these discussion. Oncology state that he is not a chemo candidate, radiation may be an option.   Recommendations: 1) Agree with discussions regarding goals of care.  2) continue Keppra at current dose 3) will likely need to wean steroids, I will decrease to 4 mg twice a day  Roland Rack, MD [facility] [phone]  If 7pm- 7am, please page neurology on call as listed in [place].

## 2013-05-18 NOTE — Consult Note (Signed)
Patient Craig Martin      DOB: 09/08/1926      WER:[id number]     Consult Note from the Palliative Medicine Team at [place] Requested by: Dr. Doyle Askew    PCP: [name], MD Reason for Consultation: [place]     Phone Number:[phone]  Assessment of patients Current state: Patient is an 80 year old African American male Admitted to the hospital on 05/13/2013 unresponsive. Patient has a past medical history of metastatic melanoma. He required intubation for airway protection. He was seen and evaluated by neurology and felt to be having seizures. His seizure activity was able to be controlled and he was extubated with the plan to not reintubate on 5/14. The patient did better than was expected and is now awake and reasonably alert and able to eat his meals. I spoke via phone with his brother who understands his prognosis and would desire comfort care he is just not sure if he wants his brother to be enrolled in hospice care. We talked about hospice facility placement which may be the best choice for him in light of his significant symptomology but his brother is slightly resistant to enrolling him in a residential hospice. We therefore ended our conversation with the understanding that social work to call him in the morning regarding placement and possible hospice supplementation. For now the goal is to continue to support comfort only without escalation of care.   Goals of Care: 1.  Code Status: DO NOT RESUSCITATE DO NOT INTUBATE   2. Scope of Treatment:Scope of treatment at this time would support dignity and comfort by treating seizure activity and allowing comfort feeding. Continuation of antibiotics except are not expected at this time  4. Disposition: To be determined   3. Symptom Management:   1. Pain: Roxanol 5 mg every 2 hours as needed will be ordered  2. Bowel Regimen: Monitor, last bowel movement 517  3. Comfort feeding 4. Seizure activity continue Keppra and  steroids 5. Consider PT eval since brother would like to consider SNF placement  4. Psychosocial: Patient has no other family other than his brother.  5. Spiritual:Spiritual care has been offered        Patient Documents Completed or Given: Document Given Completed  Advanced Directives Pkt    MOST    DNR    Gone from My Sight    Hard Choices      Brief HPI: 80 year old African American male with known metastatic melanoma presented with altered mental status found to have seizures patient was extubated to comfort and remains marginally stable at this time we are asked to assist her care   ROS: Patient denies pain nausea vomiting abdominal pain headache    PMH:  Past Medical History  Diagnosis Date  . Glaucoma   . Bradycardia   . Diabetes mellitus   . Hyperlipidemia   . Hypertension   . Shortness of breath     WITH EXERTION  . Stroke     "I had a stroke many yrs ago" -no residual problems  . BPH (benign prostatic hyperplasia)   . Difficulty voiding   . Dysrhythmia     AFIB  - TAKES PRADAXA FOLLOWED BY DR. Gwenlyn Found  . Critical lower limb ischemia   . Melanoma   . Arthritis   . Coronary artery disease      PSH: Past Surgical History  Procedure Laterality Date  . Stented coronary artery  2004  . Transurethral resection of prostate  01/24/2012    Procedure: TRANSURETHRAL RESECTION OF THE PROSTATE WITH GYRUS INSTRUMENTS;  Surgeon: Alexis Frock, MD;  Location: [facility];  Service: Urology;  Laterality: N/A;  . Cardiac catheterization  09/19/2002    moderate LAD and RCA and normal LV function  . Nm myocar perf wall motion  05/24/2011    showed inferior scar w/o ischemia  . Doppler echocardiography  05/24/2011    EF >55%  . Doppler echocardiography  09/28/2009    EF >55% lvh w/ left atrial enlargement,diastolic dysfunctio,elevated lv filling pressure  . Nm myocar perf wall motion  09/09/2009    mild ischemia mid anterior,apical anterior and basal inferolateral  . Nm  myocar perf wall motion  12/04/2002  . Nm myocar perf wall motion  05/21/2002  . Cardiac catheterization  07/03/2002  . Cataract extraction    . Melanoma excision Right 10/21/2012    Procedure: MELANOMA EXCISION;  Surgeon: Zenovia Jarred, MD;  Location: [place];  Service: General;  Laterality: Right;  . Inguinal lymph node biopsy Right 10/21/2012    Procedure: INGUINAL LYMPH NODE BIOPSY;  Surgeon: Zenovia Jarred, MD;  Location: [place];  Service: General;  Laterality: Right;  . Application of a-cell of extremity Right 10/21/2012    Procedure: APPLICATION OF A-CELL OF EXTREMITY;  Surgeon: Theodoro Kos, DO;  Location: [place];  Service: Plastics;  Laterality: Right;  . Incision and drainage of wound Right 12/04/2012    Procedure: IRRIGATION AND DEBRIDEMENT RIGHT FOOT WOUND WITH PLACEMENT OF ACELL/VAC;  Surgeon: Theodoro Kos, DO;  Location: [place];  Service: Plastics;  Laterality: Right;  . Esophagogastroduodenoscopy N/A 05/05/2013    Procedure: ESOPHAGOGASTRODUODENOSCOPY (EGD);  Surgeon: Jerene Bears, MD;  Location: [place];  Service: Gastroenterology;  Laterality: N/A;   I have reviewed the [place] and SH and  If appropriate update it with new information. No Known Allergies Scheduled Meds: . dexamethasone  4 mg Intravenous 4 times per day  . levETIRAcetam  1,000 mg Intravenous Q12H   Continuous Infusions: . morphine Stopped (05/16/13 1600)   PRN Meds:.morphine    BP 135/89  Pulse 96  Temp(Src) 97.5 F (36.4 C) (Oral)  Resp 24  Ht 5\' 9"  (1.753 m)  Wt 83.1 kg (183 lb 3.2 oz)  BMI 27.04 kg/m2  SpO2 97%   PPS: 30%   Intake/Output Summary (Last 24 hours) at 05/18/13 1335 Last data filed at 05/18/13 1112  Gross per 24 hour  Intake    240 ml  Output    702 ml  Net   -462 ml   LBM: 05/18/2013                     Physical Exam:  General: [name] will open his eyes he is in no acute distress  HEENT:  Pupils are equal round and reactive to light,  mucous membranes are dry  Chest:   Decreased but clear to auscultation his rhonchorous wheezes  CVS: Regular rate rhythm positive S1 and S2 no S3-S4 appreciate a murmur per gallop  Abdomen:Soft nontender nondistended with positive bowel sounds no hepatosplenomegaly  Ext: Warm trace edema poor nail care  Neuro:Awake alert oriented to being in the hospital   Labs: CBC    Component Value Date/Time   WBC 7.6 05/15/2013 0137   RBC 3.09* 05/15/2013 0137   RBC 2.34* 05/02/2013 2300   HGB 8.6* 05/15/2013 0137   HCT 27.2* 05/15/2013 0137   PLT 181 05/15/2013 0137  MCV 88.0 05/15/2013 0137   MCH 27.8 05/15/2013 0137   MCHC 31.6 05/15/2013 0137   RDW 18.5* 05/15/2013 0137   LYMPHSABS 0.6* 05/15/2013 0137   MONOABS 0.2 05/15/2013 0137   EOSABS 0.0 05/15/2013 0137   BASOSABS 0.0 05/15/2013 0137      CMP     Component Value Date/Time   NA 141 05/15/2013 0137   K 3.6* 05/15/2013 0137   CL 106 05/15/2013 0137   CO2 21 05/15/2013 0137   GLUCOSE 186* 05/15/2013 0137   BUN 21 05/15/2013 0137   CREATININE 1.39* 05/15/2013 0137   CALCIUM 8.3* 05/15/2013 0137   PROT 7.3 05/15/2013 0137   ALBUMIN 1.9* 05/15/2013 0137   AST 14 05/15/2013 0137   ALT 11 05/15/2013 0137   ALKPHOS 44 05/15/2013 0137   BILITOT 0.8 05/15/2013 0137   GFRNONAA 45* 05/15/2013 0137   GFRAA 52* 05/15/2013 0137    Chest Xray Reviewed/Impressions: Atelectasis, right lower lobe  CT scan of the Head Reviewed/Impressions:  1. Multifocal intracranial hemorrhage with largest areas of bleeding  in the right frontal and left temporal lobe and blood in the lateral  ventricles.  2. Scattered areas of nonhemorrhagic areas of low attenuation  suggest vasogenic edema potentially associated with metastatic  disease. Patient has a history of melanoma and metastatic melanoma  could produce multiple hemorrhagic metastases. Other differential  considerations include hemorrhagic infarction, hypertensive  hemorrhage and amyloid angiopathy.  3. Followup MRI  with the without contrast is recommended to further  assess the cause of hemorrhage.     Time In Time Out Total Time Spent with Patient Total Overall Time  100 130 pm  15  minutes 30 minutes includes time brother with brother on phone.    Greater than 50%  of this time was spent counseling and coordinating care related to the above assessment and plan.   Zachary Lovins L. Lovena Le, MD MBA The Palliative Medicine Team at [facility] Phone: [phone] Pager: [phone]

## 2013-05-18 NOTE — Progress Notes (Signed)
Patient ID: Craig Martin, male   DOB: [birth year], 80 y.o.   MRN: [id number]  [facility] PROGRESS NOTE  Craig Martin [id number]:[id number] DOB: [birth year] DOA: 05/13/2013 PCP: Craig Greenland, MD  Brief narrative: 80 year old male with metastatic melanoma met brain, ICH.   SIGNIFICANT EVENTS / STUDIES:  5/12 intubated in ED.  Jun 05, 2022- DNR established  5/14- poor weaning effort  5/14- dnr established, extubation planned with DNI, comfort if fails  5/15- weaning improved, extubated , comfort care   LINES / TUBES:  ETT 5/12>>>5/15  PIV  CULTURES:  None  ANTIBIOTICS:  None   Assessment and plan: VDRF - extubated 5/15  - clinically stable this AM and maintaining oxygen saturation at target range  A-fib on pradaxa, now ICH  - Hold anti-coag permenently  - DNR  ARF - improving over the course of hospital stay  - no further blood work to ensure comfort  Hypokalemia - supplemented but no further blood work to ensure comfort as noted above  Severe malnutrition - secondary to progressive nature of the metastatic illness  - comfort feeding as pt desires  Likely metastatic melanoma - comfort care  - appreciate PCT input and assistance  - continue keppra and decadron  Multiple sites of ICH. Likely metastatic disease.   Code Status: DNR Family Communication: Pt at bedside Disposition Plan: To be determined   HPI/Subjective: No events overnight.   Objective: Filed Vitals:   05/17/13 0800 05/17/13 1215 05/17/13 1509 05/18/13 0532  BP: 129/83 130/69 137/83 135/89  Pulse: 83  96 96  Temp:   97.9 F (36.6 C) 97.5 F (36.4 C)  TempSrc:   Oral Oral  Resp: 23 22 20 24   Height:      Weight:      SpO2: 99% 96% 98% 97%    Intake/Output Summary (Last 24 hours) at 05/18/13 1430 Last data filed at 05/18/13 1112  Gross per 24 hour  Intake    240 ml  Output    702 ml  Net   -462 ml    Exam:   General:  Pt is alert, follows commands appropriately, not in acute  distress  Cardiovascular: irregular rate and rhythm, no rubs, no gallops  Respiratory: Clear to auscultation bilaterally, diminished breath sounds at bases   Data Reviewed: Basic Metabolic Panel:  Recent Labs Lab 05/13/13 1715 Jun 04, 2013 0215 05/15/13 0137  NA 139 137 141  K 3.3* 3.8 3.6*  CL 105 106 106  CO2 23 19 21   GLUCOSE 177* 101* 186*  BUN 23 22 21   CREATININE 1.60* 1.44* 1.39*  CALCIUM 7.8* 7.9* 8.3*  MG  --  1.5  --   PHOS  --  3.1  --    Liver Function Tests:  Recent Labs Lab 05/13/13 1715 05/15/13 0137  AST 16 14  ALT 13 11  ALKPHOS 47 44  BILITOT 0.8 0.8  PROT 8.0 7.3  ALBUMIN 2.3* 1.9*   CBC:  Recent Labs Lab 05/13/13 1715 06/04/2013 0215 05/15/13 0137  WBC 7.7 6.4 7.6  NEUTROABS 3.6  --  6.8  HGB 8.9* 8.8* 8.6*  HCT 28.6* 28.0* 27.2*  MCV 89.4 88.9 88.0  PLT 205 176 181   Cardiac Enzymes:  Recent Labs Lab 05/13/13 1715  TROPONINI <0.30   CBG:  Recent Labs Lab 05/16/13 0321 05/16/13 0752 05/16/13 1157 05/16/13 1541 05/16/13 2344  GLUCAP 245* 227* 255* 207* 214*    Recent Results (from the past 240 hour(s))  MRSA PCR SCREENING  Status: None   Collection Time    05/13/13  8:59 PM      Result Value Ref Range Status   MRSA by PCR NEGATIVE  NEGATIVE Final   Comment:            The GeneXpert MRSA Assay (FDA     approved for NASAL specimens     only), is one component of a     comprehensive MRSA colonization     surveillance program. It is not     intended to diagnose MRSA     infection nor to guide or     monitor treatment for     MRSA infections.     Scheduled Meds: . dexamethasone  4 mg Intravenous 4 times per day  . levETIRAcetam  1,000 mg Intravenous Q12H   Continuous Infusions: . morphine Stopped (05/16/13 1600)    Craig Blaze, MD  [facility] Pager [phone]  If 7PM-7AM, please contact night-coverage [email or web address] Password [email or web address] 05/18/2013, 2:30 PM   LOS: 5 days

## 2013-05-19 MED ORDER — DEXAMETHASONE SODIUM PHOSPHATE 4 MG/ML IJ SOLN
4.0000 mg | Freq: Four times a day (QID) | INTRAMUSCULAR | Status: DC
  Administered 2013-05-19 – 2013-05-21 (×7): 4 mg via INTRAVENOUS
  Filled 2013-05-19 (×11): qty 1

## 2013-05-19 MED ORDER — MORPHINE SULFATE (CONCENTRATE) 10 MG /0.5 ML PO SOLN
5.0000 mg | ORAL | Status: DC | PRN
  Administered 2013-05-19 – 2013-05-21 (×6): 5 mg via ORAL
  Filled 2013-05-19 (×6): qty 0.5

## 2013-05-19 NOTE — Progress Notes (Signed)
Subjective: No further seizures at this time.  Exam: Filed Vitals:   05/19/13 1301  BP: 137/77  Pulse: 93  Temp: 97.9 F (36.6 C)  Resp: 20   Gen: In bed, NAD MS: somnolent.  KC:LEXNT, eomi Motor: MAEW Sensory:intact to LT  Impression: 80 yo M with seizure in teh setting of intracranial metastasis. I agree with discussions regarding goals of care, and palliative care is following along. I would favor continuing decadron and keppra pending his involvement in these discussion. Oncology state that he is not a chemo candidate, radiation may be an option. I think that in his current state, this is going to be difficult, but discussions with family continue.   Recommendations: 1) Agree with discussions regarding goals of care.  2) continue Keppra at current dose 3) with increased sleepiness, I am concerned that he does still need a higher dose of steroids, will increase back to 4mg  Q6H  Roland Rack, MD [facility] [phone]  If 7pm- 7am, please page neurology on call as listed in [place].

## 2013-05-19 NOTE — Progress Notes (Signed)
Nutrition Brief Note  Chart reviewed.  Pt has been extubated without further seizures. Pt now transitioning to comfort care with disposition planned to residential hospice. No further nutrition interventions warranted at this time.  Please re-consult as needed.   Brynda Greathouse, MS RD LDN Clinical Inpatient Dietitian Pager: [phone] Weekend/After hours pager: [phone]

## 2013-05-19 NOTE — Progress Notes (Signed)
Patient ID: Craig Martin, male   DOB: 11-Oct-1926, 80 y.o.   MRN: [id number]  [facility] PROGRESS NOTE  Craig Martin [id number]:[id number] DOB: 1926-07-11 DOA: 05/13/2013 PCP: [name], MD  Brief narrative:  80 year old male with metastatic melanoma met brain, ICH.   SIGNIFICANT EVENTS / STUDIES:  5/12 intubated in ED.  13-Jun-2022- DNR established  5/14- poor weaning effort  5/14- dnr established, extubation planned with DNI, comfort if fails  5/15- weaning improved, extubated , comfort care  LINES / TUBES:  ETT 5/12>>>5/15  PIV  CULTURES:  None  ANTIBIOTICS:  None   Assessment and plan:  VDRF  - extubated 5/15  - clinically stable this AM and maintaining oxygen saturation at target range  A-fib on pradaxa, now ICH  - Hold anti-coag permenently  - DNR  ARF  - improving over the course of hospital stay  - no further blood work to ensure comfort  Hypokalemia  - supplemented but no further blood work to ensure comfort as noted above  Severe malnutrition  - secondary to progressive nature of the metastatic illness  - comfort feeding as pt desires  Likely metastatic melanoma to the brain  - comfort care  - appreciate PCT input and assistance  - continue keppra and decadron  Multiple sites of ICH. Likely metastatic disease.  - with brain mets - discussed with radiation oncologist, pt will either need transfer to [facility] if he desires to proceed with radiation therapy or it can be arranged in an outpatient setting - pt not very alert this AM to answer this question, will follow upon family opinion  - continue decadron  Code Status: DNR  Family Communication: No family at bedside  Disposition Plan: To be determined    HPI/Subjective: No events overnight.   Objective: Filed Vitals:   05/18/13 1340 05/18/13 2237 05/19/13 0521 05/19/13 1301  BP: 132/78 124/79 130/80 137/77  Pulse: 86 74 71 93  Temp: 97.8 F (36.6 C) 97.8 F (36.6 C) 97.5 F (36.4 C) 97.9 F (36.6  C)  TempSrc: Oral Axillary Axillary Axillary  Resp: 22 22 22 20   Height:      Weight:      SpO2: 98% 98% 99% 99%    Intake/Output Summary (Last 24 hours) at 05/19/13 1358 Last data filed at 05/19/13 1144  Gross per 24 hour  Intake    360 ml  Output    700 ml  Net   -340 ml    Exam:   General:  Pt is somnolent but easy to arouse, following some commands if awake   Cardiovascular: irregular rate and rhythm,  no rubs, no gallops  Respiratory: Clear to auscultation bilaterally, no wheezing, diminished breath sounds at bases   Abdomen: Soft, non tender, non distended, bowel sounds present, no guarding  Extremities: No edema, pulses DP and PT palpable bilaterally  Data Reviewed: Basic Metabolic Panel:  Recent Labs Lab 05/13/13 1715 2013/06/12 0215 05/15/13 0137  NA 139 137 141  K 3.3* 3.8 3.6*  CL 105 106 106  CO2 23 19 21   GLUCOSE 177* 101* 186*  BUN 23 22 21   CREATININE 1.60* 1.44* 1.39*  CALCIUM 7.8* 7.9* 8.3*  MG  --  1.5  --   PHOS  --  3.1  --    Liver Function Tests:  Recent Labs Lab 05/13/13 1715 05/15/13 0137  AST 16 14  ALT 13 11  ALKPHOS 47 44  BILITOT 0.8 0.8  PROT 8.0 7.3  ALBUMIN 2.3* 1.9*   CBC:  Recent Labs Lab 05/13/13 1715 05/14/13 0215 05/15/13 0137  WBC 7.7 6.4 7.6  NEUTROABS 3.6  --  6.8  HGB 8.9* 8.8* 8.6*  HCT 28.6* 28.0* 27.2*  MCV 89.4 88.9 88.0  PLT 205 176 181   Cardiac Enzymes:  Recent Labs Lab 05/13/13 1715  TROPONINI <0.30   CBG:  Recent Labs Lab 05/16/13 0321 05/16/13 0752 05/16/13 1157 05/16/13 1541 05/16/13 2344  GLUCAP 245* 227* 255* 207* 214*    Recent Results (from the past 240 hour(s))  MRSA PCR SCREENING     Status: None   Collection Time    05/13/13  8:59 PM      Result Value Ref Range Status   MRSA by PCR NEGATIVE  NEGATIVE Final   Comment:            The GeneXpert MRSA Assay (FDA     approved for NASAL specimens     only), is one component of a     comprehensive MRSA colonization      surveillance program. It is not     intended to diagnose MRSA     infection nor to guide or     monitor treatment for     MRSA infections.     Scheduled Meds: . dexamethasone  4 mg Intravenous Q12H  . levETIRAcetam  1,000 mg Oral BID   Continuous Infusions:   [name], MD  [facility] Pager [phone]  If 7PM-7AM, please contact night-coverage [email or web address] Password [email or web address] 05/19/2013, 1:58 PM   LOS: 6 days

## 2013-05-19 NOTE — Progress Notes (Signed)
Patient Craig Martin      DOB: 29-Apr-1926      VXY:[id number]   Palliative Medicine Team at [facility] Progress Note    Subjective: Patient awake and alert.  Slight cough today.  Updated Education officer, museum on patient's brothers conversation with me. Patient reports no pain or dyspnea. Childhood friend stopped by to encourage him.  Filed Vitals:   05/19/13 0521  BP: 130/80  Pulse: 71  Temp: 97.5 F (36.4 C)  Resp: 22   Physical exam:  Pupils equal, mmm Chest decreased, some basilar crackles CVS: regular, S1, S2 Abd: soft, not tender Ext: dressing over right foot Neuro: oriented to person general place but not time    Assessment and plan: 80 yr old with metastatic melanoma. Admitted with altered mental status secondary to seizures related to brain lesions.  Patient survived extubation but remains significantly debilitated.  Family to decide on best venue for care offered [place].   1.  DNR  2.  Seizures: continue oral keppra,  Can change to oral steroids  Prognosis:   Days to weeks   Total time: 20 min 1100-1120  Jacobe Study L. Lovena Le, MD MBA The Palliative Medicine Team at [facility] Phone: [phone] Pager: [phone]

## 2013-05-19 NOTE — Clinical Social Work Note (Signed)
CSW received referral regarding discharge disposition. Per chart review and consulting with medical team, pt's brother Craig Martin decision maker] is unsure of pt's discharge disposition between residential hospice and SNF. CSW spoke with pt's brother, Craig Martin [[phone]]. Per Craig Martin, family has decided residential hospice would be "best option" for pt. Craig Martin informed CSW of family preference for [facility] residential hospice, however, Craig Martin stated he still had concerns regarding pt's care while at [facility] residential hospice. CSW offered support. CSW has made referral to [facility] residential hospice and requested [place] admissions liaison please speak with pt's brother regarding his concerns.  CSW to continue to follow and assist with discharge planning needs as disposition determined and pt medically stable for discharge.  Craig Martin, [place] Social Worker [phone]

## 2013-05-19 NOTE — Progress Notes (Signed)
Spoke with patient along with some "close family friends".  Trying to figure out about pt's children who called earlier today.  Everyone clarified that pt's brother Ludwig Clarks is the FIRST to be called.  The other children Levonne Spiller ([phone]) and Arty Baumgartner [phone]) are a little more "estranged" per everyone visiting.  But here is their information if needed.  But again, Ludwig Clarks should be called first!

## 2013-05-20 MED ORDER — DEXAMETHASONE 4 MG PO TABS: 4.0000 mg | ORAL_TABLET | Freq: Four times a day (QID) | ORAL | Status: AC

## 2013-05-20 MED ORDER — MORPHINE SULFATE (CONCENTRATE) 10 MG /0.5 ML PO SOLN: 5.0000 mg | ORAL | Status: AC | PRN

## 2013-05-20 MED ORDER — LEVETIRACETAM 1000 MG PO TABS
1000.0000 mg | ORAL_TABLET | Freq: Two times a day (BID) | ORAL | Status: AC
Start: 2013-05-20 — End: ?

## 2013-05-20 NOTE — Discharge Instructions (Addendum)
Seizure, Adult A seizure is abnormal electrical activity in the brain. Seizures usually last from 30 seconds to 2 minutes. There are various types of seizures. Before a seizure, you may have a warning sensation (aura) that a seizure is about to occur. An aura may include the following symptoms:   Fear or anxiety.  Nausea.  Feeling like the room is spinning (vertigo).  Vision changes, such as seeing flashing lights or spots. Common symptoms during a seizure include:  A change in attention or behavior (altered mental status).  Convulsions with rhythmic jerking movements.  Drooling.  Rapid eye movements.  Grunting.  Loss of bladder and bowel control.  Bitter taste in the mouth.  Tongue biting. After a seizure, you may feel confused and sleepy. You may also have an injury resulting from convulsions during the seizure. HOME CARE INSTRUCTIONS   If you are given medicines, take them exactly as prescribed by your health care provider.  Keep all follow-up appointments as directed by your health care provider.  Do not swim or drive or engage in risky activity during which a seizure could cause further injury to you or others until your health care provider says it is OK.  Get adequate rest.  Teach friends and family what to do if you have a seizure. They should:  Lay you on the ground to prevent a fall.  Put a cushion under your head.  Loosen any tight clothing around your neck.  Turn you on your side. If vomiting occurs, this helps keep your airway clear.  Stay with you until you recover.  Know whether or not you need emergency care. SEEK IMMEDIATE MEDICAL CARE IF:  The seizure lasts longer than 5 minutes.  The seizure is severe or you do not wake up immediately after the seizure.  You have an altered mental status after the seizure.  You are having more frequent or worsening seizures. Someone should drive you to the emergency department or call local emergency  services (911 in [place] MAKE SURE YOU:  Understand these instructions.  Will watch your condition.  Will get help right away if you are not doing well or get worse. Document Released: 12/17/1999 Document Revised: 10/09/2012 Document Reviewed: 07/31/2012 ExitCare Patient Information 2014 [employer].  

## 2013-05-20 NOTE — Discharge Summary (Signed)
Physician Discharge Summary  Craig Martin VZS:[id number] DOB: [birth year] DOA: 05/13/2013  PCP: [name], MD  Admit date: 05/13/2013 Discharge date: 05/21/2013  Recommendations for Outpatient Follow-up:  1. Pt will be discharged to [facility] place in AM  Discharge Diagnoses: ICH Active Problems:   Acute respiratory failure   Altered mental status   ICH (intracerebral hemorrhage)  Discharge Condition: Stable  Diet recommendation: Comfort feeding as pt able to tolerate   Brief narrative:   80 year old male with metastatic melanoma met brain, ICH.   SIGNIFICANT EVENTS / STUDIES:  5/12 intubated in ED.  05-19-2022- DNR established  5/14- poor weaning effort  5/14- dnr established, extubation planned with DNI, comfort if fails  5/15- weaning improved, extubated , comfort care  LINES / TUBES:  ETT 5/12>>>5/15  PIV  CULTURES:  None  ANTIBIOTICS:  None   Assessment and plan:  VDRF  - extubated 5/15  - somnolent this AM but NAD A-fib on pradaxa, now ICH  - Hold anti-coag permenently  - DNR  ARF  - improving over the course of hospital stay  - no further blood work to ensure comfort  Hypokalemia  - supplemented but no further blood work to ensure comfort as noted above  Severe malnutrition  - secondary to progressive nature of the metastatic illness  - comfort feeding as pt desires  Likely metastatic melanoma to the brain  - comfort care  - appreciate PCT input and assistance  - continue keppra and decadron  - per radiation oncologist, radiation to the brain would not offer significant benefit and continuing decadron is reasonable - plan for d/c to [facility] place in AM Multiple sites of [place]. Likely metastatic disease.  - with brain mets  - discussed with radiation oncologist, again, determined that pt would not benefit from radiation to the brain and that dexamethasone is offering continuation of comfort care  - pt not very alert this AM - continue decadron   Code  Status: DNR  Family Communication: No family at bedside   Discharge Exam: Filed Vitals:   05/20/13 0615  BP: 116/92  Pulse: 101  Temp: 97.9 F (36.6 C)  Resp: 22   Filed Vitals:   05/19/13 1301 05/19/13 1820 05/19/13 2208 05/20/13 0615  BP: 137/77 117/87 128/78 116/92  Pulse: 93 103 136 101  Temp: 97.9 F (36.6 C) 98 F (36.7 C) 98.2 F (36.8 C) 97.9 F (36.6 C)  TempSrc: Axillary Axillary Axillary Axillary  Resp: 20 18 20 22   Height:      Weight:      SpO2: 99%  100% 100%    General: Pt is lethargic, not answering any questions, not following commands  Cardiovascular: Regular rate and rhythm, no rubs, no gallops Respiratory: Clear to auscultation bilaterally, no wheezing, diminished breath sounds at bases Abdominal: Soft, non tender, non distended, bowel sounds +, no guarding  Discharge Instructions     Medication List    STOP taking these medications       acetaminophen 500 MG tablet  Commonly known as:  TYLENOL     aspirin EC 81 MG tablet     carvedilol 3.125 MG tablet  Commonly known as:  COREG     dabigatran 75 MG Caps capsule  Commonly known as:  PRADAXA     DSS 100 MG Caps     furosemide 20 MG tablet  Commonly known as:  LASIX     linagliptin 5 MG Tabs tablet  Commonly known as:  TRADJENTA     losartan 25 MG tablet  Commonly known as:  COZAAR     LUMIGAN 0.01 % Soln  Generic drug:  bimatoprost     pantoprazole 40 MG tablet  Commonly known as:  PROTONIX      TAKE these medications       dexamethasone 4 MG tablet  Commonly known as:  DECADRON  Take 1 tablet (4 mg total) by mouth every 6 (six) hours.     levETIRAcetam 1000 MG tablet  Commonly known as:  KEPPRA  Take 1 tablet (1,000 mg total) by mouth 2 (two) times daily.     morphine CONCENTRATE 10 mg / 0.5 ml concentrated solution  Take 0.25 mLs (5 mg total) by mouth every 2 (two) hours as needed for severe pain or shortness of breath.           Follow-up Information    Follow up with [name], MD. (As needed, If symptoms worsen)    Specialty:  Internal Medicine   Contact information:   [address] [place] [address] [phone]        The results of significant diagnostics from this hospitalization (including imaging, microbiology, ancillary and laboratory) are listed below for reference.     Microbiology: Recent Results (from the past 240 hour(s))  MRSA PCR SCREENING     Status: None   Collection Time    05/13/13  8:59 PM      Result Value Ref Range Status   MRSA by PCR NEGATIVE  NEGATIVE Final   Comment:            The GeneXpert MRSA Assay (FDA     approved for NASAL specimens     only), is one component of a     comprehensive MRSA colonization     surveillance program. It is not     intended to diagnose MRSA     infection nor to guide or     monitor treatment for     MRSA infections.     Labs: Basic Metabolic Panel:  Recent Labs Lab 05/13/13 1715 05/14/13 0215 05/15/13 0137  NA 139 137 141  K 3.3* 3.8 3.6*  CL 105 106 106  CO2 23 19 21   GLUCOSE 177* 101* 186*  BUN 23 22 21   CREATININE 1.60* 1.44* 1.39*  CALCIUM 7.8* 7.9* 8.3*  MG  --  1.5  --   PHOS  --  3.1  --    Liver Function Tests:  Recent Labs Lab 05/13/13 1715 05/15/13 0137  AST 16 14  ALT 13 11  ALKPHOS 47 44  BILITOT 0.8 0.8  PROT 8.0 7.3  ALBUMIN 2.3* 1.9*   CBC:  Recent Labs Lab 05/13/13 1715 05/14/13 0215 05/15/13 0137  WBC 7.7 6.4 7.6  NEUTROABS 3.6  --  6.8  HGB 8.9* 8.8* 8.6*  HCT 28.6* 28.0* 27.2*  MCV 89.4 88.9 88.0  PLT 205 176 181   Cardiac Enzymes:  Recent Labs Lab 05/13/13 1715  TROPONINI <0.30   BNP: BNP (last 3 results)  Recent Labs  01/11/13 0145 05/02/13 1746  PROBNP 6537.0* 5942.0*   CBG:  Recent Labs Lab 05/16/13 0321 05/16/13 0752 05/16/13 1157 05/16/13 1541 05/16/13 2344  GLUCAP 245* 227* 255* 207* 214*   SIGNED: Time coordinating discharge: Over 30 minutes  [name], MD  [facility] 05/20/2013, 1:33 PM Pager [phone]  If 7PM-7AM, please contact night-coverage [email or web address] Password [email or web address]

## 2013-05-20 NOTE — Consult Note (Signed)
[facility] Liaison: (late entry from 05/19/2013 1730)  Received request from [place] to contact patient's brother Ludwig Clarks to answer questions specific to [facility], which I did. Appears Mr. Lemmons has had negative experiences with SNFs, [name] needed to understand staffing and what daily care included. [name] agreeable for chart information to be reviewed for [facility] consideration. Chart reviewed and received report from [employer]. Appreciate clarification re family members. Spoke with [name] again at 1730. He is agreeable to [facility] transfer Wednesday. Ludwig Clarks is wheelchair bound, dependent on [facility] for transportation most days. He already has wheelchair transportation arranged for today (Tuesday) to help him pay bills and make arrangements for Mr. Quentin Cornwall. [name] plans to meet me at 4:30 pm today to complete registration paperwork for transfer to [facility] Wednesday. Made [name] aware this information to be shared with CSW this am which I will do. [name] prefers to be the one to share with patient plan for [facility]. Hope to engage with Mr. Greek after brother speaks with him. Thank you. Erling Conte LCSW [phone]

## 2013-05-21 MED ORDER — LORAZEPAM 0.5 MG PO TABS: 0.5000 mg | ORAL_TABLET | Freq: Four times a day (QID) | ORAL | Status: DC | PRN

## 2013-05-21 MED ORDER — MORPHINE SULFATE 2 MG/ML IJ SOLN
2.0000 mg | INTRAMUSCULAR | Status: DC | PRN
  Administered 2013-05-21: 2 mg via INTRAVENOUS
  Filled 2013-05-21: qty 1

## 2013-05-21 MED ORDER — LORAZEPAM 2 MG/ML IJ SOLN
0.5000 mg | INTRAMUSCULAR | Status: DC | PRN
  Administered 2013-05-21: 0.5 mg via INTRAVENOUS
  Filled 2013-05-21: qty 1

## 2013-05-21 NOTE — Clinical Social Work Note (Signed)
CSW has completed discharge packet and placed on pt's shadow chart. CSW spoke with pt's brother, Craig Martin, confirming discharge disposition. Craig Martin requested RN to please call him at home to update Craig Martin on pt's medical condition and ambulance arrival. CSW updated RN on information above. CSW has arranged for ambulance transportation via EMS ([facility]).  RN, please see [place] admission liaison note Craig Martin) for report number.  Craig Martin, Craig Martin Social Worker [phone]

## 2013-05-21 NOTE — Progress Notes (Signed)
Call made to on call MD.  I have informed her that pt. Continues to be very agitated and short of breath.  I have informed her that pt. Is pocketing po medications.  She has given me new orders.  I have given pt. IV Ativan 0.5mg  and Morphine 2mg  to assist with agitation and discomfort.  Nursing will continue to follow progress of pt.

## 2013-05-21 NOTE — Progress Notes (Signed)
                                                                               Agree with current discharge and plan dictated by Dr. Mart Piggs on 05/20/2013.   Please see her full note.   Patient will be discharged to [facility] place. Patient currently stable for discharge.  Tammatha Cobb D.O. on 05/21/2013 at 8:19 AM  Between 7am to 7pm - Pager - [phone]  After 7pm go to [email or web address] - password [email or web address]  And look for the night coverage person covering for me after hours  [facility] Office  [phone]

## 2013-05-21 NOTE — Progress Notes (Signed)
Report called and given to nurse Nira Conn at [facility], also called patient's brother  Craig Martin to let him know how pt is doing and that he is being transported to [facility].

## 2013-05-21 NOTE — Consult Note (Signed)
[facility] Liaison: Met with patient's brother Ludwig Clarks at bedside to complete registration paper work yesterday afternoon for transfer to [facility] today. CSW aware. Dr. Orpah Melter to assume care. Discharge summary has already been faxed. RN please call report to [phone]. Please arrange for Mr. Dada to arrive at [facility] before noon. Thank you. Erling Conte LCSW [phone]

## 2013-06-02 ENCOUNTER — Encounter (HOSPITAL_BASED_OUTPATIENT_CLINIC_OR_DEPARTMENT_OTHER): Payer: Medicare Other | Attending: Plastic Surgery

## 2013-06-02 DEATH — deceased

## 2013-06-05 ENCOUNTER — Telehealth: Payer: Self-pay | Admitting: Internal Medicine

## 2013-06-05 NOTE — Telephone Encounter (Signed)
CALLED PATIENT TO SCHEDULE NP NO ANSWER WILL CALL BACK.

## 2013-06-06 ENCOUNTER — Telehealth: Payer: Self-pay | Admitting: Internal Medicine

## 2013-06-06 NOTE — Telephone Encounter (Signed)
CALLED AND LEFT MESSAGE FOR PATIENT BROTHER([name]) TO RETURN CALL TO SCHEDULE NP APPT.

## 2014-01-15 ENCOUNTER — Encounter (HOSPITAL_COMMUNITY): Payer: Self-pay | Admitting: Internal Medicine

## 2015-04-08 IMAGING — CT CT HEAD W/O CM
1 series · 14 of 30 positions shown, 18 images · non-contrast
Comparison: None.

CLINICAL DATA: Unresponsive patient with seizure activity.

EXAM:
CT HEAD WITHOUT CONTRAST
TECHNIQUE: Contiguous axial images were obtained from the base of the skull
through the vertex without intravenous contrast.

[Series 2: head 5.0 h30s · axial · 0.47mm/px · z∈[+1107,+1252]mm · 14 of 34 slices shown, 18 images]
[im 3/34  brain]
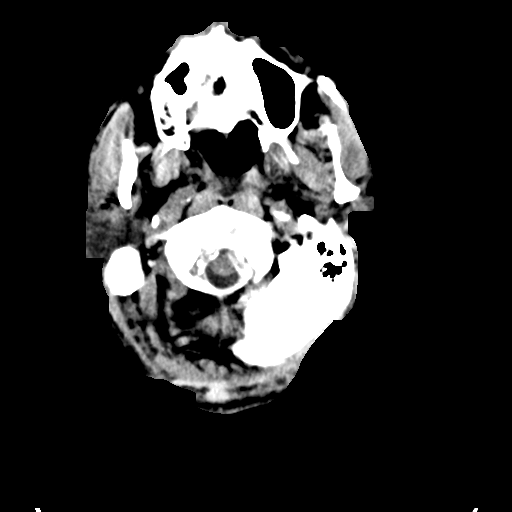
[im 3/34  bone]
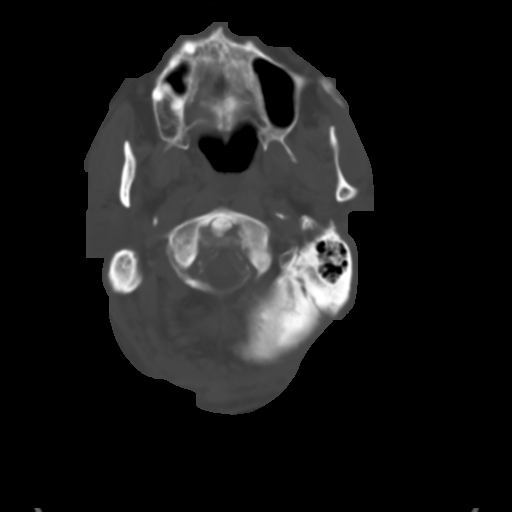
[im 5/34  brain]
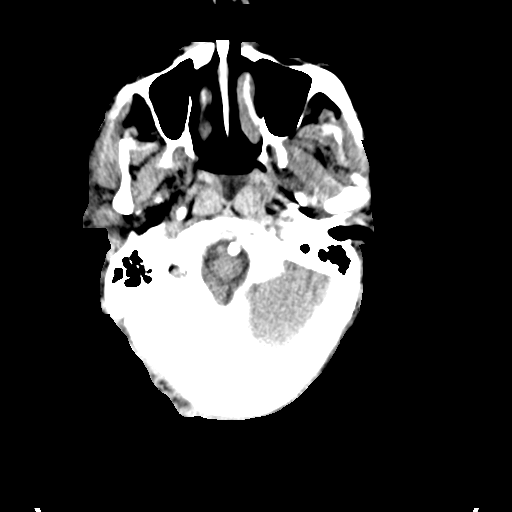
[im 7/34  brain]
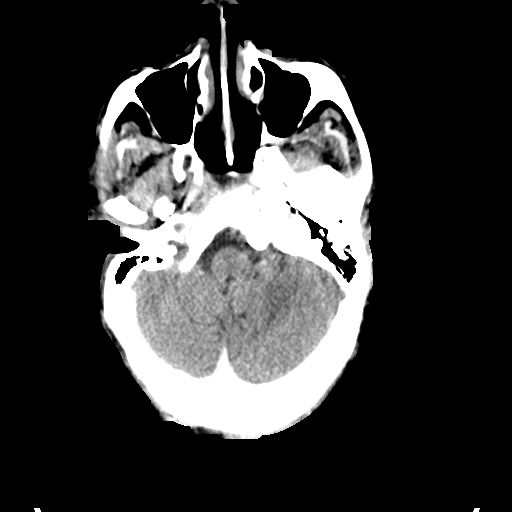
[im 10/34  brain]
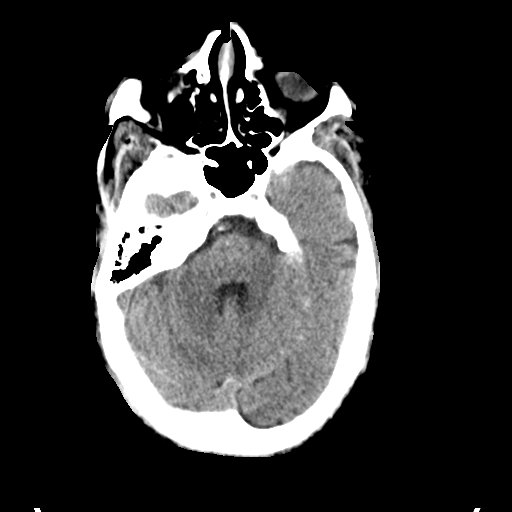
[im 12/34  brain]
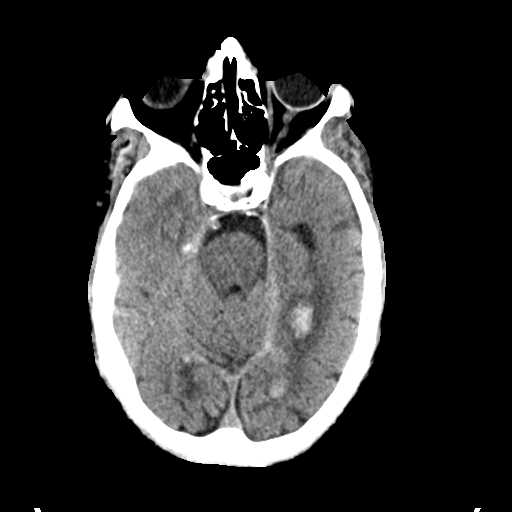
[im 12/34  bone]
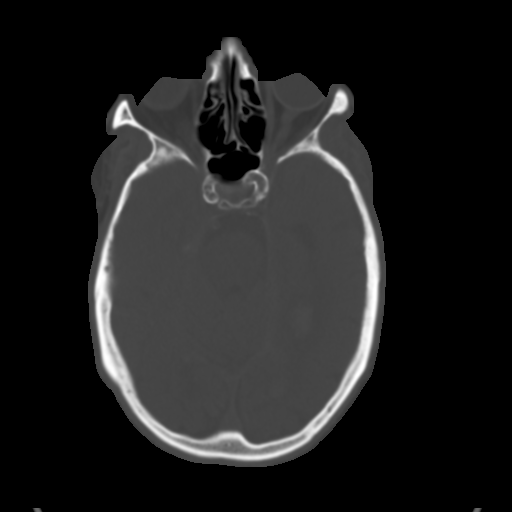
[im 14/34  brain]
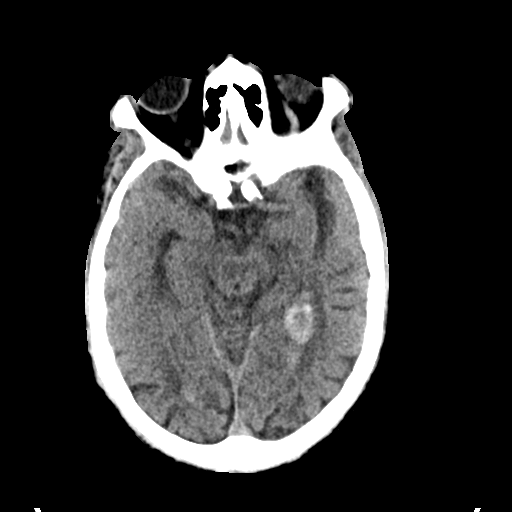
[im 16/34  brain]
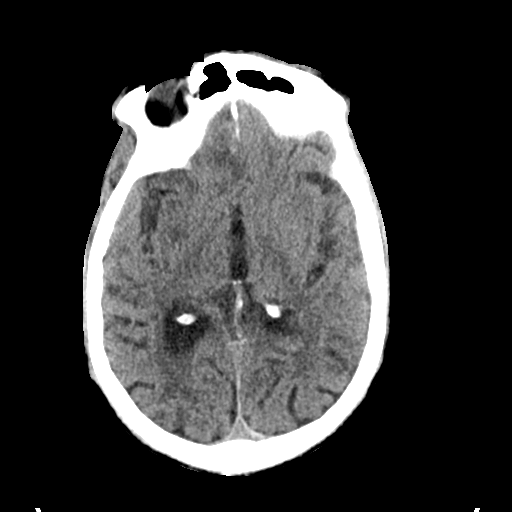
[im 19/34  brain]
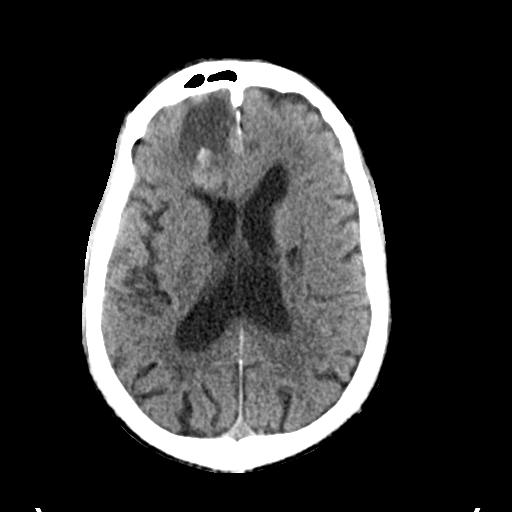
[im 21/34  brain]
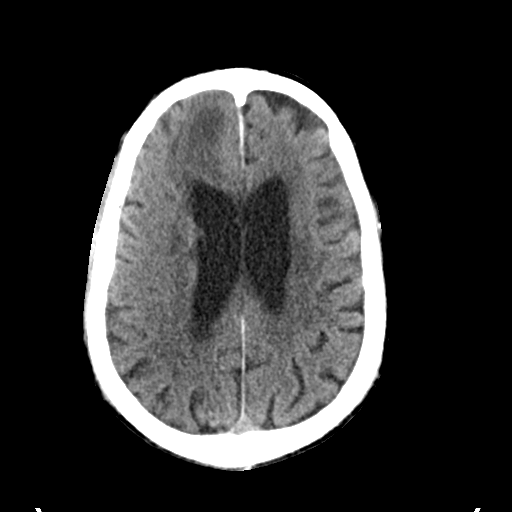
[im 21/34  bone]
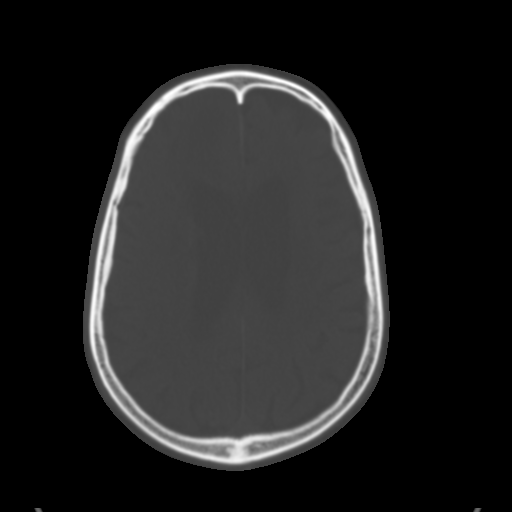
[im 23/34  brain]
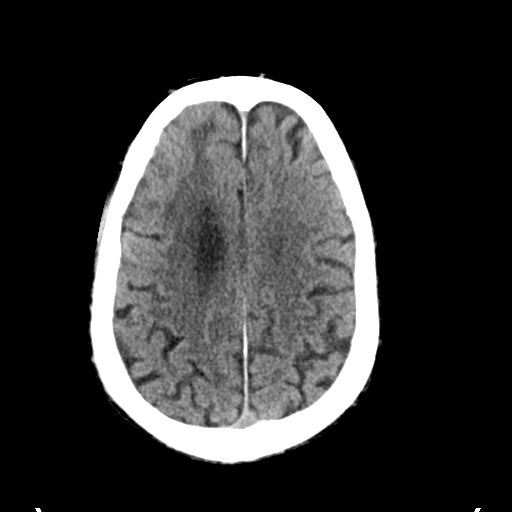
[im 26/34  brain]
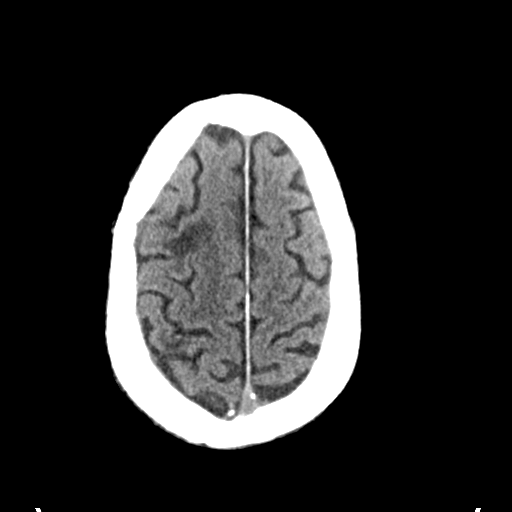
[im 28/34  brain]
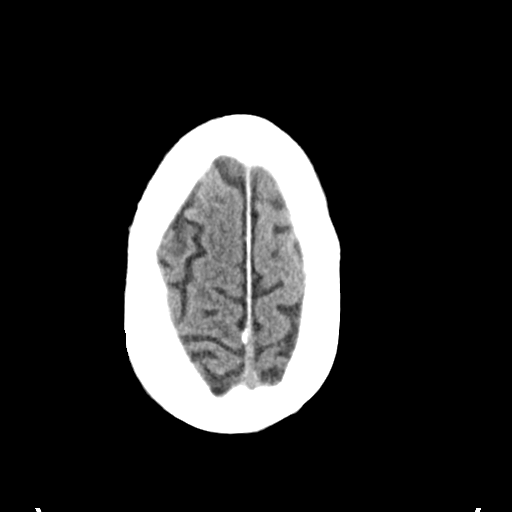
[im 30/34  brain]
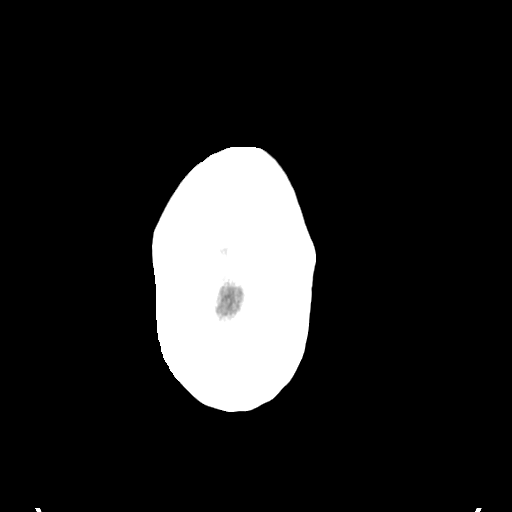
[im 30/34  bone]
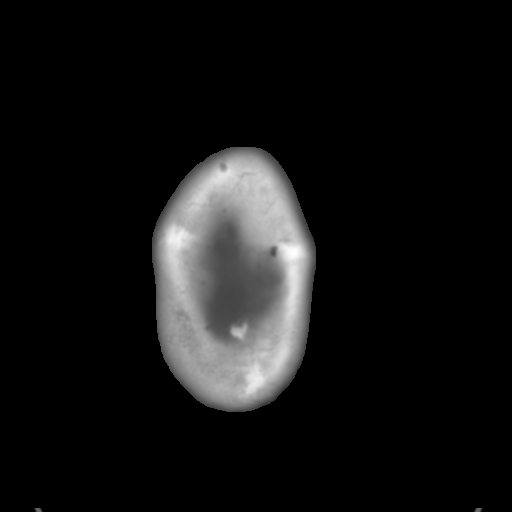
[im 32/34  brain]
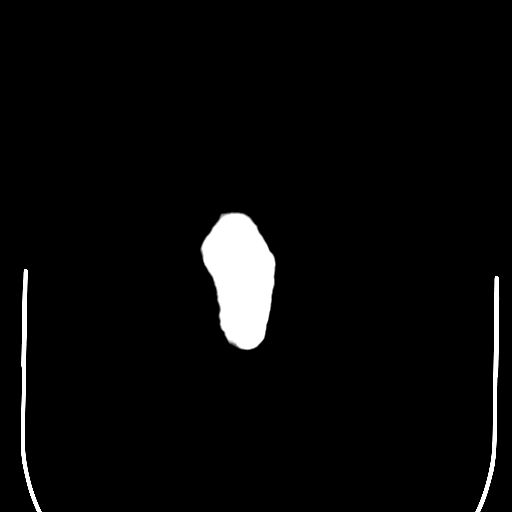

[14 of 30 positions shown; findings below may reference images not displayed]

FINDINGS: Multiple foci of intracranial hemorrhage are present with vasogenic
edema. The largest is in the right frontal lobe, measuring 45 mm x
23 mm, with hematocrit layering dependently. Sulcal effacement is
present in the right frontal lobe associated with edema and
hemorrhage.

There is no midline shift. Another area hemorrhage is present in the
periventricular left temporal lobe, with intraventricular break
through and blood layering within the occipital horn of the left
lateral ventricle. There is thickening of the tentorium suggesting
subdural hemorrhage however this may simply represent tentorial
calcification.

There is a low-attenuation region in the posterior right frontal
lobe without hemorrhage. This could represent old
ischemia/infarction or vasogenic edema associated with metastatic
disease.

Another low-attenuation lesion is present in the inferior right
occipital lobe. There is some hemorrhage layering within the
occipital horn of the right lateral ventricle is well. No definite
posterior fossa lesions.

The suprasellar cistern remains patent. Dense intracranial
atherosclerosis. Endotracheal and orogastric intubation is evident
on the lateral view. Paranasal sinuses appear within normal limits.
Dense intracranial atherosclerosis.
IMPRESSION: 1. Multifocal intracranial hemorrhage with largest areas of bleeding
in the right frontal and left temporal lobe and blood in the lateral
ventricles.
2. Scattered areas of nonhemorrhagic areas of low attenuation
suggest vasogenic edema potentially associated with metastatic
disease. Patient has a history of melanoma and metastatic melanoma
could produce multiple hemorrhagic metastases. Other differential
considerations include hemorrhagic infarction, hypertensive
hemorrhage and amyloid angiopathy.
3. Followup MRI with the without contrast is recommended to further
assess the cause of hemorrhage.
4. Critical Value/emergent results were called by telephone at the
time of interpretation on 05/13/2013 at [DATE] to Dr. ANTOR
DANII , who verbally acknowledged these results.

## 2015-04-08 IMAGING — CR DG CHEST 1V PORT
1 series · 1 of 1 positions shown · non-contrast
Comparison: DG CHEST 2 VIEW dated 05/02/2013

CLINICAL DATA: Seizure.

EXAM:
PORTABLE CHEST - 1 VIEW

[AP]
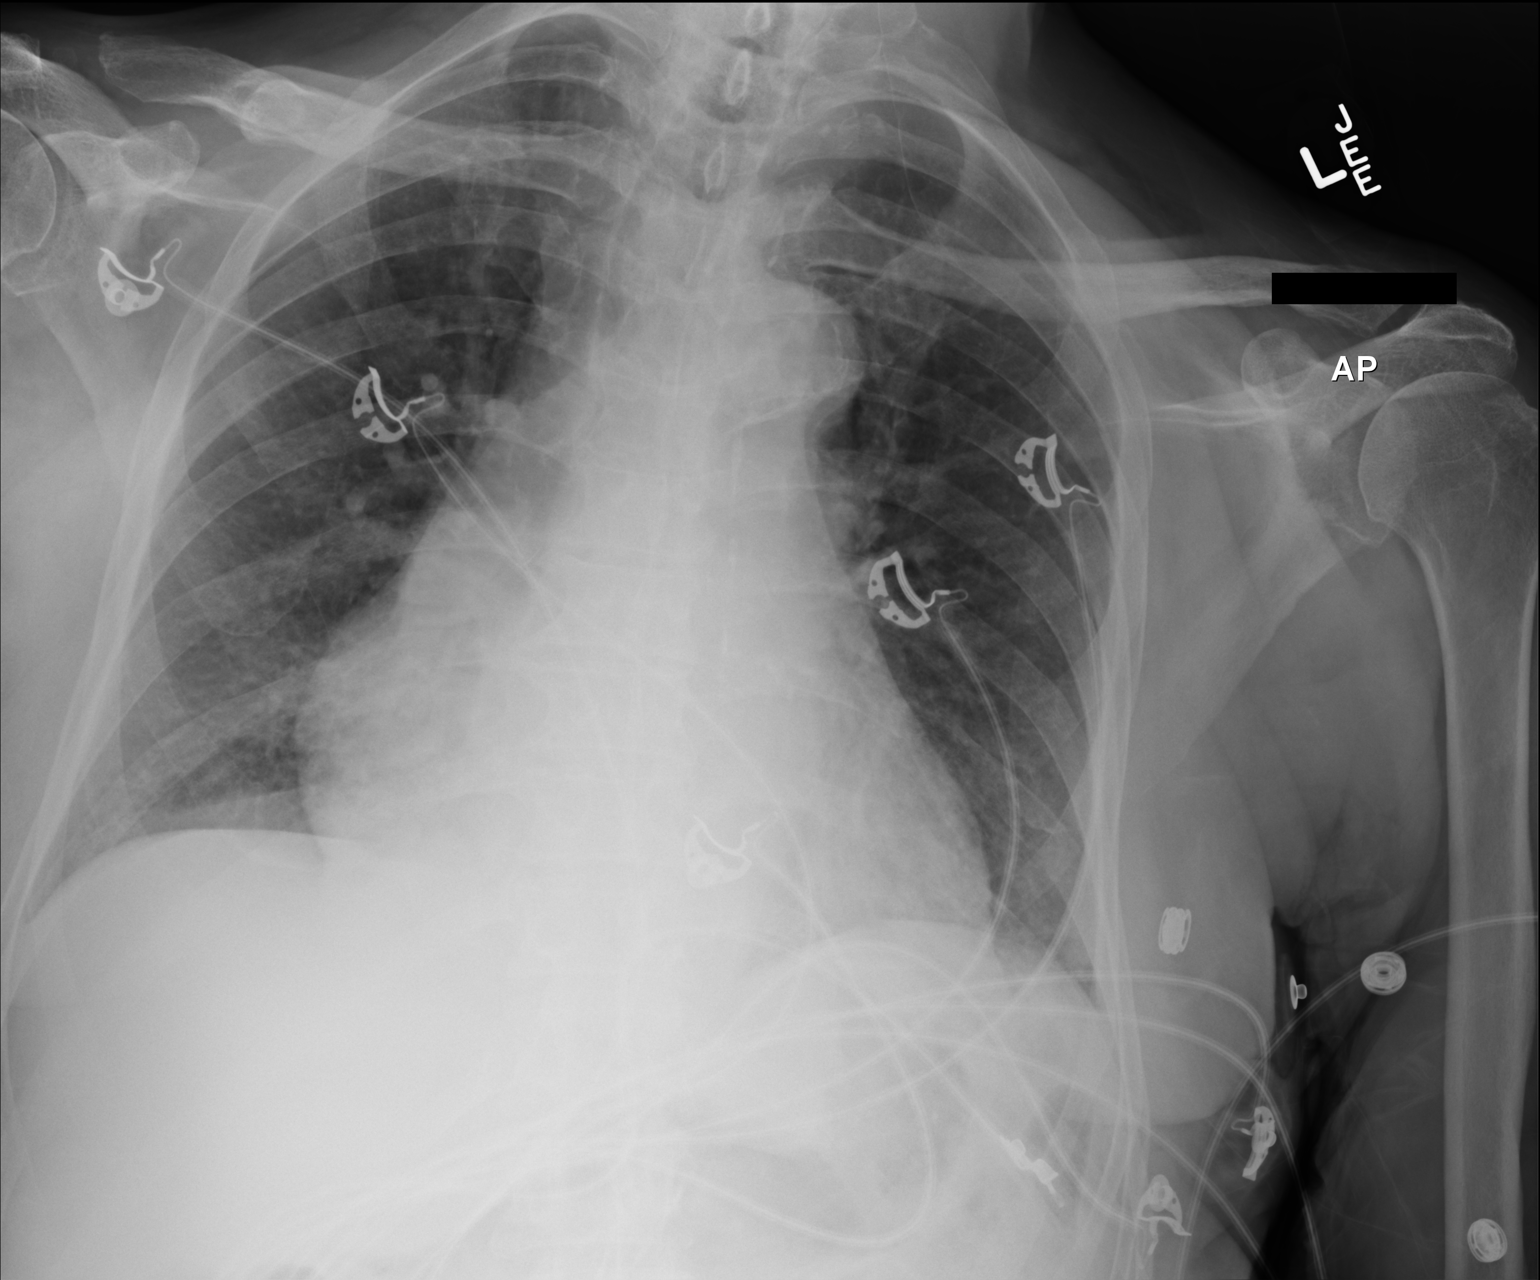

[1 of 1 positions shown; findings below may reference images not displayed]

FINDINGS: Cardiomegaly. Tortuous thoracic aorta. Aortic arch atherosclerosis.
Monitoring leads project over the chest. Mild basilar atelectasis is
present. No airspace disease. No effusion. Resection of the distal
right clavicle.
IMPRESSION: Cardiomegaly and low lung volumes.  No acute abnormality.

## 2015-04-10 IMAGING — CR DG CHEST 1V PORT
1 series · 1 of 1 positions shown · non-contrast
Comparison: Portable exam 8546 hr compared to 05/13/2013

CLINICAL DATA: Intubation, evaluate endotracheal tube position

EXAM:
PORTABLE CHEST - 1 VIEW

[AP]
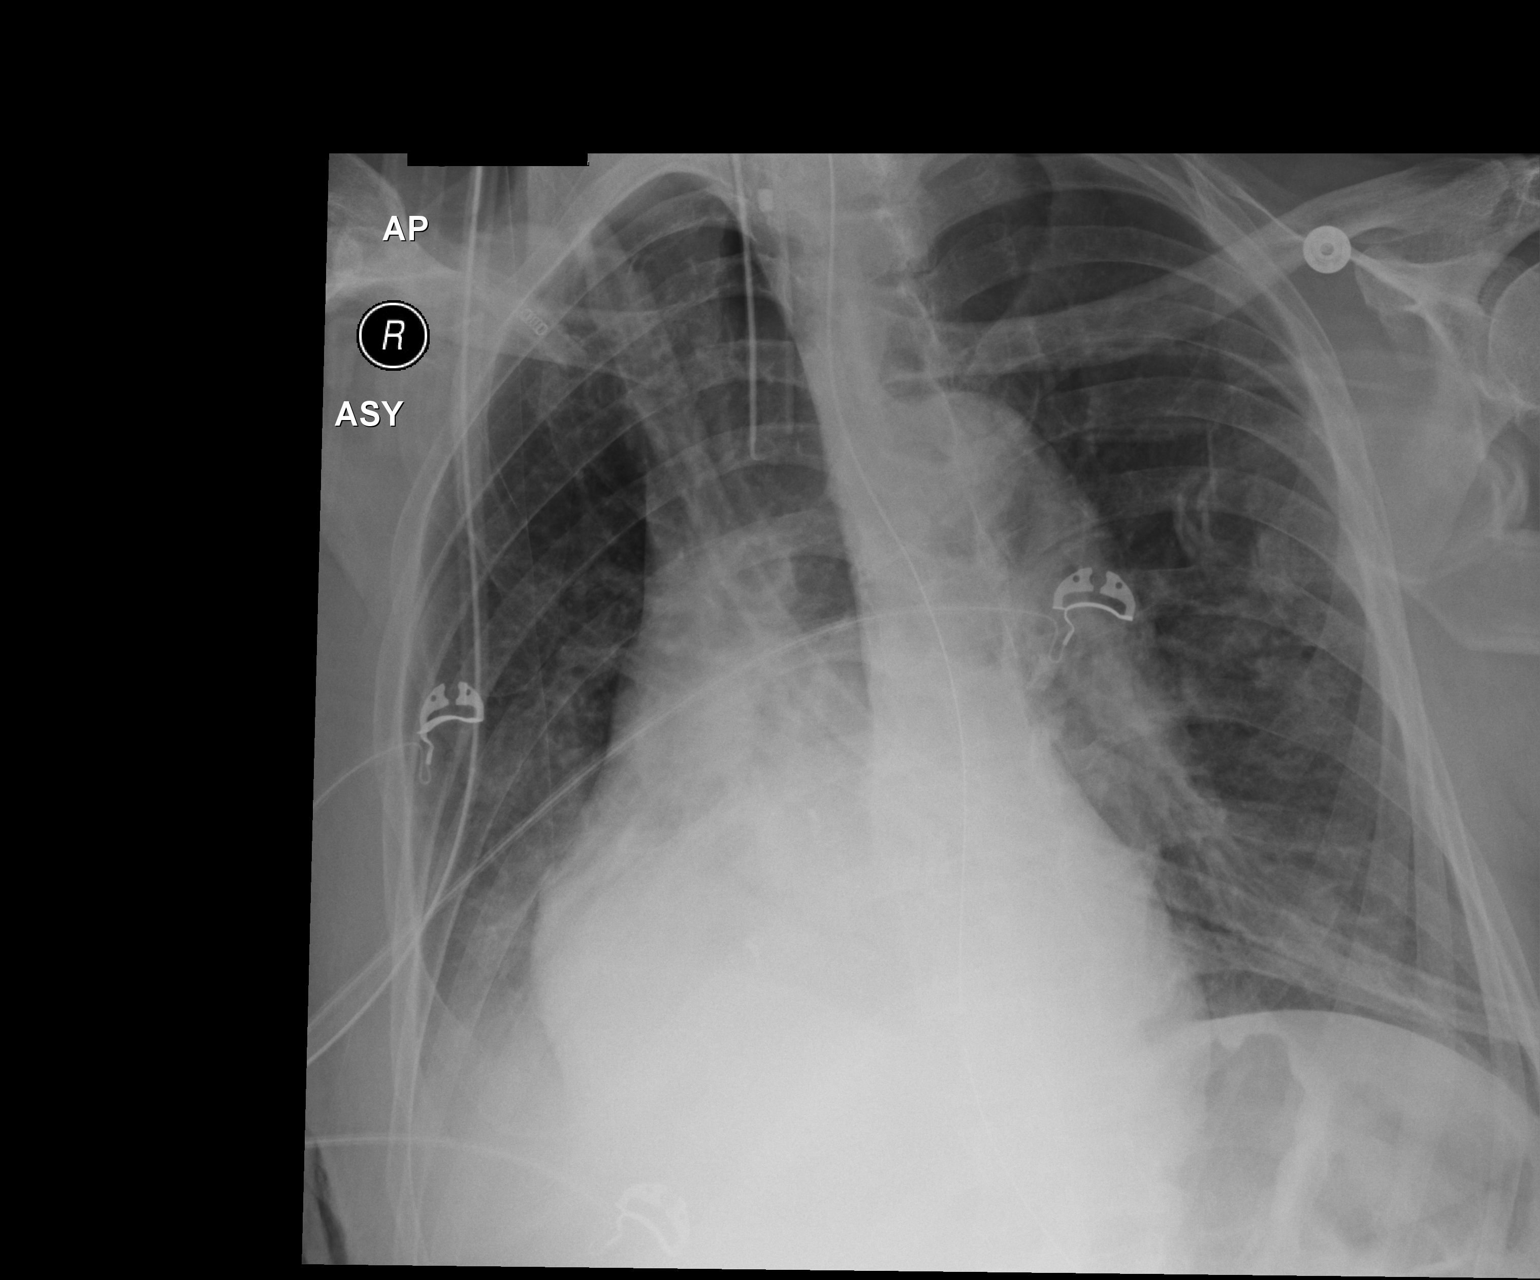

[1 of 1 positions shown; findings below may reference images not displayed]

FINDINGS: Rotated to the RIGHT.

Tip of endotracheal tube projects 4.8 cm above carina.

Nasogastric tube extends into stomach.

Enlargement of cardiac silhouette.

Mediastinal contours and pulmonary vascularity normal.

RIGHT basilar atelectasis versus consolidation.

Remaining lungs clear.

No pleural effusion or pneumothorax.
IMPRESSION: Atelectasis versus consolidation at RIGHT lower lobe.
